# Patient Record
Sex: Male | Born: 1970 | Race: White | Hispanic: No | Marital: Single | Smoking: Former smoker
Health system: Southern US, Community
[De-identification: ages and names within clinical notes are randomized; demographics above are authoritative.]

## PROBLEM LIST (undated history)

## (undated) DIAGNOSIS — J45909 Unspecified asthma, uncomplicated: Secondary | ICD-10-CM

## (undated) DIAGNOSIS — I509 Heart failure, unspecified: Secondary | ICD-10-CM

## (undated) DIAGNOSIS — G43909 Migraine, unspecified, not intractable, without status migrainosus: Secondary | ICD-10-CM

## (undated) DIAGNOSIS — I10 Essential (primary) hypertension: Secondary | ICD-10-CM

## (undated) DIAGNOSIS — I5030 Unspecified diastolic (congestive) heart failure: Secondary | ICD-10-CM

## (undated) DIAGNOSIS — K219 Gastro-esophageal reflux disease without esophagitis: Secondary | ICD-10-CM

## (undated) DIAGNOSIS — F329 Major depressive disorder, single episode, unspecified: Secondary | ICD-10-CM

## (undated) DIAGNOSIS — G629 Polyneuropathy, unspecified: Secondary | ICD-10-CM

## (undated) DIAGNOSIS — E119 Type 2 diabetes mellitus without complications: Secondary | ICD-10-CM

## (undated) DIAGNOSIS — F419 Anxiety disorder, unspecified: Secondary | ICD-10-CM

## (undated) DIAGNOSIS — R12 Heartburn: Secondary | ICD-10-CM

## (undated) DIAGNOSIS — E785 Hyperlipidemia, unspecified: Secondary | ICD-10-CM

## (undated) DIAGNOSIS — G4730 Sleep apnea, unspecified: Secondary | ICD-10-CM

## (undated) DIAGNOSIS — F32A Depression, unspecified: Secondary | ICD-10-CM

## (undated) DIAGNOSIS — N200 Calculus of kidney: Secondary | ICD-10-CM

## (undated) HISTORY — DX: Hyperlipidemia, unspecified: E78.5

## (undated) HISTORY — DX: Heartburn: R12

## (undated) HISTORY — DX: Calculus of kidney: N20.0

## (undated) HISTORY — PX: HERNIA REPAIR: SHX51

## (undated) HISTORY — DX: Polyneuropathy, unspecified: G62.9

## (undated) HISTORY — DX: Gastro-esophageal reflux disease without esophagitis: K21.9

## (undated) HISTORY — DX: Anxiety disorder, unspecified: F41.9

## (undated) HISTORY — DX: Unspecified asthma, uncomplicated: J45.909

## (undated) HISTORY — DX: Sleep apnea, unspecified: G47.30

---

## 2005-05-21 ENCOUNTER — Emergency Department: Payer: Self-pay | Admitting: Emergency Medicine

## 2005-09-15 ENCOUNTER — Emergency Department: Payer: Self-pay | Admitting: Emergency Medicine

## 2006-04-01 ENCOUNTER — Other Ambulatory Visit: Payer: Self-pay

## 2006-04-01 ENCOUNTER — Emergency Department: Payer: Self-pay | Admitting: Internal Medicine

## 2007-01-15 ENCOUNTER — Emergency Department: Payer: Self-pay | Admitting: Emergency Medicine

## 2007-02-22 ENCOUNTER — Emergency Department: Payer: Self-pay | Admitting: Emergency Medicine

## 2007-06-20 HISTORY — PX: KNEE SURGERY: SHX244

## 2007-11-19 ENCOUNTER — Emergency Department: Payer: Self-pay | Admitting: Emergency Medicine

## 2008-06-08 ENCOUNTER — Emergency Department: Payer: Self-pay | Admitting: Emergency Medicine

## 2008-09-04 ENCOUNTER — Emergency Department: Payer: Self-pay | Admitting: Emergency Medicine

## 2008-11-24 ENCOUNTER — Emergency Department: Payer: Self-pay | Admitting: Emergency Medicine

## 2009-01-13 ENCOUNTER — Emergency Department: Payer: Self-pay | Admitting: Emergency Medicine

## 2009-03-05 ENCOUNTER — Emergency Department (HOSPITAL_COMMUNITY): Admission: EM | Admit: 2009-03-05 | Discharge: 2009-03-06 | Payer: Self-pay | Admitting: Emergency Medicine

## 2009-03-05 ENCOUNTER — Encounter: Payer: Self-pay | Admitting: Orthopedic Surgery

## 2009-03-09 ENCOUNTER — Ambulatory Visit: Payer: Self-pay | Admitting: Orthopedic Surgery

## 2009-03-09 DIAGNOSIS — S62319A Displaced fracture of base of unspecified metacarpal bone, initial encounter for closed fracture: Secondary | ICD-10-CM | POA: Insufficient documentation

## 2009-05-05 ENCOUNTER — Encounter: Payer: Self-pay | Admitting: Orthopedic Surgery

## 2009-05-05 ENCOUNTER — Telehealth: Payer: Self-pay | Admitting: Orthopedic Surgery

## 2010-09-23 LAB — URINE MICROSCOPIC-ADD ON

## 2010-09-23 LAB — URINALYSIS, ROUTINE W REFLEX MICROSCOPIC
Glucose, UA: >1000 mg/dL — AB
Hgb urine dipstick: NEGATIVE
Specific Gravity, Urine: <1.005 — ABNORMAL LOW (ref 1.005–1.030)
pH: 5.5 (ref 5.0–8.0)

## 2010-09-23 LAB — GLUCOSE, CAPILLARY
Glucose-Capillary: 270 mg/dL — ABNORMAL HIGH (ref 70–99)
Glucose-Capillary: 325 mg/dL — ABNORMAL HIGH (ref 70–99)
Glucose-Capillary: 359 mg/dL — ABNORMAL HIGH (ref 70–99)

## 2010-09-23 LAB — CBC
MCV: 90.9 fL (ref 78.0–100.0)
RBC: 4.84 MIL/uL (ref 4.22–5.81)
WBC: 10.3 10*3/uL (ref 4.0–10.5)

## 2010-09-23 LAB — DIFFERENTIAL
Lymphocytes Relative: 37 % (ref 12–46)
Lymphs Abs: 3.8 10*3/uL (ref 0.7–4.0)
Monocytes Relative: 5 % (ref 3–12)
Neutro Abs: 5.8 10*3/uL (ref 1.7–7.7)
Neutrophils Relative %: 56 % (ref 43–77)

## 2010-09-23 LAB — BASIC METABOLIC PANEL
Calcium: 9.3 mg/dL (ref 8.4–10.5)
Chloride: 90 mEq/L — ABNORMAL LOW (ref 96–112)
Creatinine, Ser: 1.07 mg/dL (ref 0.4–1.5)
GFR calc Af Amer: >60 mL/min (ref >60)

## 2010-11-25 ENCOUNTER — Emergency Department: Payer: Self-pay | Admitting: Emergency Medicine

## 2011-06-29 ENCOUNTER — Emergency Department: Payer: Self-pay | Admitting: *Deleted

## 2011-08-30 ENCOUNTER — Emergency Department: Payer: Self-pay | Admitting: Internal Medicine

## 2011-08-30 LAB — URINALYSIS, COMPLETE
Ketone: NEGATIVE
Leukocyte Esterase: NEGATIVE
Nitrite: NEGATIVE
Protein: NEGATIVE
RBC,UR: <1 /HPF (ref 0–5)
WBC UR: 2 /HPF (ref 0–5)

## 2011-08-30 LAB — COMPREHENSIVE METABOLIC PANEL
Albumin: 3.9 g/dL (ref 3.4–5.0)
Alkaline Phosphatase: 89 U/L (ref 50–136)
Anion Gap: 11 (ref 7–16)
BUN: 12 mg/dL (ref 7–18)
Bilirubin,Total: 0.4 mg/dL (ref 0.2–1.0)
Calcium, Total: 8.6 mg/dL (ref 8.5–10.1)
Creatinine: 1.16 mg/dL (ref 0.60–1.30)
EGFR (African American): >60
Glucose: 88 mg/dL (ref 65–99)
Potassium: 3.8 mmol/L (ref 3.5–5.1)
Sodium: 139 mmol/L (ref 136–145)
Total Protein: 7.8 g/dL (ref 6.4–8.2)

## 2011-08-30 LAB — CBC
HCT: 45 % (ref 40.0–52.0)
HGB: 15.4 g/dL (ref 13.0–18.0)
MCV: 94 fL (ref 80–100)
WBC: 11.2 10*3/uL — ABNORMAL HIGH (ref 3.8–10.6)

## 2011-08-30 LAB — RAPID INFLUENZA A&B ANTIGENS

## 2011-09-05 LAB — CULTURE, BLOOD (SINGLE)

## 2011-11-08 ENCOUNTER — Emergency Department: Payer: Self-pay | Admitting: *Deleted

## 2011-11-08 LAB — TROPONIN I: Troponin-I: <0.02 ng/mL

## 2011-11-08 LAB — CBC
HGB: 16 g/dL (ref 13.0–18.0)
MCH: 31.9 pg (ref 26.0–34.0)
MCV: 92 fL (ref 80–100)
RBC: 5 10*6/uL (ref 4.40–5.90)
RDW: 12.8 % (ref 11.5–14.5)
WBC: 12.4 10*3/uL — ABNORMAL HIGH (ref 3.8–10.6)

## 2011-11-08 LAB — BASIC METABOLIC PANEL
Anion Gap: 9 (ref 7–16)
Calcium, Total: 8.5 mg/dL (ref 8.5–10.1)
Chloride: 105 mmol/L (ref 98–107)
Co2: 25 mmol/L (ref 21–32)
EGFR (African American): >60
Osmolality: 281 (ref 275–301)

## 2011-11-08 LAB — CK TOTAL AND CKMB (NOT AT ARMC)
CK, Total: 187 U/L (ref 35–232)
CK-MB: 0.9 ng/mL (ref 0.5–3.6)

## 2011-11-25 ENCOUNTER — Emergency Department: Payer: Self-pay | Admitting: Emergency Medicine

## 2011-11-25 LAB — CBC
HCT: 48.1 % (ref 40.0–52.0)
MCHC: 34.4 g/dL (ref 32.0–36.0)
MCV: 94 fL (ref 80–100)
RBC: 5.15 10*6/uL (ref 4.40–5.90)
RDW: 13.5 % (ref 11.5–14.5)
WBC: 13.5 10*3/uL — ABNORMAL HIGH (ref 3.8–10.6)

## 2011-11-25 LAB — URINALYSIS, COMPLETE
Bacteria: NONE SEEN
Bilirubin,UR: NEGATIVE
Glucose,UR: NEGATIVE mg/dL (ref 0–75)
Ketone: NEGATIVE
Nitrite: NEGATIVE
Ph: 5 (ref 4.5–8.0)
RBC,UR: 3 /HPF (ref 0–5)
Specific Gravity: 1.021 (ref 1.003–1.030)
WBC UR: 5 /HPF (ref 0–5)

## 2011-11-26 LAB — COMPREHENSIVE METABOLIC PANEL
Alkaline Phosphatase: 115 U/L (ref 50–136)
BUN: 12 mg/dL (ref 7–18)
Creatinine: 1.28 mg/dL (ref 0.60–1.30)
Osmolality: 278 (ref 275–301)
Sodium: 139 mmol/L (ref 136–145)

## 2011-11-26 LAB — CK TOTAL AND CKMB (NOT AT ARMC): CK-MB: <0.5 ng/mL — ABNORMAL LOW (ref 0.5–3.6)

## 2012-01-28 ENCOUNTER — Emergency Department: Payer: Self-pay | Admitting: Emergency Medicine

## 2012-01-28 LAB — CBC WITH DIFFERENTIAL/PLATELET
Basophil %: 1.3 %
Eosinophil %: 1.9 %
HCT: 51.2 % (ref 40.0–52.0)
HGB: 17.7 g/dL (ref 13.0–18.0)
Lymphocyte %: 39.8 %
Neutrophil #: 5.4 10*3/uL (ref 1.4–6.5)
Neutrophil %: 50 %
RBC: 5.57 10*6/uL (ref 4.40–5.90)
WBC: 10.8 10*3/uL — ABNORMAL HIGH (ref 3.8–10.6)

## 2012-01-28 LAB — COMPREHENSIVE METABOLIC PANEL
Bilirubin,Total: 0.3 mg/dL (ref 0.2–1.0)
Calcium, Total: 8.9 mg/dL (ref 8.5–10.1)
Chloride: 103 mmol/L (ref 98–107)
EGFR (African American): >60
EGFR (Non-African Amer.): >60
Glucose: 142 mg/dL — ABNORMAL HIGH (ref 65–99)
Osmolality: 277 (ref 275–301)
Potassium: 3.9 mmol/L (ref 3.5–5.1)
SGOT(AST): 31 U/L (ref 15–37)
SGPT (ALT): 43 U/L (ref 12–78)
Total Protein: 8.6 g/dL — ABNORMAL HIGH (ref 6.4–8.2)

## 2012-04-17 ENCOUNTER — Ambulatory Visit: Payer: Self-pay

## 2012-06-10 IMAGING — CR DG KNEE COMPLETE 4+V*R*
1 series · 5 of 5 positions shown · non-contrast
Comparison: none

REASON FOR EXAM: pain lateral/posterior s/p Squatting and felt "pop"
COMMENTS:   May transport without cardiac monitor

PROCEDURE:     DXR - DXR KNEE RT COMP WITH OBLIQUES  - June 29, 2011 [DATE]
RESULT:     No fracture, dislocation or other acute bony abnormality is
identified. The knee joint space is well maintained. The patella is intact.

[Series 1: t knee ap right · 0.14mm/px · 5 of 5 slices shown]
[im 1/5]
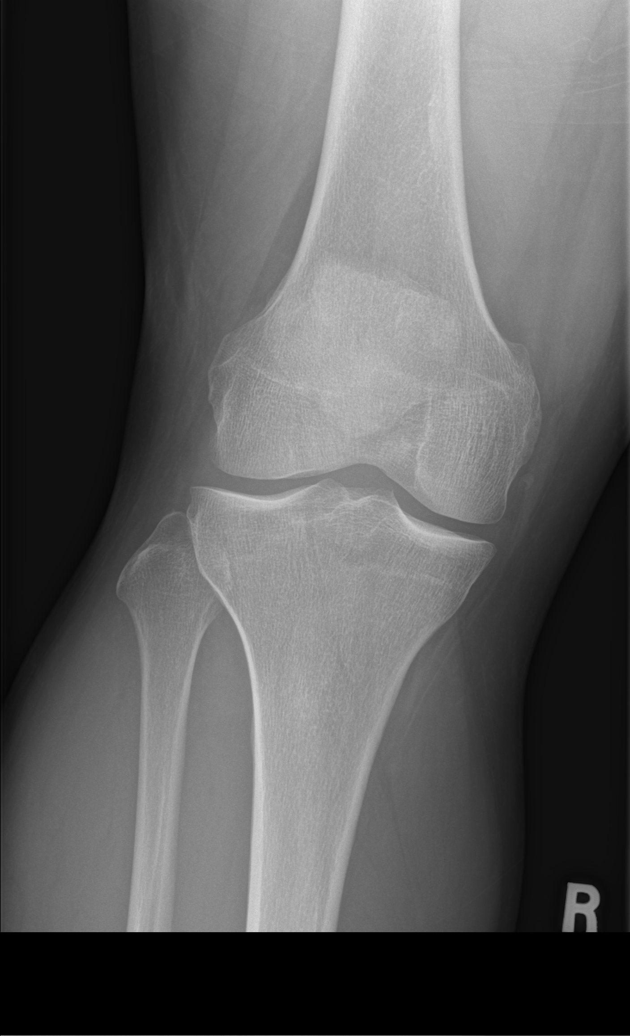
[im 2/5]
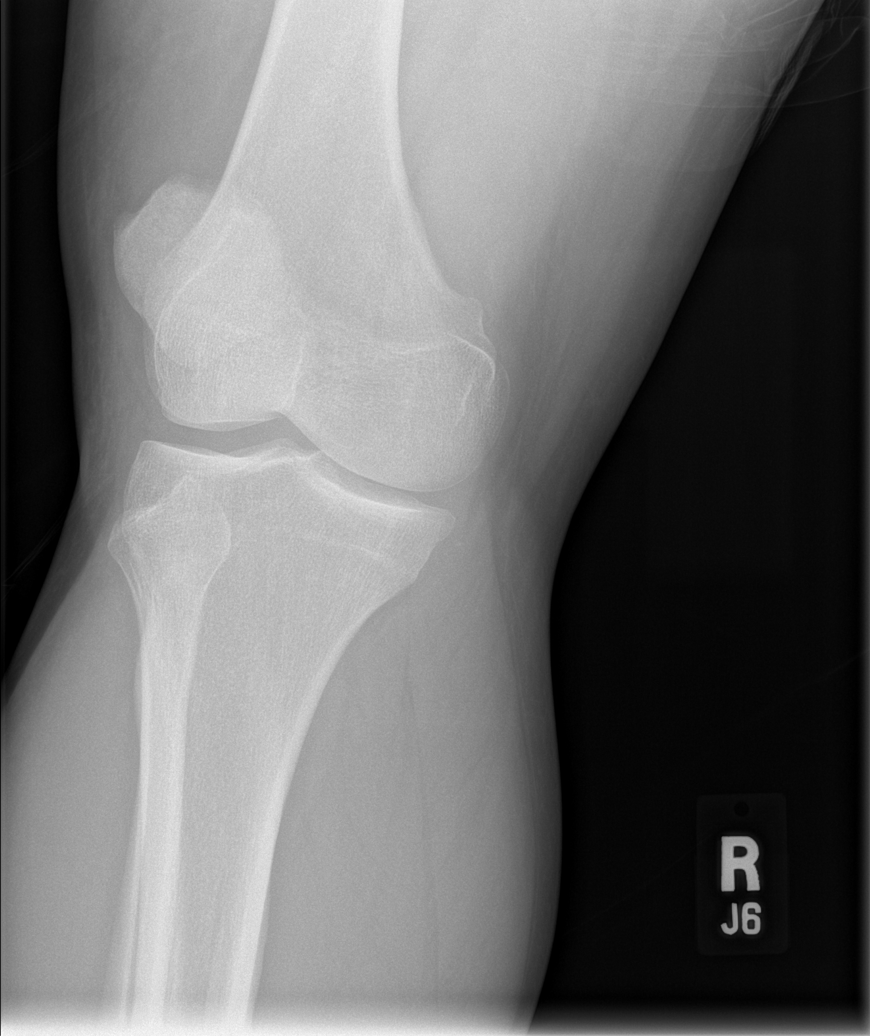
[im 3/5]
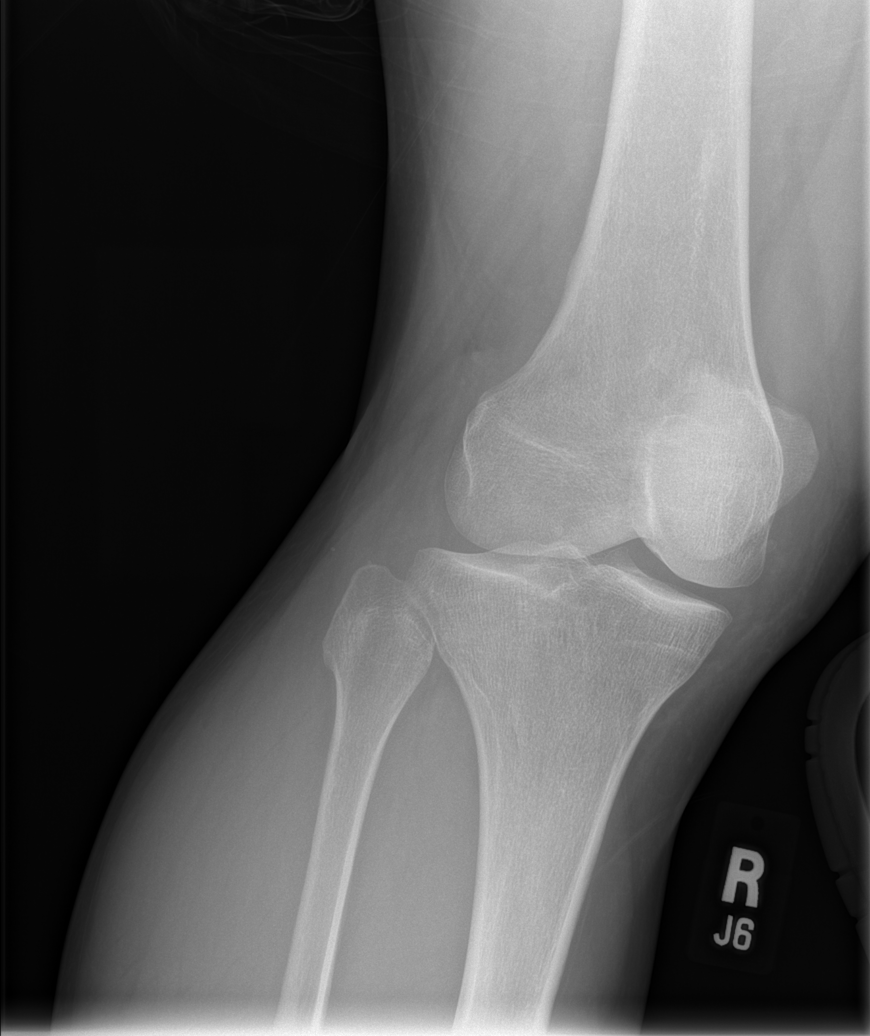
[im 4/5]
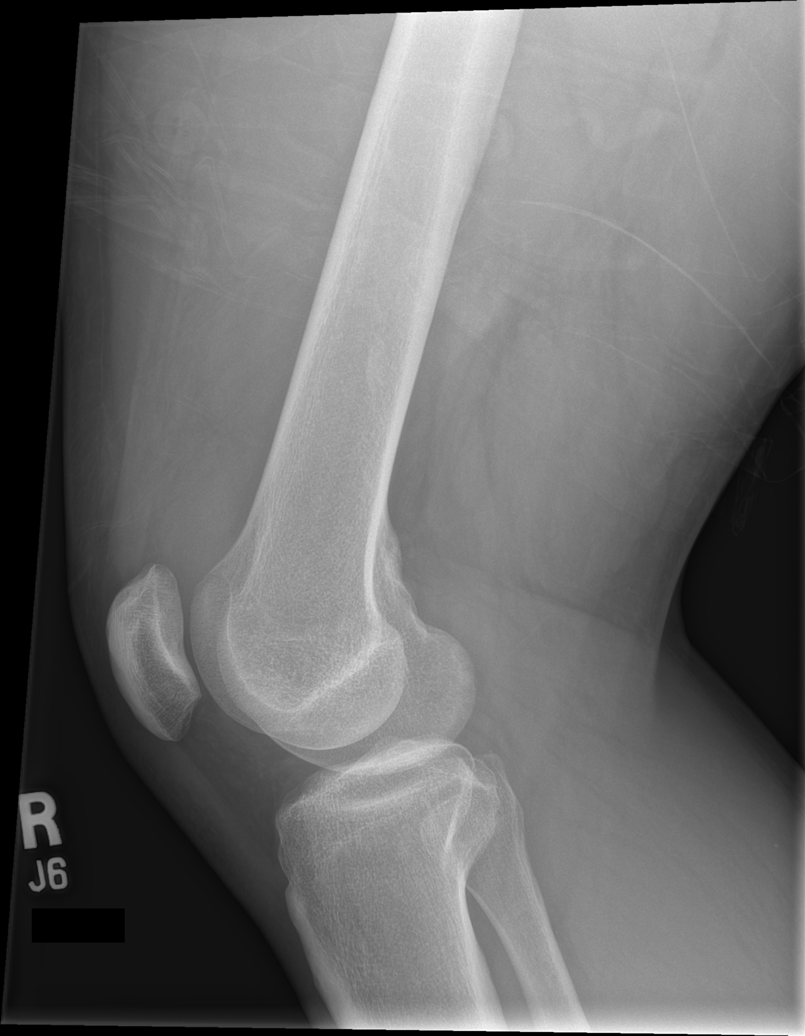
[im 5/5]
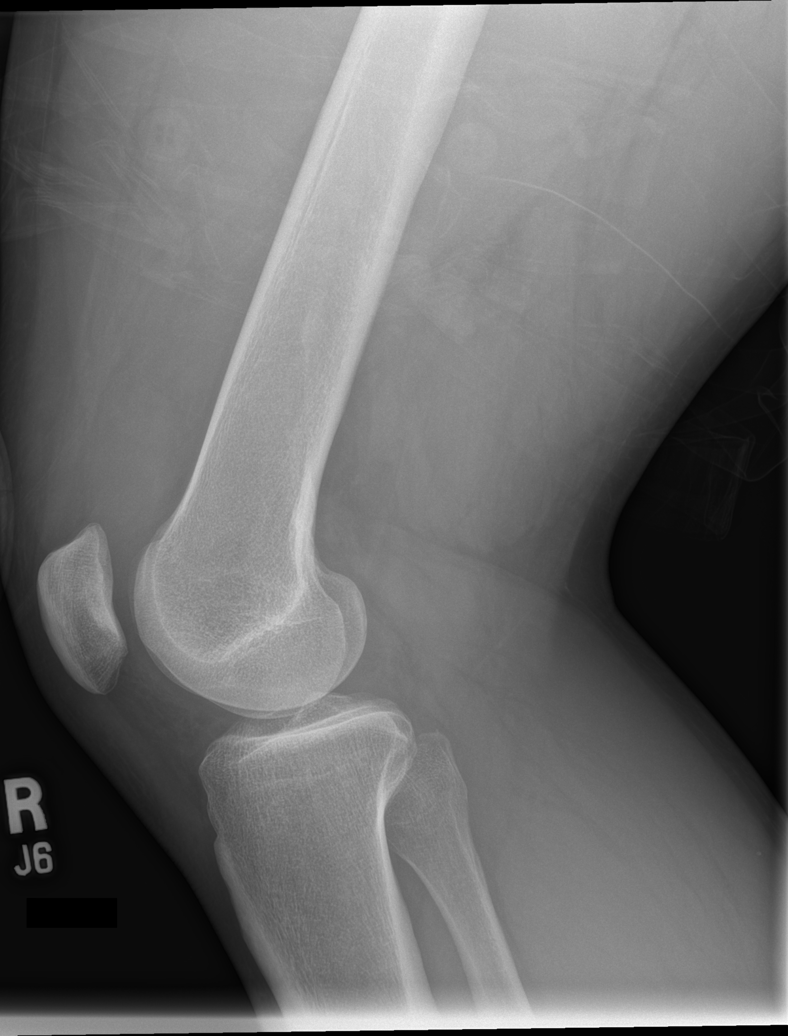

[5 of 5 positions shown; findings below may reference images not displayed]

IMPRESSION: 1.     No significant abnormalities are noted.

## 2012-09-19 ENCOUNTER — Emergency Department: Payer: Self-pay | Admitting: Emergency Medicine

## 2012-09-19 LAB — URINALYSIS, COMPLETE
Bilirubin,UR: NEGATIVE
Blood: NEGATIVE
Glucose,UR: >=500 mg/dL (ref 0–75)
Leukocyte Esterase: NEGATIVE
Specific Gravity: 1.035 (ref 1.003–1.030)
Squamous Epithelial: NONE SEEN

## 2012-09-19 LAB — COMPREHENSIVE METABOLIC PANEL
Albumin: 3.9 g/dL (ref 3.4–5.0)
Alkaline Phosphatase: 152 U/L — ABNORMAL HIGH (ref 50–136)
BUN: 16 mg/dL (ref 7–18)
Creatinine: 1.41 mg/dL — ABNORMAL HIGH (ref 0.60–1.30)
EGFR (Non-African Amer.): >60
Osmolality: 278 (ref 275–301)
Potassium: 3.9 mmol/L (ref 3.5–5.1)
SGOT(AST): 22 U/L (ref 15–37)
SGPT (ALT): 32 U/L (ref 12–78)
Sodium: 128 mmol/L — ABNORMAL LOW (ref 136–145)

## 2012-09-19 LAB — CBC WITH DIFFERENTIAL/PLATELET
Basophil #: 0 10*3/uL (ref 0.0–0.1)
Basophil %: 0.3 %
HGB: 17.7 g/dL (ref 13.0–18.0)
MCH: 31.8 pg (ref 26.0–34.0)
MCHC: 35 g/dL (ref 32.0–36.0)
Monocyte #: 0.6 x10 3/mm (ref 0.2–1.0)
Monocyte %: 4.2 %
Neutrophil #: 13.5 10*3/uL — ABNORMAL HIGH (ref 1.4–6.5)
Platelet: 197 10*3/uL (ref 150–440)
RBC: 5.58 10*6/uL (ref 4.40–5.90)
RDW: 12.4 % (ref 11.5–14.5)

## 2013-02-19 ENCOUNTER — Emergency Department: Payer: Self-pay | Admitting: Emergency Medicine

## 2013-02-19 LAB — BASIC METABOLIC PANEL
Creatinine: 1.38 mg/dL — ABNORMAL HIGH (ref 0.60–1.30)
EGFR (African American): >60
Glucose: 138 mg/dL — ABNORMAL HIGH (ref 65–99)
Osmolality: 274 (ref 275–301)
Potassium: 3.6 mmol/L (ref 3.5–5.1)

## 2013-02-19 LAB — CBC
HGB: 15.8 g/dL (ref 13.0–18.0)
MCHC: 35.2 g/dL (ref 32.0–36.0)
Platelet: 221 10*3/uL (ref 150–440)
RBC: 4.97 10*6/uL (ref 4.40–5.90)
WBC: 10.4 10*3/uL (ref 3.8–10.6)

## 2013-02-19 LAB — URINALYSIS, COMPLETE
Bacteria: NONE SEEN
Glucose,UR: NEGATIVE mg/dL (ref 0–75)
Ketone: NEGATIVE
Leukocyte Esterase: NEGATIVE
Nitrite: NEGATIVE
Squamous Epithelial: NONE SEEN

## 2013-03-28 ENCOUNTER — Ambulatory Visit: Payer: Self-pay | Admitting: Surgery

## 2013-03-28 LAB — COMPREHENSIVE METABOLIC PANEL
Albumin: 3.7 g/dL (ref 3.4–5.0)
Alkaline Phosphatase: 96 U/L (ref 50–136)
BUN: 13 mg/dL (ref 7–18)
Co2: 26 mmol/L (ref 21–32)
Creatinine: 1.19 mg/dL (ref 0.60–1.30)
EGFR (Non-African Amer.): >60
Potassium: 3.9 mmol/L (ref 3.5–5.1)
Sodium: 135 mmol/L — ABNORMAL LOW (ref 136–145)
Total Protein: 7 g/dL (ref 6.4–8.2)

## 2013-03-28 LAB — CBC WITH DIFFERENTIAL/PLATELET
Basophil %: 1 %
Eosinophil #: 0.2 10*3/uL (ref 0.0–0.7)
Eosinophil %: 2 %
HCT: 44.2 % (ref 40.0–52.0)
HGB: 15.5 g/dL (ref 13.0–18.0)
Lymphocyte %: 43.8 %
MCH: 31.5 pg (ref 26.0–34.0)
MCHC: 35.1 g/dL (ref 32.0–36.0)
Monocyte #: 0.6 x10 3/mm (ref 0.2–1.0)
Neutrophil #: 4.2 10*3/uL (ref 1.4–6.5)
Neutrophil %: 46.5 %
Platelet: 205 10*3/uL (ref 150–440)
RDW: 12.9 % (ref 11.5–14.5)

## 2013-03-31 ENCOUNTER — Ambulatory Visit: Payer: Self-pay | Admitting: Surgery

## 2013-04-03 ENCOUNTER — Other Ambulatory Visit: Payer: Self-pay | Admitting: Surgery

## 2013-04-03 LAB — URINALYSIS, COMPLETE
Glucose,UR: 50 mg/dL (ref 0–75)
Ketone: NEGATIVE
Leukocyte Esterase: NEGATIVE
Nitrite: NEGATIVE
RBC,UR: <1 /HPF (ref 0–5)
WBC UR: 1 /HPF (ref 0–5)

## 2013-06-19 ENCOUNTER — Emergency Department: Payer: Self-pay | Admitting: Emergency Medicine

## 2013-06-19 LAB — CBC
HCT: 48.9 % (ref 40.0–52.0)
HGB: 16.7 g/dL (ref 13.0–18.0)
MCH: 30.7 pg (ref 26.0–34.0)
MCHC: 34.3 g/dL (ref 32.0–36.0)
MCV: 90 fL (ref 80–100)
PLATELETS: 265 10*3/uL (ref 150–440)
RBC: 5.45 10*6/uL (ref 4.40–5.90)
RDW: 12.9 % (ref 11.5–14.5)
WBC: 11.4 10*3/uL — ABNORMAL HIGH (ref 3.8–10.6)

## 2013-06-19 LAB — URINALYSIS, COMPLETE
BACTERIA: NONE SEEN
BILIRUBIN, UR: NEGATIVE
Glucose,UR: NEGATIVE mg/dL (ref 0–75)
Hyaline Cast: 1
Leukocyte Esterase: NEGATIVE
NITRITE: NEGATIVE
Ph: 5 (ref 4.5–8.0)
Protein: NEGATIVE
RBC,UR: 8 /HPF (ref 0–5)
Specific Gravity: 1.016 (ref 1.003–1.030)
Squamous Epithelial: NONE SEEN

## 2013-06-19 LAB — COMPREHENSIVE METABOLIC PANEL
ALK PHOS: 96 U/L
AST: 31 U/L (ref 15–37)
Albumin: 4.4 g/dL (ref 3.4–5.0)
Anion Gap: 5 — ABNORMAL LOW (ref 7–16)
BUN: 22 mg/dL — AB (ref 7–18)
Bilirubin,Total: 0.7 mg/dL (ref 0.2–1.0)
CHLORIDE: 101 mmol/L (ref 98–107)
CO2: 28 mmol/L (ref 21–32)
Calcium, Total: 9.4 mg/dL (ref 8.5–10.1)
Creatinine: 1.5 mg/dL — ABNORMAL HIGH (ref 0.60–1.30)
GFR CALC NON AF AMER: 57 — AB
Glucose: 100 mg/dL — ABNORMAL HIGH (ref 65–99)
OSMOLALITY: 272 (ref 275–301)
Potassium: 3.9 mmol/L (ref 3.5–5.1)
SGPT (ALT): 38 U/L (ref 12–78)
Sodium: 134 mmol/L — ABNORMAL LOW (ref 136–145)
TOTAL PROTEIN: 8.6 g/dL — AB (ref 6.4–8.2)

## 2013-06-19 LAB — LIPASE, BLOOD: Lipase: 170 U/L (ref 73–393)

## 2013-07-11 ENCOUNTER — Emergency Department: Payer: Self-pay | Admitting: Internal Medicine

## 2013-11-30 ENCOUNTER — Emergency Department: Payer: Self-pay | Admitting: Internal Medicine

## 2013-11-30 LAB — LIPASE, BLOOD: Lipase: 243 U/L (ref 73–393)

## 2013-11-30 LAB — COMPREHENSIVE METABOLIC PANEL
ALBUMIN: 3.9 g/dL (ref 3.4–5.0)
AST: 20 U/L (ref 15–37)
Alkaline Phosphatase: 111 U/L
Anion Gap: 6 — ABNORMAL LOW (ref 7–16)
BILIRUBIN TOTAL: 0.2 mg/dL (ref 0.2–1.0)
BUN: 17 mg/dL (ref 7–18)
CALCIUM: 8.7 mg/dL (ref 8.5–10.1)
Chloride: 105 mmol/L (ref 98–107)
Co2: 25 mmol/L (ref 21–32)
Creatinine: 1.35 mg/dL — ABNORMAL HIGH (ref 0.60–1.30)
EGFR (Non-African Amer.): >60
Glucose: 197 mg/dL — ABNORMAL HIGH (ref 65–99)
Osmolality: 279 (ref 275–301)
Potassium: 4 mmol/L (ref 3.5–5.1)
SGPT (ALT): 27 U/L (ref 12–78)
Sodium: 136 mmol/L (ref 136–145)
Total Protein: 7.7 g/dL (ref 6.4–8.2)

## 2013-11-30 LAB — CBC
HCT: 49.8 % (ref 40.0–52.0)
HGB: 17 g/dL (ref 13.0–18.0)
MCH: 31.1 pg (ref 26.0–34.0)
MCHC: 34.1 g/dL (ref 32.0–36.0)
MCV: 91 fL (ref 80–100)
Platelet: 257 10*3/uL (ref 150–440)
RBC: 5.47 10*6/uL (ref 4.40–5.90)
RDW: 12.7 % (ref 11.5–14.5)
WBC: 9.8 10*3/uL (ref 3.8–10.6)

## 2013-11-30 LAB — TROPONIN I: Troponin-I: <0.02 ng/mL

## 2013-12-01 ENCOUNTER — Emergency Department: Payer: Self-pay | Admitting: Emergency Medicine

## 2013-12-01 LAB — URINALYSIS, COMPLETE
BACTERIA: NONE SEEN
BILIRUBIN, UR: NEGATIVE
GLUCOSE, UR: NEGATIVE mg/dL (ref 0–75)
Ketone: NEGATIVE
Leukocyte Esterase: NEGATIVE
Nitrite: NEGATIVE
Ph: 6 (ref 4.5–8.0)
Protein: NEGATIVE
RBC,UR: 24 /HPF (ref 0–5)
SPECIFIC GRAVITY: 1.012 (ref 1.003–1.030)
Squamous Epithelial: NONE SEEN

## 2014-03-03 ENCOUNTER — Emergency Department: Payer: Self-pay | Admitting: Emergency Medicine

## 2014-03-03 LAB — CBC
HCT: 45.4 % (ref 40.0–52.0)
HGB: 15.2 g/dL (ref 13.0–18.0)
MCH: 30.9 pg (ref 26.0–34.0)
MCHC: 33.6 g/dL (ref 32.0–36.0)
MCV: 92 fL (ref 80–100)
PLATELETS: 223 10*3/uL (ref 150–440)
RBC: 4.93 10*6/uL (ref 4.40–5.90)
RDW: 12.6 % (ref 11.5–14.5)
WBC: 7.8 10*3/uL (ref 3.8–10.6)

## 2014-03-03 LAB — COMPREHENSIVE METABOLIC PANEL
ALT: 33 U/L
Albumin: 3.7 g/dL (ref 3.4–5.0)
Alkaline Phosphatase: 107 U/L
Anion Gap: 7 (ref 7–16)
BILIRUBIN TOTAL: 0.3 mg/dL (ref 0.2–1.0)
BUN: 12 mg/dL (ref 7–18)
CHLORIDE: 107 mmol/L (ref 98–107)
CREATININE: 1.26 mg/dL (ref 0.60–1.30)
Calcium, Total: 8.4 mg/dL — ABNORMAL LOW (ref 8.5–10.1)
Co2: 25 mmol/L (ref 21–32)
EGFR (African American): >60
EGFR (Non-African Amer.): >60
Glucose: 123 mg/dL — ABNORMAL HIGH (ref 65–99)
Osmolality: 279 (ref 275–301)
Potassium: 4 mmol/L (ref 3.5–5.1)
SGOT(AST): 30 U/L (ref 15–37)
SODIUM: 139 mmol/L (ref 136–145)
Total Protein: 7.4 g/dL (ref 6.4–8.2)

## 2014-03-03 LAB — URINALYSIS, COMPLETE
BILIRUBIN, UR: NEGATIVE
Bacteria: NONE SEEN
Glucose,UR: NEGATIVE mg/dL (ref 0–75)
Ketone: NEGATIVE
Leukocyte Esterase: NEGATIVE
Nitrite: NEGATIVE
Ph: 5 (ref 4.5–8.0)
Protein: 100
Specific Gravity: 1.025 (ref 1.003–1.030)
WBC UR: 2 /HPF (ref 0–5)

## 2014-03-07 ENCOUNTER — Emergency Department: Payer: Self-pay

## 2014-03-07 LAB — COMPREHENSIVE METABOLIC PANEL
ALBUMIN: 4 g/dL (ref 3.4–5.0)
ALK PHOS: 102 U/L
AST: 28 U/L (ref 15–37)
Anion Gap: 10 (ref 7–16)
BILIRUBIN TOTAL: 0.4 mg/dL (ref 0.2–1.0)
BUN: 20 mg/dL — AB (ref 7–18)
CHLORIDE: 105 mmol/L (ref 98–107)
CO2: 23 mmol/L (ref 21–32)
CREATININE: 1.52 mg/dL — AB (ref 0.60–1.30)
Calcium, Total: 8.7 mg/dL (ref 8.5–10.1)
GFR CALC NON AF AMER: 55 — AB
Glucose: 248 mg/dL — ABNORMAL HIGH (ref 65–99)
Osmolality: 287 (ref 275–301)
Potassium: 3.8 mmol/L (ref 3.5–5.1)
SGPT (ALT): 30 U/L
Sodium: 138 mmol/L (ref 136–145)
Total Protein: 7.7 g/dL (ref 6.4–8.2)

## 2014-03-07 LAB — CBC WITH DIFFERENTIAL/PLATELET
BASOS PCT: 1.1 %
Basophil #: 0.1 10*3/uL (ref 0.0–0.1)
Eosinophil #: 0.1 10*3/uL (ref 0.0–0.7)
Eosinophil %: 0.7 %
HCT: 45.1 % (ref 40.0–52.0)
HGB: 15 g/dL (ref 13.0–18.0)
Lymphocyte #: 2.8 10*3/uL (ref 1.0–3.6)
Lymphocyte %: 36.9 %
MCH: 30.5 pg (ref 26.0–34.0)
MCHC: 33.2 g/dL (ref 32.0–36.0)
MCV: 92 fL (ref 80–100)
MONO ABS: 0.5 x10 3/mm (ref 0.2–1.0)
Monocyte %: 6.4 %
NEUTROS PCT: 54.9 %
Neutrophil #: 4.2 10*3/uL (ref 1.4–6.5)
Platelet: 239 10*3/uL (ref 150–440)
RBC: 4.91 10*6/uL (ref 4.40–5.90)
RDW: 12.8 % (ref 11.5–14.5)
WBC: 7.6 10*3/uL (ref 3.8–10.6)

## 2014-03-07 LAB — URINALYSIS, COMPLETE
BACTERIA: NONE SEEN
BILIRUBIN, UR: NEGATIVE
KETONE: NEGATIVE
LEUKOCYTE ESTERASE: NEGATIVE
Nitrite: NEGATIVE
Ph: 5 (ref 4.5–8.0)
Protein: 100
SQUAMOUS EPITHELIAL: NONE SEEN
Specific Gravity: 1.029 (ref 1.003–1.030)

## 2014-03-07 LAB — LIPASE, BLOOD: Lipase: 145 U/L (ref 73–393)

## 2014-06-17 ENCOUNTER — Emergency Department: Payer: Self-pay | Admitting: Emergency Medicine

## 2014-10-10 NOTE — Consult Note (Signed)
PATIENT NAME:  Dustin Williamson, BRULL MR#:  [id number] DATE OF BIRTH:  Apr 28, 1971  DATE OF CONSULTATION:  06/19/2013  REFERRING PHYSICIAN:   CONSULTING PHYSICIAN:  Quentin Ore III, MD  PRIMARY CARE PHYSICIAN: Dr. Juel Burrow.    CHIEF COMPLAINT: Left inguinal, left lower quadrant pain.   BRIEF HISTORY: Dustin Williamson is a 44 year old gentleman who underwent a left inguinal hernia repair in October of 2014. The procedure was performed by one of my associates, Dr. Dionne Milo. An extraperitoneal procedure was performed. The patient had large indirect and small direct defects with a large cord lipoma. The patient had some prolonged postoperative discomfort, with 2 followup visits with inguinal discomfort but his symptoms resolved and he required no further intervention. He has not been seen since November of 2014.   He noted the fairly sudden onset of left lower quadrant pain yesterday after a hard stool. The pain was primarily left lower quadrant, left groin. He had some tenderness palpation in the left groin. He denied any bulge or mass in that area. The pain actually went into his scrotum on that side. He began to develop pain along the medial aspect of his leg, all the way down into his foot with some numbness in his foot. He had some throbbing discomfort when he was standing. He came to the Emergency Room today for further evaluation because he is concerned about the possibility of a recurrent hernia or a complication from his hernia procedure.   He denies any other significant GI problems. He denies any history of hepatitis, yellow jaundice, pancreatitis, peptic ulcer disease, gallbladder disease or diverticulitis. He has not had any other stool problems recently. He has not had any colonoscopy or endoscopic workup. His only abdominal surgery was his left groin surgery. He has had some right knee surgery.   MEDICAL PROBLEMS: Involve hypertension, nephrolithiasis and adult onset diabetes with oral agents,  noninsulin dependent.   MEDICATIONS: Include propranolol 80 mg twice a day, Xanax 0.5 mg once a day, omeprazole 20 mg b.i.d. and metformin 1000 mg b.i.d.   ALLERGIES: He has no medical allergies.   REVIEW OF SYSTEMS: Otherwise unremarkable.   FAMILY HISTORY: Noncontributory.   LABORATORY VALUES: Reveal a slightly elevated creatinine at 1.5, slightly elevated BUN at 22, elevated white blood cell count at 11,400.   Because of this discomfort, the surgical service was consulted for further evaluation of possible recurrent hernia or hernia complication. No imaging has been performed.   PHYSICAL EXAMINATION:  GENERAL: He is an alert gentleman lying in bed, appears comfortable, although anxious. Pain scale is a 5.  VITAL SIGNS: His temperature is normal. Blood pressure 140/88. Heart rate is 84 and regular.  HEENT: No scleral icterus. No pupillary abnormalities. No facial deformities.  NECK: Supple, nontender with no adenopathy. Midline trachea.  CHEST: Has normal pulmonary excursion with no adventitious sounds.  CARDIAC: No murmurs or gallops. He seems to be in normal sinus rhythm.  ABDOMEN: Soft and nontender. He does have some point tenderness in the left inguinal area. He does not have any left lower quadrant tenderness. He has no rebound or guarding. Inverting his scrotum, I cannot palpate any inguinal defects, and there are no bulges or impulses noted in either groin. The scrotum is not enlarged, with no evidence of any swelling or discomfort.  LEFT LOWER EXTREMITY: His left lower extremity is otherwise unremarkable. He has normal sensation on exam. He has full range of motion. No deformities. Good distal pulse.  PSYCHIATRIC: Reveals  some mild anxiousness but no orientation issues.   IMPRESSION: I do not believe this gentleman has any symptoms consistent with a recurrent or persistent hernia. He has really not had any neurologic symptoms associated with his hernia, and I suspect that his  discomfort is related to a musculoskeletal or inflammatory issue. He does have symptoms in his entire leg which would not be consistent with a recurrent or persistent hernia. I would recommend treatment with pain medicine, anti-inflammatory drugs and cathartics for his bowel function. We would arrange for followup as an outpatient as necessary.   ____________________________ Quentin Orealph L. Ely III, MD rle:gb D: 06/19/2013 17:39:33 ET T: 06/19/2013 18:17:18 ET JOB#: [id number]  cc: Quentin Orealph L. Ely III, MD, <Dictator> Corky DownsJaved [name], MD Quentin OreALPH L ELY MD ELECTRONICALLY SIGNED 06/24/2013 20:36

## 2014-10-16 ENCOUNTER — Emergency Department
Admit: 2014-10-16 | Disposition: A | Discharge status: Home / Self Care | Payer: Self-pay | Admitting: Emergency Medicine

## 2014-10-16 LAB — COMPREHENSIVE METABOLIC PANEL
ALK PHOS: 85 U/L
ALT: 23 U/L
Albumin: 4.7 g/dL
Anion Gap: 7 (ref 7–16)
BILIRUBIN TOTAL: 0.4 mg/dL
BUN: 17 mg/dL
CALCIUM: 8.8 mg/dL — AB
CREATININE: 1.63 mg/dL — AB
Chloride: 104 mmol/L
Co2: 22 mmol/L
EGFR (African American): 59 — ABNORMAL LOW
GFR CALC NON AF AMER: 50 — AB
Glucose: 120 mg/dL — ABNORMAL HIGH
Potassium: 3.7 mmol/L
SGOT(AST): 23 U/L
Sodium: 133 mmol/L — ABNORMAL LOW
TOTAL PROTEIN: 7.9 g/dL

## 2014-10-16 LAB — URINALYSIS, COMPLETE
Bilirubin,UR: NEGATIVE
Glucose,UR: NEGATIVE mg/dL (ref 0–75)
KETONE: NEGATIVE
Leukocyte Esterase: NEGATIVE
Nitrite: NEGATIVE
Ph: 5 (ref 4.5–8.0)
Specific Gravity: 1.03 (ref 1.003–1.030)
Squamous Epithelial: NONE SEEN

## 2014-10-16 LAB — CBC WITH DIFFERENTIAL/PLATELET
BASOS ABS: 0.1 10*3/uL (ref 0.0–0.1)
Basophil %: 0.7 %
EOS ABS: 0.1 10*3/uL (ref 0.0–0.7)
EOS PCT: 1.4 %
HCT: 48.7 % (ref 40.0–52.0)
HGB: 16.4 g/dL (ref 13.0–18.0)
LYMPHS ABS: 4.2 10*3/uL — AB (ref 1.0–3.6)
Lymphocyte %: 43.8 %
MCH: 30.8 pg (ref 26.0–34.0)
MCHC: 33.8 g/dL (ref 32.0–36.0)
MCV: 91 fL (ref 80–100)
Monocyte #: 0.8 x10 3/mm (ref 0.2–1.0)
Monocyte %: 7.9 %
Neutrophil #: 4.4 10*3/uL (ref 1.4–6.5)
Neutrophil %: 46.2 %
PLATELETS: 219 10*3/uL (ref 150–440)
RBC: 5.34 10*6/uL (ref 4.40–5.90)
RDW: 12.8 % (ref 11.5–14.5)
WBC: 9.5 10*3/uL (ref 3.8–10.6)

## 2015-02-15 ENCOUNTER — Emergency Department
Admission: EM | Admit: 2015-02-15 | Discharge: 2015-02-15 | Disposition: A | Discharge status: Home / Self Care | Payer: Self-pay | Attending: Emergency Medicine | Admitting: Emergency Medicine

## 2015-02-15 ENCOUNTER — Encounter: Payer: Self-pay | Admitting: Emergency Medicine

## 2015-02-15 DIAGNOSIS — I10 Essential (primary) hypertension: Secondary | ICD-10-CM | POA: Insufficient documentation

## 2015-02-15 DIAGNOSIS — R109 Unspecified abdominal pain: Secondary | ICD-10-CM

## 2015-02-15 DIAGNOSIS — E1165 Type 2 diabetes mellitus with hyperglycemia: Secondary | ICD-10-CM | POA: Insufficient documentation

## 2015-02-15 DIAGNOSIS — R739 Hyperglycemia, unspecified: Secondary | ICD-10-CM

## 2015-02-15 DIAGNOSIS — R197 Diarrhea, unspecified: Secondary | ICD-10-CM | POA: Insufficient documentation

## 2015-02-15 DIAGNOSIS — R1084 Generalized abdominal pain: Secondary | ICD-10-CM | POA: Insufficient documentation

## 2015-02-15 HISTORY — DX: Essential (primary) hypertension: I10

## 2015-02-15 HISTORY — DX: Major depressive disorder, single episode, unspecified: F32.9

## 2015-02-15 HISTORY — DX: Type 2 diabetes mellitus without complications: E11.9

## 2015-02-15 HISTORY — DX: Depression, unspecified: F32.A

## 2015-02-15 LAB — URINALYSIS COMPLETE WITH MICROSCOPIC (ARMC ONLY)
BILIRUBIN URINE: NEGATIVE
Glucose, UA: >500 mg/dL — AB
HGB URINE DIPSTICK: NEGATIVE
KETONES UR: NEGATIVE mg/dL
LEUKOCYTES UA: NEGATIVE
Nitrite: NEGATIVE
PH: 5 (ref 5.0–8.0)
PROTEIN: NEGATIVE mg/dL
SPECIFIC GRAVITY, URINE: 1.025 (ref 1.005–1.030)
Squamous Epithelial / LPF: NONE SEEN

## 2015-02-15 LAB — COMPREHENSIVE METABOLIC PANEL
ALK PHOS: 101 U/L (ref 38–126)
ALT: 34 U/L (ref 17–63)
ANION GAP: 6 (ref 5–15)
AST: 26 U/L (ref 15–41)
Albumin: 4.5 g/dL (ref 3.5–5.0)
BUN: 22 mg/dL — ABNORMAL HIGH (ref 6–20)
CALCIUM: 8.9 mg/dL (ref 8.9–10.3)
CHLORIDE: 104 mmol/L (ref 101–111)
CO2: 23 mmol/L (ref 22–32)
CREATININE: 1.25 mg/dL — AB (ref 0.61–1.24)
GFR calc Af Amer: >60 mL/min (ref >60)
GFR calc non Af Amer: >60 mL/min (ref >60)
Glucose, Bld: 225 mg/dL — ABNORMAL HIGH (ref 65–99)
Potassium: 4.9 mmol/L (ref 3.5–5.1)
SODIUM: 133 mmol/L — AB (ref 135–145)
Total Bilirubin: 0.5 mg/dL (ref 0.3–1.2)
Total Protein: 8.1 g/dL (ref 6.5–8.1)

## 2015-02-15 LAB — CBC
HCT: 49.6 % (ref 40.0–52.0)
HEMOGLOBIN: 16.8 g/dL (ref 13.0–18.0)
MCH: 30.6 pg (ref 26.0–34.0)
MCHC: 33.8 g/dL (ref 32.0–36.0)
MCV: 90.3 fL (ref 80.0–100.0)
PLATELETS: 195 10*3/uL (ref 150–440)
RBC: 5.5 MIL/uL (ref 4.40–5.90)
RDW: 12.8 % (ref 11.5–14.5)
WBC: 9.7 10*3/uL (ref 3.8–10.6)

## 2015-02-15 LAB — LIPASE, BLOOD: Lipase: 29 U/L (ref 22–51)

## 2015-02-15 LAB — GLUCOSE, CAPILLARY: Glucose-Capillary: 213 mg/dL — ABNORMAL HIGH (ref 65–99)

## 2015-02-15 MED ORDER — METFORMIN HCL 500 MG PO TABS: 500.0000 mg | ORAL_TABLET | Freq: Every day | ORAL | Status: DC

## 2015-02-15 MED ORDER — DICYCLOMINE HCL 20 MG PO TABS: 20.0000 mg | ORAL_TABLET | Freq: Three times a day (TID) | ORAL | Status: DC | PRN

## 2015-02-15 NOTE — ED Notes (Signed)
Patient started feeling like he had the flu on Friday. Pt reported blood sugar being in 300s on Friday. Patient has not taken his metformin in a year

## 2015-02-15 NOTE — ED Notes (Signed)

## 2015-02-15 NOTE — ED Notes (Signed)
Pt presents with abd pain. States his blood sugar was very high on Friday.

## 2015-02-15 NOTE — ED Provider Notes (Signed)
[facility] Emergency Department Provider Note    ____________________________________________  Time seen: 1825  I have reviewed the triage vital signs and the nursing notes.   HISTORY  Chief Complaint Abdominal Pain   History limited by: Not Limited   HPI Dustin Williamson is a 44 y.o. male who presents to the emergency department tonight because of concerns for abdominal cramping, diarrhea and hyperglycemia. He states that he first developed these symptoms 3 days ago. He describes the abdominal cramping as being generalized. This has been accompanied by diarrhea. He states he has had multiple episodes of watery diarrhea. During this time he did check his blood glucose which was elevated persistently. He states he has been off of his metformin for at least one year. He has not noticed any fevers. Denies any vomiting.   Past Medical History  Diagnosis Date  . Diabetes mellitus without complication   . Hypertension   . Depressed     Patient Active Problem List   Diagnosis Date Noted  . CLOSED FRACTURE OF BASE OF OTHER METACARPAL BONE 03/09/2009    History reviewed. No pertinent past surgical history.  No current outpatient prescriptions on file.  Allergies Review of patient's allergies indicates no known allergies.  No family history on file.  Social History Social History  Substance Use Topics  . Smoking status: Never Smoker   . Smokeless tobacco: None  . Alcohol Use: No    Review of Systems  Constitutional: Negative for fever. Cardiovascular: Negative for chest pain. Respiratory: Negative for shortness of breath. Gastrointestinal: Positive for abdominal cramping. Genitourinary: Negative for dysuria. Musculoskeletal: Negative for back pain. Skin: Negative for rash. Neurological: Negative for headaches, focal weakness or numbness.   10-point ROS otherwise negative.  ____________________________________________   PHYSICAL  EXAM:  VITAL SIGNS: ED Triage Vitals  Enc Vitals Group     BP 02/15/15 1633 131/94 mmHg     Pulse Rate 02/15/15 1633 93     Resp 02/15/15 1633 20     Temp 02/15/15 1633 97.4 F (36.3 C)     Temp Source 02/15/15 1633 Oral     SpO2 02/15/15 1633 99 %     Weight 02/15/15 1633 235 lb (106.595 kg)     Height 02/15/15 1633 5\' 7"  (1.702 m)     Head Cir --      Peak Flow --      Pain Score 02/15/15 1634 8   Constitutional: Alert and oriented. Well appearing and in no distress. Eyes: Conjunctivae are normal. PERRL. Normal extraocular movements. ENT   Head: Normocephalic and atraumatic.   Nose: No congestion/rhinnorhea.   Mouth/Throat: Mucous membranes are moist.   Neck: No stridor. Hematological/Lymphatic/Immunilogical: No cervical lymphadenopathy. Cardiovascular: Normal rate, regular rhythm.  No murmurs, rubs, or gallops. Respiratory: Normal respiratory effort without tachypnea nor retractions. Breath sounds are clear and equal bilaterally. No wheezes/rales/rhonchi. Gastrointestinal: Soft and nontender. No distention.  Genitourinary: Deferred Musculoskeletal: Normal range of motion in all extremities. No joint effusions.  No lower extremity tenderness nor edema. Neurologic:  Normal speech and language. No gross focal neurologic deficits are appreciated. Speech is normal.  Skin:  Skin is warm, dry and intact. No rash noted. Psychiatric: Mood and affect are normal. Speech and behavior are normal. Patient exhibits appropriate insight and judgment.  ____________________________________________    LABS (pertinent positives/negatives)  Labs Reviewed  COMPREHENSIVE METABOLIC PANEL - Abnormal; Notable for the following:    Sodium 133 (*)    Glucose, Bld 225 (*)  BUN 22 (*)    Creatinine, Ser 1.25 (*)    All other components within normal limits  URINALYSIS COMPLETEWITH MICROSCOPIC ([facility] ONLY) - Abnormal; Notable for the following:    Color, Urine YELLOW (*)     APPearance CLEAR (*)    Glucose, UA >500 (*)    Bacteria, UA RARE (*)    All other components within normal limits  GLUCOSE, CAPILLARY - Abnormal; Notable for the following:    Glucose-Capillary 213 (*)    All other components within normal limits  LIPASE, BLOOD  CBC  CBG MONITORING, ED     ____________________________________________   EKG  None  ____________________________________________    RADIOLOGY  None  ____________________________________________   PROCEDURES  Procedure(s) performed: None  Critical Care performed: No  ____________________________________________   INITIAL IMPRESSION / ASSESSMENT AND PLAN / ED COURSE  Pertinent labs & imaging results that were available during my care of the patient were reviewed by me and considered in my medical decision making (see chart for details).  Patient presented to the emergency department today because of concerns for hyperglycemia, abdominal pain and diarrhea. On exam patient's abdomen is benign. Do wonder if patient developed a gastrointestinal bug which is caused the diarrhea, abdominal cramping and subsequently elevated glucose. I will restart patient on his metoprolol. In addition I will give patient Bentyl for abdominal cramping. Encourage primary care follow-up.  ____________________________________________   FINAL CLINICAL IMPRESSION(S) / ED DIAGNOSES  Final diagnoses:  Abdominal cramping  Diarrhea  Hyperglycemia     Phineas Semen, MD 02/15/15 [phone]

## 2015-02-15 NOTE — Discharge Instructions (Signed)
Please seek medical attention for any high fevers, chest pain, shortness of breath, change in behavior, persistent vomiting, bloody stool or any other new or concerning symptoms. ° °Hyperglycemia °Hyperglycemia occurs when the glucose (sugar) in your blood is too high. Hyperglycemia can happen for many reasons, but it most often happens to people who do not know they have diabetes or are not managing their diabetes properly.  °CAUSES  °Whether you have diabetes or not, there are other causes of hyperglycemia. Hyperglycemia can occur when you have diabetes, but it can also occur in other situations that you might not be as aware of, such as: °Diabetes °· If you have diabetes and are having problems controlling your blood glucose, hyperglycemia could occur because of some of the following reasons: °¨ Not following your meal plan. °¨ Not taking your diabetes medications or not taking it properly. °¨ Exercising less or doing less activity than you normally do. °¨ Being sick. °Pre-diabetes °· This cannot be ignored. Before people develop Type 2 diabetes, they almost always have "pre-diabetes." This is when your blood glucose levels are higher than normal, but not yet high enough to be diagnosed as diabetes. Research has shown that some long-term damage to the body, especially the heart and circulatory system, may already be occurring during pre-diabetes. If you take action to manage your blood glucose when you have pre-diabetes, you may delay or prevent Type 2 diabetes from developing. °Stress °· If you have diabetes, you may be "diet" controlled or on oral medications or insulin to control your diabetes. However, you may find that your blood glucose is higher than usual in the hospital whether you have diabetes or not. This is often referred to as "stress hyperglycemia." Stress can elevate your blood glucose. This happens because of hormones put out by the body during times of stress. If stress has been the cause of  your high blood glucose, it can be followed regularly by your caregiver. That way he/she can make sure your hyperglycemia does not continue to get worse or progress to diabetes. °Steroids °· Steroids are medications that act on the infection fighting system (immune system) to block inflammation or infection. One side effect can be a rise in blood glucose. Most people can produce enough extra insulin to allow for this rise, but for those who cannot, steroids make blood glucose levels go even higher. It is not unusual for steroid treatments to "uncover" diabetes that is developing. It is not always possible to determine if the hyperglycemia will go away after the steroids are stopped. A special blood test called an A1c is sometimes done to determine if your blood glucose was elevated before the steroids were started. °SYMPTOMS °· Thirsty. °· Frequent urination. °· Dry mouth. °· Blurred vision. °· Tired or fatigue. °· Weakness. °· Sleepy. °· Tingling in feet or leg. °DIAGNOSIS  °Diagnosis is made by monitoring blood glucose in one or all of the following ways: °· A1c test. This is a chemical found in your blood. °· Fingerstick blood glucose monitoring. °· Laboratory results. °TREATMENT  °First, knowing the cause of the hyperglycemia is important before the hyperglycemia can be treated. Treatment may include, but is not be limited to: °· Education. °· Change or adjustment in medications. °· Change or adjustment in meal plan. °· Treatment for an illness, infection, etc. °· More frequent blood glucose monitoring. °· Change in exercise plan. °· Decreasing or stopping steroids. °· Lifestyle changes. °HOME CARE INSTRUCTIONS  °· Test your blood glucose   as directed. °· Exercise regularly. Your caregiver will give you instructions about exercise. Pre-diabetes or diabetes which comes on with stress is helped by exercising. °· Eat wholesome, balanced meals. Eat often and at regular, fixed times. Your caregiver or nutritionist  will give you a meal plan to guide your sugar intake. °· Being at an ideal weight is important. If needed, losing as little as 10 to 15 pounds may help improve blood glucose levels. °SEEK MEDICAL CARE IF:  °· You have questions about medicine, activity, or diet. °· You continue to have symptoms (problems such as increased thirst, urination, or weight gain). °SEEK IMMEDIATE MEDICAL CARE IF:  °· You are vomiting or have diarrhea. °· Your breath smells fruity. °· You are breathing faster or slower. °· You are very sleepy or incoherent. °· You have numbness, tingling, or pain in your feet or hands. °· You have chest pain. °· Your symptoms get worse even though you have been following your caregiver's orders. °· If you have any other questions or concerns. °Document Released: 11/29/2000 Document Revised: 08/28/2011 Document Reviewed: 10/02/2011 °[employer]® Patient Information ©2015 [employer]. This information is not intended to replace advice given to you by your health care provider. Make sure you discuss any questions you have with your health care provider. ° °

## 2015-03-27 ENCOUNTER — Emergency Department
Admission: EM | Admit: 2015-03-27 | Discharge: 2015-03-27 | Disposition: A | Discharge status: Home / Self Care | Payer: Self-pay | Attending: Emergency Medicine | Admitting: Emergency Medicine

## 2015-03-27 ENCOUNTER — Encounter: Payer: Self-pay | Admitting: *Deleted

## 2015-03-27 ENCOUNTER — Emergency Department: Payer: Self-pay

## 2015-03-27 DIAGNOSIS — F1210 Cannabis abuse, uncomplicated: Secondary | ICD-10-CM | POA: Insufficient documentation

## 2015-03-27 DIAGNOSIS — F1410 Cocaine abuse, uncomplicated: Secondary | ICD-10-CM | POA: Insufficient documentation

## 2015-03-27 DIAGNOSIS — F1990 Other psychoactive substance use, unspecified, uncomplicated: Secondary | ICD-10-CM

## 2015-03-27 DIAGNOSIS — E119 Type 2 diabetes mellitus without complications: Secondary | ICD-10-CM | POA: Insufficient documentation

## 2015-03-27 DIAGNOSIS — Z79899 Other long term (current) drug therapy: Secondary | ICD-10-CM | POA: Insufficient documentation

## 2015-03-27 DIAGNOSIS — I10 Essential (primary) hypertension: Secondary | ICD-10-CM | POA: Insufficient documentation

## 2015-03-27 LAB — COMPREHENSIVE METABOLIC PANEL
ALT: 27 U/L (ref 17–63)
AST: 23 U/L (ref 15–41)
Albumin: 4 g/dL (ref 3.5–5.0)
Alkaline Phosphatase: 85 U/L (ref 38–126)
Anion gap: 5 (ref 5–15)
BILIRUBIN TOTAL: 0.6 mg/dL (ref 0.3–1.2)
BUN: 18 mg/dL (ref 6–20)
CALCIUM: 9 mg/dL (ref 8.9–10.3)
CO2: 26 mmol/L (ref 22–32)
CREATININE: 1.12 mg/dL (ref 0.61–1.24)
Chloride: 106 mmol/L (ref 101–111)
GFR calc Af Amer: >60 mL/min (ref >60)
Glucose, Bld: 230 mg/dL — ABNORMAL HIGH (ref 65–99)
Potassium: 3.9 mmol/L (ref 3.5–5.1)
Sodium: 137 mmol/L (ref 135–145)
TOTAL PROTEIN: 6.8 g/dL (ref 6.5–8.1)

## 2015-03-27 LAB — CBC
HCT: 45.5 % (ref 40.0–52.0)
Hemoglobin: 15.7 g/dL (ref 13.0–18.0)
MCH: 30.9 pg (ref 26.0–34.0)
MCHC: 34.5 g/dL (ref 32.0–36.0)
MCV: 89.6 fL (ref 80.0–100.0)
PLATELETS: 233 10*3/uL (ref 150–440)
RBC: 5.07 MIL/uL (ref 4.40–5.90)
RDW: 12.5 % (ref 11.5–14.5)
WBC: 9.2 10*3/uL (ref 3.8–10.6)

## 2015-03-27 LAB — URINE DRUG SCREEN, QUALITATIVE (ARMC ONLY)
AMPHETAMINES, UR SCREEN: NOT DETECTED
BARBITURATES, UR SCREEN: NOT DETECTED
BENZODIAZEPINE, UR SCRN: NOT DETECTED
Cannabinoid 50 Ng, Ur ~~LOC~~: POSITIVE — AB
Cocaine Metabolite,Ur ~~LOC~~: POSITIVE — AB
MDMA (Ecstasy)Ur Screen: NOT DETECTED
METHADONE SCREEN, URINE: NOT DETECTED
Opiate, Ur Screen: NOT DETECTED
Phencyclidine (PCP) Ur S: NOT DETECTED
TRICYCLIC, UR SCREEN: NOT DETECTED

## 2015-03-27 LAB — ETHANOL

## 2015-03-27 MED ORDER — ACETAMINOPHEN 500 MG PO TABS
1000.0000 mg | ORAL_TABLET | Freq: Once | ORAL | Status: AC
  Administered 2015-03-27: 1000 mg via ORAL

## 2015-03-27 MED ORDER — IOHEXOL 300 MG/ML  SOLN
75.0000 mL | Freq: Once | INTRAMUSCULAR | Status: AC | PRN
  Administered 2015-03-27: 75 mL via INTRAVENOUS

## 2015-03-27 MED ORDER — ACETAMINOPHEN 500 MG PO TABS
ORAL_TABLET | ORAL | Status: AC
  Administered 2015-03-27: 1000 mg via ORAL
  Filled 2015-03-27: qty 2

## 2015-03-27 NOTE — ED Notes (Signed)
Pt will go for CT once labs result per MD Malinda. Pt made aware and verbalized understanding at this time.

## 2015-03-27 NOTE — ED Notes (Signed)
BEHAVIORAL HEALTH ROUNDING Patient sleeping: No. Patient alert and oriented: yes Behavior appropriate: Yes.  ;  Nutrition and fluids offered: Yes  Toileting and hygiene offered: Yes  Sitter present: yes Law enforcement present: Yes  

## 2015-03-27 NOTE — ED Notes (Signed)
t states he wants detox from cocaine, states he uses about $400 since yesterday, last used was this AM

## 2015-03-27 NOTE — ED Notes (Signed)

## 2015-03-27 NOTE — ED Notes (Signed)
Dustin Williamson is Dustin Williamson is pending D/C to [facility]

## 2015-03-27 NOTE — BH Assessment (Signed)
Assessment Note  Dustin Williamson is an 44 y.o. male. who presents voluntarily to [facility] ED for detox .Pt reports smoking approximately "$300-$400 of crack weekly" and states that his habit has increased over the past two days he has used as much as he typically uses in a weeks time. Pt admits that he stole his mother's credit card yesterday to aid his drug habit. Pt denies any other substance abuse.   Pt reports he has a history of depression. He reports recent symptoms including social withdrawal, isolation, and loneliness. Pt denies SI and has not had any previous attempts of SI.  Pt denies any self injurious behaviors. Pt denies homicidal ideation or history of violence. Pt denies any history of auditory or visual hallucinations. Pt denies access to firearms or other weapons.  Pt states his adult children want him to get help.  He identifies his children as being family or friends that are supportive.  He states he is currently employed.  Pt lives with mother and is unsure if he is able to return to his current living situation since he stole her credit card. Pt denies any physical, verbal, sexual abuse currently or as a child. Pt denies legal problems.. Pt's strengths and assets are his willpower to change for his children.  Pt is dressed in hospital scrubs, alert, oriented x4 with normal speech and normal motor behavior. Eye contact is good. Pt's mood is ashamed due to his drug probably and affect is congruent with mood. Thought process is coherent and relevant. Cognitive functioning and fund of knowledge is intact and age appropriate. There are no signs of hallucinations, delusions, bizarre behaviors, or other indicators of psychotic process. Pt was pleasant and cooperative throughout assessment. Pt has no previous history of substance abuse detox nor counseling . Pt does not have a history of inpatient hospitalization.  Pt wants to detox and feels that treatment will help him to regain his  life.  Diagnosis: Substance Abuse- Crack Cocaine  Past Medical History:  Past Medical History  Diagnosis Date  . Diabetes mellitus without complication (HCC)   . Hypertension   . Depressed     History reviewed. No pertinent past surgical history.  Family History: History reviewed. No pertinent family history.  Social History:  reports that he has never smoked. He does not have any smokeless tobacco history on file. He reports that he does not drink alcohol. His drug history is not on file.  Additional Social History:  Alcohol / Drug Use Pain Medications: None reported Prescriptions: None Reported Over the Counter: None Reported History of alcohol / drug use?: Yes Longest period of sobriety (when/how long): none Negative Consequences of Use: Financial, Personal relationships, Work / Mining engineer #1 Name of Substance 1: Crack 1 - Age of First Use: 32 1 - Amount (size/oz): "$300- $400 weekly"  1 - Frequency: daily 1 - Duration: past 6 years 1 - Last Use / Amount: 03/27/15 AM  CIWA: CIWA-Ar BP: (!) 165/122 mmHg Pulse Rate: (!) 109 COWS: Clinical Opiate Withdrawal Scale (COWS) Resting Pulse Rate: Pulse Rate 101-120 Sweating: No report of chills or flushing Restlessness: Able to sit still Pupil Size: Pupils pinned or normal size for room light Bone or Joint Aches: Not present Runny Nose or Tearing: Not present GI Upset: No GI symptoms Tremor: No tremor Yawning: No yawning Anxiety or Irritability: None Gooseflesh Skin: Skin is smooth COWS Total Score: 2  Allergies: No Known Allergies  Home Medications:  (Not in a  hospital admission)  OB/GYN Status:  No LMP for male patient.  General Assessment Data Location of Assessment: [facility] ED TTS Assessment: In system Is this a Tele or Face-to-Face Assessment?: Face-to-Face Is this an Initial Assessment or a Re-assessment for this encounter?: Initial Assessment Marital status: Divorced [place] name: N/a Is patient  pregnant?: No Pregnancy Status: No Living Arrangements: Parent Can pt return to current living arrangement?: Yes (Pt is unsure if his mother will let him return) Admission Status: Voluntary Is patient capable of signing voluntary admission?: Yes Referral Source: Self/Family/Friend Insurance type: None  Medical Screening Exam [facility] Walk-in ONLY) Medical Exam completed: Yes  Crisis Care Plan Living Arrangements: Parent Name of Psychiatrist: None Name of Therapist: None  Education Status Is patient currently in school?: No Current Grade: 0 Highest grade of school patient has completed: [employer] Name of school: N/a Contact person: None  Risk to self with the past 6 months Suicidal Ideation: No Has patient been a risk to self within the past 6 months prior to admission? : No Suicidal Intent: No Has patient had any suicidal intent within the past 6 months prior to admission? : No Is patient at risk for suicide?: No Suicidal Plan?: No Has patient had any suicidal plan within the past 6 months prior to admission? : No Access to Means: No What has been your use of drugs/alcohol within the last 12 months?: Crack, "$300-400 weekly" for the past 6 years Previous Attempts/Gestures: No How many times?: 0 Other Self Harm Risks: None reported Triggers for Past Attempts: None known Intentional Self Injurious Behavior: None Family Suicide History: No Persecutory voices/beliefs?: No Depression: No Depression Symptoms: Isolating (Lonely) Substance abuse history and/or treatment for substance abuse?: No Suicide prevention information given to non-admitted patients: Not applicable  Risk to Others within the past 6 months Homicidal Ideation: No Does patient have any lifetime risk of violence toward others beyond the six months prior to admission? : No Thoughts of Harm to Others: No Current Homicidal Intent: No Current Homicidal Plan: No Access to Homicidal Means: No Identified  Victim: None reported History of harm to others?: No Assessment of Violence: None Noted Violent Behavior Description: None noted Does patient have access to weapons?: No Criminal Charges Pending?: No Does patient have a court date: No Is patient on probation?: No  Psychosis Hallucinations: None noted Delusions: None noted  Mental Status Report Appearance/Hygiene: Unremarkable Eye Contact: Good Motor Activity: Unremarkable Speech: Unremarkable Level of Consciousness: Alert Mood: Ashamed/humiliated Affect: Appropriate to circumstance Anxiety Level: Moderate Thought Processes: Coherent, Relevant Judgement: Partial Orientation: Person, Place, Time, Situation, Appropriate for developmental age Obsessive Compulsive Thoughts/Behaviors: None  Cognitive Functioning Concentration: Normal Memory: Recent Intact, Remote Intact IQ: Average Insight: Poor Impulse Control: Poor Appetite: Fair Weight Loss: 0 Weight Gain: 0 Sleep: Decreased Total Hours of Sleep: 1 Vegetative Symptoms: None  ADLScreening [facility] Assessment Services) Patient's cognitive ability adequate to safely complete daily activities?: Yes Patient able to express need for assistance with ADLs?: Yes Independently performs ADLs?: Yes (appropriate for developmental age)  Prior Inpatient Therapy Prior Inpatient Therapy: No  Prior Outpatient Therapy Prior Outpatient Therapy: No Does patient have an ACCT team?: No Does patient have Intensive In-House Services?  : No Does patient have [facility] services? : No Does patient have P4CC services?: No  ADL Screening (condition at time of admission) Patient's cognitive ability adequate to safely complete daily activities?: Yes Patient able to express need for assistance with ADLs?: Yes Independently performs ADLs?: Yes (appropriate for developmental  age)       Abuse/Neglect Assessment (Assessment to be complete while patient is alone) Physical Abuse: Denies Verbal  Abuse: Denies Sexual Abuse: Denies Exploitation of patient/patient's resources: Denies Self-Neglect: Denies   Consults Spiritual Care Consult Needed: No Social Work Consult Needed: No      Additional Information 1:1 In Past 12 Months?: No CIRT Risk: No Elopement Risk: No     Disposition:  Disposition Initial Assessment Completed for this Encounter: Yes Disposition of Patient: Referred to Patient referred to: [facility]  On Site Evaluation by:   Reviewed with Physician:    Ramon Dredge Ashima Shrake 03/27/2015 1:47 PM

## 2015-03-27 NOTE — BHH Counselor (Signed)
Writer faxed referral to [facility]. Robert at [facility] is reviewing the referral and will call back soon with a decision.   Anne Shutter, LPCA Therapeutic Triage Specialist

## 2015-03-27 NOTE — ED Notes (Signed)
Patient transported to CT 

## 2015-03-27 NOTE — ED Notes (Signed)
Pt given back belongings at this time, changing, waiting in hallway for [facility] to come and pick up. Pt calm and cooperative, no acute distress noted.

## 2015-03-27 NOTE — ED Provider Notes (Signed)
[facility] Emergency Department Provider Note  ____________________________________________  Time seen: Approximately 1:09 PM  I have reviewed the triage vital signs and the nursing notes.   HISTORY  Chief Complaint Drug Problem    HPI Dustin Williamson is a 44 y.o. male who reports he has a problem with cocaine. He is $400 in the last night. He is using it for 6 years. His children brought her in today to get help. Patient reports he has a runny nose and some cough as well. Cough is nonproductive he does not have a fever also complains of frequent migraines and says that we start at the base of the left neck where the muscles get very tense. He is having a headache there now on the base of the left neck. Examination there is an ill-defined mass in the soft tissue there. Patient's past medical history also includes what's noted below.  Past Medical History  Diagnosis Date  . Diabetes mellitus without complication (HCC)   . Hypertension   . Depressed     Patient Active Problem List   Diagnosis Date Noted  . CLOSED FRACTURE OF BASE OF OTHER METACARPAL BONE 03/09/2009    History reviewed. No pertinent past surgical history.  Current Outpatient Rx  Name  Route  Sig  Dispense  Refill  . LORazepam (ATIVAN) 0.5 MG tablet   Oral   Take 0.5 mg by mouth at bedtime.         . metFORMIN (GLUCOPHAGE) 500 MG tablet   Oral   Take 1 tablet (500 mg total) by mouth daily with breakfast.   30 tablet   1   . propranolol (INDERAL) 80 MG tablet   Oral   Take 80 mg by mouth 2 (two) times daily.         Marland Kitchen dicyclomine (BENTYL) 20 MG tablet   Oral   Take 1 tablet (20 mg total) by mouth 3 (three) times daily as needed for spasms.   20 tablet   0     Allergies Review of patient's allergies indicates no known allergies.  History reviewed. No pertinent family history.  Social History Social History  Substance Use Topics  . Smoking status: Never Smoker   .  Smokeless tobacco: None  . Alcohol Use: No    Review of Systems Constitutional: No fever/chills Eyes: No visual changes. ENT: No sore throat. Cardiovascular: Denies chest pain. Respiratory: Denies shortness of breath. Gastrointestinal: No abdominal pain.  No nausea, no vomiting.  No diarrhea.  No constipation. Genitourinary: Negative for dysuria. Musculoskeletal: Negative for back pain. Skin: Negative for rash. Neurological: Negative for headaches, focal weakness or numbness.  10-point ROS otherwise negative.  ____________________________________________   PHYSICAL EXAM:  VITAL SIGNS: ED Triage Vitals  Enc Vitals Group     BP 03/27/15 1150 165/122 mmHg     Pulse Rate 03/27/15 1150 109     Resp 03/27/15 1150 20     Temp 03/27/15 1150 98.2 F (36.8 C)     Temp Source 03/27/15 1150 Oral     SpO2 03/27/15 1150 97 %     Weight 03/27/15 1150 235 lb (106.595 kg)     Height 03/27/15 1150  (1.676 m)     Head Cir --      Peak Flow --      Pain Score 03/27/15 1151 7     Pain Loc --      Pain Edu? --      Excl.  in GC? --    Constitutional: Alert and oriented. Well appearing and in no acute distress. Eyes: Conjunctivae are normal. PERRL. EOMI. Head: Atraumatic. Nose: No congestion/rhinnorhea. Mouth/Throat: Mucous membranes are moist.  Oropharynx non-erythematous. Neck: No stridor. 3-4 cm ill-defined soft density soft tissues of the left neck posteriorly. There is no adenopathy Cardiovascular: Normal rate, regular rhythm. Grossly normal heart sounds.  Good peripheral circulation. Respiratory: Normal respiratory effort.  No retractions. Lungs CTAB. Gastrointestinal: Soft and nontender. No distention. No abdominal bruits. No CVA tenderness. Musculoskeletal: No lower extremity tenderness nor edema.  No joint effusions. Neurologic:  Normal speech and language. No gross focal neurologic deficits are appreciated. No gait instability. Skin:  Skin is warm, dry and intact. No rash  noted. Psychiatric: Mood and affect are normal. Speech and behavior are normal.  ____________________________________________   LABS (all labs ordered are listed, but only abnormal results are displayed)  Labs Reviewed  COMPREHENSIVE METABOLIC PANEL  ETHANOL  CBC  URINE DRUG SCREEN, QUALITATIVE ([facility] ONLY)   ____________________________________________  EKG   ____________________________________________  RADIOLOGY   ____________________________________________   PROCEDURES    ____________________________________________   INITIAL IMPRESSION / ASSESSMENT AND PLAN / ED COURSE  Pertinent labs & imaging results that were available during my care of the patient were reviewed by me and considered in my medical decision making (see chart for details).   ____________________________________________   FINAL CLINICAL IMPRESSION(S) / ED DIAGNOSES  Final diagnoses:  Drug use      Arnaldo Natal, MD 03/28/15 [phone]

## 2015-03-27 NOTE — Discharge Instructions (Signed)
You will go to [facility] now.  Return to the emergency department if you develop thoughts of hurting you're self or anyone else, hallucinations, or any other symptoms concerning to you.

## 2015-03-27 NOTE — ED Notes (Addendum)
Per TTS, pt has been accepted by [facility] at this time, MD Sharma Covert made aware, will arrange d/c papers momentarily. [facility] states to TTS, on their way to pick pt up at this time. Pt made aware and verbalized understanding at this time

## 2015-06-13 ENCOUNTER — Encounter: Payer: Self-pay | Admitting: *Deleted

## 2015-06-13 ENCOUNTER — Emergency Department
Admission: EM | Admit: 2015-06-13 | Discharge: 2015-06-13 | Disposition: A | Discharge status: Home / Self Care | Payer: Self-pay | Attending: Emergency Medicine | Admitting: Emergency Medicine

## 2015-06-13 DIAGNOSIS — E1165 Type 2 diabetes mellitus with hyperglycemia: Secondary | ICD-10-CM | POA: Insufficient documentation

## 2015-06-13 DIAGNOSIS — R739 Hyperglycemia, unspecified: Secondary | ICD-10-CM

## 2015-06-13 DIAGNOSIS — Z7984 Long term (current) use of oral hypoglycemic drugs: Secondary | ICD-10-CM | POA: Insufficient documentation

## 2015-06-13 DIAGNOSIS — Z79899 Other long term (current) drug therapy: Secondary | ICD-10-CM | POA: Insufficient documentation

## 2015-06-13 DIAGNOSIS — I10 Essential (primary) hypertension: Secondary | ICD-10-CM | POA: Insufficient documentation

## 2015-06-13 LAB — URINALYSIS COMPLETE WITH MICROSCOPIC (ARMC ONLY)
BACTERIA UA: NONE SEEN
Bilirubin Urine: NEGATIVE
HGB URINE DIPSTICK: NEGATIVE
Ketones, ur: NEGATIVE mg/dL
LEUKOCYTES UA: NEGATIVE
Nitrite: NEGATIVE
PROTEIN: NEGATIVE mg/dL
SQUAMOUS EPITHELIAL / LPF: NONE SEEN
Specific Gravity, Urine: 1.029 (ref 1.005–1.030)
pH: 5 (ref 5.0–8.0)

## 2015-06-13 LAB — BASIC METABOLIC PANEL
Anion gap: 8 (ref 5–15)
BUN: 14 mg/dL (ref 6–20)
CHLORIDE: 101 mmol/L (ref 101–111)
CO2: 23 mmol/L (ref 22–32)
Calcium: 9.2 mg/dL (ref 8.9–10.3)
Creatinine, Ser: 1.18 mg/dL (ref 0.61–1.24)
GFR calc Af Amer: >60 mL/min (ref >60)
GFR calc non Af Amer: >60 mL/min (ref >60)
GLUCOSE: 393 mg/dL — AB (ref 65–99)
POTASSIUM: 4.4 mmol/L (ref 3.5–5.1)
Sodium: 132 mmol/L — ABNORMAL LOW (ref 135–145)

## 2015-06-13 LAB — CBC
HEMATOCRIT: 47.4 % (ref 40.0–52.0)
Hemoglobin: 16.5 g/dL (ref 13.0–18.0)
MCH: 30.6 pg (ref 26.0–34.0)
MCHC: 34.8 g/dL (ref 32.0–36.0)
MCV: 87.9 fL (ref 80.0–100.0)
Platelets: 210 10*3/uL (ref 150–440)
RBC: 5.39 MIL/uL (ref 4.40–5.90)
RDW: 12.3 % (ref 11.5–14.5)
WBC: 8.1 10*3/uL (ref 3.8–10.6)

## 2015-06-13 LAB — GLUCOSE, CAPILLARY
GLUCOSE-CAPILLARY: 388 mg/dL — AB (ref 65–99)
GLUCOSE-CAPILLARY: 214 mg/dL — AB (ref 65–99)

## 2015-06-13 MED ORDER — SODIUM CHLORIDE 0.9 % IV BOLUS (SEPSIS)
1000.0000 mL | Freq: Once | INTRAVENOUS | Status: AC
  Administered 2015-06-13: 1000 mL via INTRAVENOUS

## 2015-06-13 NOTE — ED Provider Notes (Signed)
[facility] Emergency Department Provider Note    ____________________________________________  Time seen: 2140  I have reviewed the triage vital signs and the nursing notes.   HISTORY  Chief Complaint Hyperglycemia   History limited by: Not Limited   HPI Dustin Williamson is a 44 y.o. male with history of diabetes who presents to the emergency department today because of concerns for hyperglycemia. He states that his blood sugars have been elevated for the past 4-5 days. He states he thinks it started after he ate some Dustin Williamson at work. He is on metformin for his diabetes. He states he tried doubling his dose of metformin did not have any significant decrease in his sugars. He states that other than his sugars being high is also felt he has had some blurry vision. He denies any nausea vomiting or diarrhea. He denies any fevers.   Past Medical History  Diagnosis Date  . Diabetes mellitus without complication (HCC)   . Hypertension   . Depressed     Patient Active Problem List   Diagnosis Date Noted  . CLOSED FRACTURE OF BASE OF OTHER METACARPAL BONE 03/09/2009    History reviewed. No pertinent past surgical history.  Current Outpatient Rx  Name  Route  Sig  Dispense  Refill  . dicyclomine (BENTYL) 20 MG tablet   Oral   Take 1 tablet (20 mg total) by mouth 3 (three) times daily as needed for spasms.   20 tablet   0   . LORazepam (ATIVAN) 0.5 MG tablet   Oral   Take 0.5 mg by mouth at bedtime.         . metFORMIN (GLUCOPHAGE) 500 MG tablet   Oral   Take 1 tablet (500 mg total) by mouth daily with breakfast.   30 tablet   1   . propranolol (INDERAL) 80 MG tablet   Oral   Take 80 mg by mouth 2 (two) times daily.           Allergies Review of patient's allergies indicates no known allergies.  History reviewed. No pertinent family history.  Social History Social History  Substance Use Topics  . Smoking status: Never Smoker    . Smokeless tobacco: None  . Alcohol Use: No    Review of Systems  Constitutional: Negative for fever. Cardiovascular: Negative for chest pain. Respiratory: Negative for shortness of breath. Gastrointestinal: Negative for abdominal pain, vomiting and diarrhea. Neurological: Negative for headaches, focal weakness or numbness. Positive for blurry vision   10-point ROS otherwise negative.  ____________________________________________   PHYSICAL EXAM:  VITAL SIGNS: ED Triage Vitals  Enc Vitals Group     BP 06/13/15 1752 130/95 mmHg     Pulse Rate 06/13/15 1752 99     Resp 06/13/15 1752 16     Temp --      Temp src --      SpO2 06/13/15 1752 96 %     Weight 06/13/15 1752 228 lb (103.42 kg)     Height 06/13/15 1752  (1.676 m)   Constitutional: Alert and oriented. Well appearing and in no distress. Eyes: Conjunctivae are normal. PERRL. Normal extraocular movements. ENT   Head: Normocephalic and atraumatic.   Nose: No congestion/rhinnorhea.   Mouth/Throat: Mucous membranes are moist.   Neck: No stridor. Hematological/Lymphatic/Immunilogical: No cervical lymphadenopathy. Cardiovascular: Normal rate, regular rhythm.  No murmurs, rubs, or gallops. Respiratory: Normal respiratory effort without tachypnea nor retractions. Breath sounds are clear and equal bilaterally. No  wheezes/rales/rhonchi. Gastrointestinal: Soft and nontender. No distention. Genitourinary: Deferred Musculoskeletal: Normal range of motion in all extremities. No joint effusions.  No lower extremity tenderness nor edema. Neurologic:  Normal speech and language. No gross focal neurologic deficits are appreciated.  Skin:  Skin is warm, dry and intact. No rash noted. Psychiatric: Mood and affect are normal. Speech and behavior are normal. Patient exhibits appropriate insight and judgment.  ____________________________________________    LABS (pertinent positives/negatives)  Labs Reviewed   BASIC METABOLIC PANEL - Abnormal; Notable for the following:    Sodium 132 (*)    Glucose, Bld 393 (*)    All other components within normal limits  URINALYSIS COMPLETEWITH MICROSCOPIC ([facility] ONLY) - Abnormal; Notable for the following:    Color, Urine YELLOW (*)    APPearance CLEAR (*)    Glucose, UA >500 (*)    All other components within normal limits  GLUCOSE, CAPILLARY - Abnormal; Notable for the following:    Glucose-Capillary 388 (*)    All other components within normal limits  GLUCOSE, CAPILLARY - Abnormal; Notable for the following:    Glucose-Capillary 214 (*)    All other components within normal limits  CBC  CBG MONITORING, ED     ____________________________________________   EKG  None  ____________________________________________    RADIOLOGY  None   ____________________________________________   PROCEDURES  Procedure(s) performed: None  Critical Care performed: No  ____________________________________________   INITIAL IMPRESSION / ASSESSMENT AND PLAN / ED COURSE  Pertinent labs & imaging results that were available during my care of the patient were reviewed by me and considered in my medical decision making (see chart for details).  Patient presented to the emergency department today because of concerns for hyperglycemia. The patient was given a liter of fluids which did help decrease his sugars. Patient stated he did feel better after the fluids. Discussed with patient importance of following up with primary care doctor. At this point no signs of DKA. No signs of any infection. Will discharge home.  ____________________________________________   FINAL CLINICAL IMPRESSION(S) / ED DIAGNOSES  Final diagnoses:  Hyperglycemia     Dustin SemenGraydon Glee Lashomb, MD 06/13/15 2229

## 2015-06-13 NOTE — Discharge Instructions (Signed)
Please seek medical attention for any high fevers, chest pain, shortness of breath, change in behavior, persistent vomiting, bloody stool or any other new or concerning symptoms. ° ° °Hyperglycemia °Hyperglycemia occurs when the glucose (sugar) in your blood is too high. Hyperglycemia can happen for many reasons, but it most often happens to people who do not know they have diabetes or are not managing their diabetes properly.  °CAUSES  °Whether you have diabetes or not, there are other causes of hyperglycemia. Hyperglycemia can occur when you have diabetes, but it can also occur in other situations that you might not be as aware of, such as: °Diabetes °· If you have diabetes and are having problems controlling your blood glucose, hyperglycemia could occur because of some of the following reasons: °¨ Not following your meal plan. °¨ Not taking your diabetes medications or not taking it properly. °¨ Exercising less or doing less activity than you normally do. °¨ Being sick. °Pre-diabetes °· This cannot be ignored. Before people develop Type 2 diabetes, they almost always have "pre-diabetes." This is when your blood glucose levels are higher than normal, but not yet high enough to be diagnosed as diabetes. Research has shown that some long-term damage to the body, especially the heart and circulatory system, may already be occurring during pre-diabetes. If you take action to manage your blood glucose when you have pre-diabetes, you may delay or prevent Type 2 diabetes from developing. °Stress °· If you have diabetes, you may be "diet" controlled or on oral medications or insulin to control your diabetes. However, you may find that your blood glucose is higher than usual in the hospital whether you have diabetes or not. This is often referred to as "stress hyperglycemia." Stress can elevate your blood glucose. This happens because of hormones put out by the body during times of stress. If stress has been the cause of  your high blood glucose, it can be followed regularly by your caregiver. That way he/she can make sure your hyperglycemia does not continue to get worse or progress to diabetes. °Steroids °· Steroids are medications that act on the infection fighting system (immune system) to block inflammation or infection. One side effect can be a rise in blood glucose. Most people can produce enough extra insulin to allow for this rise, but for those who cannot, steroids make blood glucose levels go even higher. It is not unusual for steroid treatments to "uncover" diabetes that is developing. It is not always possible to determine if the hyperglycemia will go away after the steroids are stopped. A special blood test called an A1c is sometimes done to determine if your blood glucose was elevated before the steroids were started. °SYMPTOMS °· Thirsty. °· Frequent urination. °· Dry mouth. °· Blurred vision. °· Tired or fatigue. °· Weakness. °· Sleepy. °· Tingling in feet or leg. °DIAGNOSIS  °Diagnosis is made by monitoring blood glucose in one or all of the following ways: °· A1c test. This is a chemical found in your blood. °· Fingerstick blood glucose monitoring. °· Laboratory results. °TREATMENT  °First, knowing the cause of the hyperglycemia is important before the hyperglycemia can be treated. Treatment may include, but is not be limited to: °· Education. °· Change or adjustment in medications. °· Change or adjustment in meal plan. °· Treatment for an illness, infection, etc. °· More frequent blood glucose monitoring. °· Change in exercise plan. °· Decreasing or stopping steroids. °· Lifestyle changes. °HOME CARE INSTRUCTIONS  °· Test your blood   glucose as directed. °· Exercise regularly. Your caregiver will give you instructions about exercise. Pre-diabetes or diabetes which comes on with stress is helped by exercising. °· Eat wholesome, balanced meals. Eat often and at regular, fixed times. Your caregiver or nutritionist  will give you a meal plan to guide your sugar intake. °· Being at an ideal weight is important. If needed, losing as little as 10 to 15 pounds may help improve blood glucose levels. °SEEK MEDICAL CARE IF:  °· You have questions about medicine, activity, or diet. °· You continue to have symptoms (problems such as increased thirst, urination, or weight gain). °SEEK IMMEDIATE MEDICAL CARE IF:  °· You are vomiting or have diarrhea. °· Your breath smells fruity. °· You are breathing faster or slower. °· You are very sleepy or incoherent. °· You have numbness, tingling, or pain in your feet or hands. °· You have chest pain. °· Your symptoms get worse even though you have been following your caregiver's orders. °· If you have any other questions or concerns. °  °This information is not intended to replace advice given to you by your health care provider. Make sure you discuss any questions you have with your health care provider. °  °Document Released: 11/29/2000 Document Revised: 08/28/2011 Document Reviewed: 02/09/2015 °Elsevier Interactive Patient Education ©2016 [employer] ° °

## 2015-06-13 NOTE — ED Notes (Signed)
Pt reports having elevated blood glucose since last Thursday. Pt reports metformin 500 mg was no longer helping to control BG so pt took 1000mg  yesterday in attempt decrease levels but did not have success. Pt has incvreased fluid intake without success either. Pt denies increased urination or hunger. Pt reports being tired this past week, loss of weight, and blurry vision lately.

## 2015-09-26 ENCOUNTER — Inpatient Hospital Stay
Admission: EM | Admit: 2015-09-26 | Discharge: 2015-09-28 | DRG: 638 | Disposition: A | Discharge status: Home / Self Care | Payer: Self-pay | Attending: Internal Medicine | Admitting: Internal Medicine

## 2015-09-26 ENCOUNTER — Emergency Department: Payer: Self-pay

## 2015-09-26 ENCOUNTER — Encounter: Payer: Self-pay | Admitting: *Deleted

## 2015-09-26 DIAGNOSIS — R739 Hyperglycemia, unspecified: Secondary | ICD-10-CM | POA: Diagnosis present

## 2015-09-26 DIAGNOSIS — F329 Major depressive disorder, single episode, unspecified: Secondary | ICD-10-CM | POA: Diagnosis present

## 2015-09-26 DIAGNOSIS — M62838 Other muscle spasm: Secondary | ICD-10-CM | POA: Diagnosis present

## 2015-09-26 DIAGNOSIS — E871 Hypo-osmolality and hyponatremia: Secondary | ICD-10-CM | POA: Diagnosis present

## 2015-09-26 DIAGNOSIS — I10 Essential (primary) hypertension: Secondary | ICD-10-CM | POA: Diagnosis present

## 2015-09-26 DIAGNOSIS — Z833 Family history of diabetes mellitus: Secondary | ICD-10-CM

## 2015-09-26 DIAGNOSIS — R1084 Generalized abdominal pain: Secondary | ICD-10-CM

## 2015-09-26 DIAGNOSIS — R221 Localized swelling, mass and lump, neck: Secondary | ICD-10-CM

## 2015-09-26 DIAGNOSIS — Z9114 Patient's other noncompliance with medication regimen: Secondary | ICD-10-CM

## 2015-09-26 DIAGNOSIS — Z9889 Other specified postprocedural states: Secondary | ICD-10-CM

## 2015-09-26 DIAGNOSIS — R112 Nausea with vomiting, unspecified: Secondary | ICD-10-CM

## 2015-09-26 DIAGNOSIS — E1165 Type 2 diabetes mellitus with hyperglycemia: Principal | ICD-10-CM | POA: Diagnosis present

## 2015-09-26 LAB — COMPREHENSIVE METABOLIC PANEL
ALBUMIN: 4.4 g/dL (ref 3.5–5.0)
ALT: 33 U/L (ref 17–63)
ANION GAP: 14 (ref 5–15)
AST: 29 U/L (ref 15–41)
Alkaline Phosphatase: 118 U/L (ref 38–126)
BUN: 15 mg/dL (ref 6–20)
CHLORIDE: 88 mmol/L — AB (ref 101–111)
CO2: 20 mmol/L — AB (ref 22–32)
Calcium: 9.3 mg/dL (ref 8.9–10.3)
Creatinine, Ser: 1.43 mg/dL — ABNORMAL HIGH (ref 0.61–1.24)
GFR calc non Af Amer: 58 mL/min — ABNORMAL LOW (ref >60)
Glucose, Bld: 773 mg/dL (ref 65–99)
Potassium: 3.8 mmol/L (ref 3.5–5.1)
SODIUM: 122 mmol/L — AB (ref 135–145)
Total Bilirubin: 0.8 mg/dL (ref 0.3–1.2)
Total Protein: 8 g/dL (ref 6.5–8.1)

## 2015-09-26 LAB — CBC
HCT: 51.4 % (ref 40.0–52.0)
HEMOGLOBIN: 17.7 g/dL (ref 13.0–18.0)
MCH: 29.8 pg (ref 26.0–34.0)
MCHC: 34.4 g/dL (ref 32.0–36.0)
MCV: 86.5 fL (ref 80.0–100.0)
Platelets: 212 10*3/uL (ref 150–440)
RBC: 5.95 MIL/uL — AB (ref 4.40–5.90)
RDW: 12.5 % (ref 11.5–14.5)
WBC: 10.2 10*3/uL (ref 3.8–10.6)

## 2015-09-26 LAB — URINALYSIS COMPLETE WITH MICROSCOPIC (ARMC ONLY)
Bacteria, UA: NONE SEEN
Bilirubin Urine: NEGATIVE
Glucose, UA: >500 mg/dL — AB
Hgb urine dipstick: NEGATIVE
Ketones, ur: NEGATIVE mg/dL
Leukocytes, UA: NEGATIVE
Nitrite: NEGATIVE
PH: 5 (ref 5.0–8.0)
PROTEIN: NEGATIVE mg/dL
RBC / HPF: NONE SEEN RBC/hpf (ref 0–5)
SPECIFIC GRAVITY, URINE: 1.031 — AB (ref 1.005–1.030)
SQUAMOUS EPITHELIAL / LPF: NONE SEEN
WBC UA: NONE SEEN WBC/hpf (ref 0–5)

## 2015-09-26 LAB — TSH: TSH: 1.669 u[IU]/mL (ref 0.350–4.500)

## 2015-09-26 LAB — GLUCOSE, CAPILLARY
Glucose-Capillary: 569 mg/dL (ref 65–99)
Glucose-Capillary: 493 mg/dL — ABNORMAL HIGH (ref 65–99)
Glucose-Capillary: 460 mg/dL — ABNORMAL HIGH (ref 65–99)
Glucose-Capillary: 345 mg/dL — ABNORMAL HIGH (ref 65–99)
Glucose-Capillary: 318 mg/dL — ABNORMAL HIGH (ref 65–99)
Glucose-Capillary: 309 mg/dL — ABNORMAL HIGH (ref 65–99)

## 2015-09-26 LAB — HEMOGLOBIN A1C: Hgb A1c MFr Bld: 16 % — ABNORMAL HIGH (ref 4.0–6.0)

## 2015-09-26 LAB — TROPONIN I: Troponin I: <0.03 ng/mL (ref <0.031)

## 2015-09-26 LAB — LIPASE, BLOOD: Lipase: 36 U/L (ref 11–51)

## 2015-09-26 MED ORDER — SODIUM CHLORIDE 0.9 % IV BOLUS (SEPSIS)
1000.0000 mL | Freq: Once | INTRAVENOUS | Status: AC
  Administered 2015-09-26: 1000 mL via INTRAVENOUS

## 2015-09-26 MED ORDER — IOPAMIDOL (ISOVUE-300) INJECTION 61%
100.0000 mL | Freq: Once | INTRAVENOUS | Status: AC | PRN
  Administered 2015-09-26: 100 mL via INTRAVENOUS

## 2015-09-26 MED ORDER — ACETAMINOPHEN 650 MG RE SUPP: 650.0000 mg | Freq: Four times a day (QID) | RECTAL | Status: DC | PRN

## 2015-09-26 MED ORDER — INSULIN GLARGINE 100 UNIT/ML ~~LOC~~ SOLN
15.0000 [IU] | Freq: Every day | SUBCUTANEOUS | Status: DC
Start: 2015-09-26 — End: 2015-09-27
  Administered 2015-09-26: 15 [IU] via SUBCUTANEOUS
  Filled 2015-09-26: qty 0.15

## 2015-09-26 MED ORDER — INSULIN ASPART 100 UNIT/ML ~~LOC~~ SOLN
0.0000 [IU] | Freq: Three times a day (TID) | SUBCUTANEOUS | Status: DC
  Administered 2015-09-26: 15 [IU] via SUBCUTANEOUS
  Administered 2015-09-26: 11 [IU] via SUBCUTANEOUS
  Administered 2015-09-27: 8 [IU] via SUBCUTANEOUS
  Administered 2015-09-27: 5 [IU] via SUBCUTANEOUS
  Administered 2015-09-28: 8 [IU] via SUBCUTANEOUS
  Administered 2015-09-28: 5 [IU] via SUBCUTANEOUS
  Filled 2015-09-26: qty 5
  Filled 2015-09-26: qty 8
  Filled 2015-09-26: qty 5
  Filled 2015-09-26: qty 11
  Filled 2015-09-26: qty 8

## 2015-09-26 MED ORDER — MORPHINE SULFATE (PF) 4 MG/ML IV SOLN
4.0000 mg | Freq: Once | INTRAVENOUS | Status: AC
  Administered 2015-09-26: 4 mg via INTRAVENOUS
  Filled 2015-09-26: qty 1

## 2015-09-26 MED ORDER — MORPHINE SULFATE (PF) 2 MG/ML IV SOLN
2.0000 mg | INTRAVENOUS | Status: DC | PRN
  Administered 2015-09-26 – 2015-09-27 (×3): 2 mg via INTRAVENOUS
  Filled 2015-09-26 (×3): qty 1

## 2015-09-26 MED ORDER — INSULIN ASPART 100 UNIT/ML ~~LOC~~ SOLN
10.0000 [IU] | Freq: Once | SUBCUTANEOUS | Status: AC
  Administered 2015-09-26: 10 [IU] via SUBCUTANEOUS
  Filled 2015-09-26: qty 10

## 2015-09-26 MED ORDER — ENOXAPARIN SODIUM 40 MG/0.4ML ~~LOC~~ SOLN
40.0000 mg | SUBCUTANEOUS | Status: DC
  Administered 2015-09-26 – 2015-09-28 (×3): 40 mg via SUBCUTANEOUS
  Filled 2015-09-26 (×3): qty 0.4

## 2015-09-26 MED ORDER — SODIUM CHLORIDE 0.9 % IV SOLN
INTRAVENOUS | Status: DC
  Administered 2015-09-26 – 2015-09-27 (×5): via INTRAVENOUS

## 2015-09-26 MED ORDER — ONDANSETRON HCL 4 MG/2ML IJ SOLN
4.0000 mg | Freq: Once | INTRAMUSCULAR | Status: AC | PRN
  Administered 2015-09-26: 4 mg via INTRAVENOUS
  Filled 2015-09-26: qty 2

## 2015-09-26 MED ORDER — DIATRIZOATE MEGLUMINE & SODIUM 66-10 % PO SOLN
15.0000 mL | Freq: Once | ORAL | Status: AC
  Administered 2015-09-26: 15 mL via ORAL

## 2015-09-26 MED ORDER — ACETAMINOPHEN 325 MG PO TABS
650.0000 mg | ORAL_TABLET | Freq: Four times a day (QID) | ORAL | Status: DC | PRN
  Administered 2015-09-26: 650 mg via ORAL
  Filled 2015-09-26: qty 2

## 2015-09-26 MED ORDER — SODIUM CHLORIDE 0.9% FLUSH
3.0000 mL | Freq: Two times a day (BID) | INTRAVENOUS | Status: DC
  Administered 2015-09-26: 3 mL via INTRAVENOUS

## 2015-09-26 MED ORDER — FAMOTIDINE IN NACL 20-0.9 MG/50ML-% IV SOLN
20.0000 mg | Freq: Once | INTRAVENOUS | Status: AC
  Administered 2015-09-26: 20 mg via INTRAVENOUS
  Filled 2015-09-26: qty 50

## 2015-09-26 MED ORDER — DOCUSATE SODIUM 100 MG PO CAPS
100.0000 mg | ORAL_CAPSULE | Freq: Two times a day (BID) | ORAL | Status: DC
  Administered 2015-09-26 – 2015-09-28 (×5): 100 mg via ORAL
  Filled 2015-09-26 (×5): qty 1

## 2015-09-26 NOTE — ED Provider Notes (Signed)
[facility] Emergency Department Provider Note  ____________________________________________  Time seen: Approximately 1:27 AM  I have reviewed the triage vital signs and the nursing notes.   HISTORY  Chief Complaint Abdominal Pain    HPI Dustin Williamson is a 45 y.o. male who presents to the ED from home with a chief complaint of abdominal pain. Patient has a history of hypertension and diabetes who has not been on his medicines for over 2 months.  Reports lower abdominal pain x 3 weeks, worsening over the past 3 days. Now reports pain is diffuse and nonradiating. Presents tonight with nausea and vomiting 1 associated with polydipsia. Denies associated fever, chills, chest pain, shortness of breath, dysuria, diarrhea. Last bowel movement several hours ago which was normal for patient. Denies testicular pain or swelling. Denies recent travel or trauma. Nothing makes his symptoms better or worse.   Past Medical History  Diagnosis Date  . Diabetes mellitus without complication (HCC)   . Hypertension   . Depressed     Patient Active Problem List   Diagnosis Date Noted  . CLOSED FRACTURE OF BASE OF OTHER METACARPAL BONE 03/09/2009    Past Surgical History  Procedure Laterality Date  . Hernia repair      Current Outpatient Rx  Name  Route  Sig  Dispense  Refill  . dicyclomine (BENTYL) 20 MG tablet   Oral   Take 1 tablet (20 mg total) by mouth 3 (three) times daily as needed for spasms.   20 tablet   0   . LORazepam (ATIVAN) 0.5 MG tablet   Oral   Take 0.5 mg by mouth at bedtime.         . metFORMIN (GLUCOPHAGE) 500 MG tablet   Oral   Take 1 tablet (500 mg total) by mouth daily with breakfast.   30 tablet   1   . propranolol (INDERAL) 80 MG tablet   Oral   Take 80 mg by mouth 2 (two) times daily.           Allergies Review of patient's allergies indicates no known allergies.  History reviewed. No pertinent family history.  Social  History Social History  Substance Use Topics  . Smoking status: Never Smoker   . Smokeless tobacco: Never Used  . Alcohol Use: No    Review of Systems  Constitutional: No fever/chills. Eyes: No visual changes. ENT: No sore throat. Cardiovascular: Denies chest pain. Respiratory: Denies shortness of breath. Gastrointestinal: Positive for abdominal pain.  Positive for nausea and vomiting.  No diarrhea.  No constipation. Genitourinary: Negative for dysuria. Musculoskeletal: Negative for back pain. Skin: Negative for rash. Neurological: Negative for headaches, focal weakness or numbness.  10-point ROS otherwise negative.  ____________________________________________   PHYSICAL EXAM:  VITAL SIGNS: ED Triage Vitals  Enc Vitals Group     BP 09/26/15 0101 155/118 mmHg     Pulse Rate 09/26/15 0101 131     Resp 09/26/15 0101 24     Temp 09/26/15 0101 98 F (36.7 C)     Temp Source 09/26/15 0101 Oral     SpO2 09/26/15 0101 97 %     Weight 09/26/15 0101 234 lb (106.142 kg)     Height 09/26/15 0101 5\' 7"  (1.702 m)     Head Cir --      Peak Flow --      Pain Score 09/26/15 0102 8     Pain Loc --      Pain Edu? --  Excl. in GC? --     Constitutional: Alert and oriented. Well appearing and in no acute distress. Eyes: Conjunctivae are normal. PERRL. EOMI. Head: Atraumatic. Nose: No congestion/rhinnorhea. Mouth/Throat: Mucous membranes are moist.  Oropharynx non-erythematous. Neck: No stridor.   Cardiovascular: Tachycardic rate, regular rhythm. Grossly normal heart sounds.  Good peripheral circulation. Respiratory: Normal respiratory effort.  No retractions. Lungs CTAB. Gastrointestinal: Mildly distended. Diffusely tender to palpation without rebound or guarding. No abdominal bruits. No CVA tenderness. Musculoskeletal: No lower extremity tenderness nor edema.  No joint effusions. Neurologic:  Normal speech and language. No gross focal neurologic deficits are appreciated. No  gait instability. Skin:  Skin is warm, dry and intact. No rash noted. Psychiatric: Mood and affect are normal. Speech and behavior are normal.  ____________________________________________   LABS (all labs ordered are listed, but only abnormal results are displayed)  Labs Reviewed  COMPREHENSIVE METABOLIC PANEL - Abnormal; Notable for the following:    Sodium 122 (*)    Chloride 88 (*)    CO2 20 (*)    Glucose, Bld 773 (*)    Creatinine, Ser 1.43 (*)    GFR calc non Af Amer 58 (*)    All other components within normal limits  CBC - Abnormal; Notable for the following:    RBC 5.95 (*)    All other components within normal limits  URINALYSIS COMPLETEWITH MICROSCOPIC ([facility] ONLY) - Abnormal; Notable for the following:    Color, Urine STRAW (*)    APPearance CLEAR (*)    Glucose, UA >500 (*)    Specific Gravity, Urine 1.031 (*)    All other components within normal limits  GLUCOSE, CAPILLARY - Abnormal; Notable for the following:    Glucose-Capillary >600 (*)    All other components within normal limits  GLUCOSE, CAPILLARY - Abnormal; Notable for the following:    Glucose-Capillary >600 (*)    All other components within normal limits  LIPASE, BLOOD  TROPONIN I  CBG MONITORING, ED   ____________________________________________  EKG  ED ECG REPORT I, Mylynn Dinh J, the attending physician, personally viewed and interpreted this ECG.   Date: 09/26/2015  EKG Time: 0104  Rate: 126  Rhythm: sinus tachycardia  Axis: Normal  Intervals:none  ST&T Change: Nonspecific  ____________________________________________  RADIOLOGY  CT abdomen and pelvis with contrast interpreted per Dr. Clovis Riley: 1. No acute findings are evident in the abdomen or pelvis. 2. Left nephrolithiasis. 3. Fat containing umbilical hernia. ____________________________________________   PROCEDURES  Procedure(s) performed: None  Critical Care performed:  No  ____________________________________________   INITIAL IMPRESSION / ASSESSMENT AND PLAN / ED COURSE  Pertinent labs & imaging results that were available during my care of the patient were reviewed by me and considered in my medical decision making (see chart for details).  45 year old male with a history of hypertension and diabetes who said just been out of his medications for over 2 months who presents with abdominal pain for 3 weeks which has worsened over the past 3 days. Patient is tachycardic and hypertensive; FSBS is greater than 600. He is diffusely tender on my abdominal exam. Will initiate IV fluid resuscitation, analgesia, antiemetic and obtain CT abdomen/pelvis to evaluate etiology of patient's pain.  ----------------------------------------- 4:02 AM on 09/26/2015 -----------------------------------------  Updated patient of laboratory and imaging results. Discussed case with hospitalist to evaluate patient in the emergency department for admission. ____________________________________________   FINAL CLINICAL IMPRESSION(S) / ED DIAGNOSES  Final diagnoses:  Hyperglycemia  Hyponatremia  Generalized abdominal pain  Non-intractable vomiting with nausea, vomiting of unspecified type      Irean Hong, MD 09/26/15 [phone]

## 2015-09-26 NOTE — ED Notes (Signed)
Pt c/o abdominal pain x 3 weeks, worsening 3 days ago. Pt presents w/ c/o n/v tonight, polydipsia. Pt is tachycardic and hypertensive in triage. Pt states he has hx of HTN, DMII, and hypercholesterolemia and has been out of his meds for at least 2 months. Pt vomited x 1 prior to arrival.

## 2015-09-26 NOTE — Care Management Note (Addendum)
Case Management Note  Patient Details  Name: Dustin Williamson MRN: [id number] Date of Birth: 15-Jan-1971  Subjective/Objective:   Uninsured 45yo Dustin Williamson was admitted 09/26/15 with abdominal pain and polydipsia. Serum glucose was 750. Hx; HTN, DM II.  Dustin Williamson lost his job 2 months ago and his insurance, and has been off all his medications. [facility] (has a 1[facility] appointment in May). Pharmacy=[facility] on [address]. Resides with his Mother. Has own car for transportation. No home oxygen or home health services. Has a Glucometer and a blood pressure machine at home. Was provided with an [facility] today [facility]. Case management will follow for discharge planning but do not anticipate any home health services needs.                 Action/Plan:   Expected Discharge Date:                  Expected Discharge Plan:     In-House Referral:     Discharge planning Services     Post Acute Care Choice:    Choice offered to:     DME Arranged:    DME Agency:     HH Arranged:    HH Agency:     Status of Service:     Medicare Important Message Given:    Date Medicare IM Given:    Medicare IM give by:    Date Additional Medicare IM Given:    Additional Medicare Important Message give by:     If discussed at Long Length of Stay Meetings, dates discussed:    Additional Comments:  Rockett,Marilyn A, RN 09/26/2015, 2:59 PM

## 2015-09-26 NOTE — ED Notes (Signed)
Pt given meal tray.

## 2015-09-26 NOTE — ED Notes (Signed)
CBG at 229 am >600 mg/dL. Dolores FrameSung, MD made aware.

## 2015-09-26 NOTE — ED Notes (Signed)
CBG at 557am=   493 mg/dL

## 2015-09-26 NOTE — Progress Notes (Signed)
LCSW provided patient with [employer] booklet. Reviewed medication management clinic and encouraged patient to complete it and visit this clinic.  Severo Beber lcsw [phone]

## 2015-09-26 NOTE — H&P (Signed)
Dustin Williamson is an 45 y.o. male.   Chief Complaint: Abdominal pain HPI: The patient with past medical history significant for diabetes mellitus type 2 and essential hypertension presents emergency department complaining of abdominal pain and heartburn. The patient states that the latter occurs with any type of food or drink that he consumes. He is felt the symptoms for at least a month now. Notably the patient has not been on his diabetic medication for at least 2 months due to financial hardship. He admits to occasional nausea but denies vomiting. Bowel movements have been normal but he admits that his abdomen has been distended and bloated. In the emergency department the patient was found to have a serum glucose greater than 750. Anion gap was normal. He was given fluid and IV insulin in the emergency department before emergency department staff called for admission.  Past Medical History  Diagnosis Date  . Diabetes mellitus without complication ([place])   . Hypertension   . Depressed     Past Surgical History  Procedure Laterality Date  . Hernia repair      Family History  Problem Relation Age of Onset  . Diabetes Mellitus II Father    Social History:  reports that he has never smoked. He has never used smokeless tobacco. He reports that he does not drink alcohol or use illicit drugs.  Allergies: No Known Allergies  Prior to Admission medications   Medication Sig Start Date End Date Taking? Authorizing Provider  dicyclomine (BENTYL) 20 MG tablet Take 1 tablet (20 mg total) by mouth 3 (three) times daily as needed for spasms. Patient not taking: Reported on 09/26/2015 02/15/15 02/15/16  Nance Pear, MD  metFORMIN (GLUCOPHAGE) 500 MG tablet Take 1 tablet (500 mg total) by mouth daily with breakfast. Patient not taking: Reported on 09/26/2015 02/15/15 02/15/16  Nance Pear, MD     Results for orders placed or performed during the hospital encounter of 09/26/15 (from the past 48  hour(s))  Glucose, capillary     Status: Abnormal   Collection Time: 09/26/15  1:09 AM  Result Value Ref Range   Glucose-Capillary >600 (HH) 65 - 99 mg/dL  Lipase, blood     Status: None   Collection Time: 09/26/15  1:23 AM  Result Value Ref Range   Lipase 36 11 - 51 U/L  Comprehensive metabolic panel     Status: Abnormal   Collection Time: 09/26/15  1:23 AM  Result Value Ref Range   Sodium 122 (L) 135 - 145 mmol/L   Potassium 3.8 3.5 - 5.1 mmol/L    Comment: HEMOLYSIS AT THIS LEVEL MAY AFFECT RESULT   Chloride 88 (L) 101 - 111 mmol/L   CO2 20 (L) 22 - 32 mmol/L   Glucose, Bld 773 (HH) 65 - 99 mg/dL    Comment: CRITICAL RESULT CALLED TO, READ BACK BY AND VERIFIED WITH BUTCH WOODS AT 0236 09/26/15.PMH   BUN 15 6 - 20 mg/dL   Creatinine, Ser 1.43 (H) 0.61 - 1.24 mg/dL   Calcium 9.3 8.9 - 10.3 mg/dL   Total Protein 8.0 6.5 - 8.1 g/dL   Albumin 4.4 3.5 - 5.0 g/dL   AST 29 15 - 41 U/L   ALT 33 17 - 63 U/L   Alkaline Phosphatase 118 38 - 126 U/L   Total Bilirubin 0.8 0.3 - 1.2 mg/dL   GFR calc non Af Amer 58 (L) >60 mL/min   GFR calc Af Amer >60 >60 mL/min    Comment: (  NOTE) The eGFR has been calculated using the CKD EPI equation. This calculation has not been validated in all clinical situations. eGFR's persistently <60 mL/min signify possible Chronic Kidney Disease.    Anion gap 14 5 - 15  CBC     Status: Abnormal   Collection Time: 09/26/15  1:23 AM  Result Value Ref Range   WBC 10.2 3.8 - 10.6 K/uL   RBC 5.95 (H) 4.40 - 5.90 MIL/uL   Hemoglobin 17.7 13.0 - 18.0 g/dL   HCT 51.4 40.0 - 52.0 %   MCV 86.5 80.0 - 100.0 fL   MCH 29.8 26.0 - 34.0 pg   MCHC 34.4 32.0 - 36.0 g/dL   RDW 12.5 11.5 - 14.5 %   Platelets 212 150 - 440 K/uL  Troponin I     Status: None   Collection Time: 09/26/15  1:23 AM  Result Value Ref Range   Troponin I <0.03 <0.031 ng/mL    Comment:        NO INDICATION OF MYOCARDIAL INJURY.   Glucose, capillary     Status: Abnormal   Collection Time:  09/26/15  2:28 AM  Result Value Ref Range   Glucose-Capillary >600 (HH) 65 - 99 mg/dL  Urinalysis complete, with microscopic ([facility] only)     Status: Abnormal   Collection Time: 09/26/15  3:16 AM  Result Value Ref Range   Color, Urine STRAW (A) YELLOW   APPearance CLEAR (A) CLEAR   Glucose, UA >500 (A) NEGATIVE mg/dL   Bilirubin Urine NEGATIVE NEGATIVE   Ketones, ur NEGATIVE NEGATIVE mg/dL   Specific Gravity, Urine 1.031 (H) 1.005 - 1.030   Hgb urine dipstick NEGATIVE NEGATIVE   pH 5.0 5.0 - 8.0   Protein, ur NEGATIVE NEGATIVE mg/dL   Nitrite NEGATIVE NEGATIVE   Leukocytes, UA NEGATIVE NEGATIVE   RBC / HPF NONE SEEN 0 - 5 RBC/hpf   WBC, UA NONE SEEN 0 - 5 WBC/hpf   Bacteria, UA NONE SEEN NONE SEEN   Squamous Epithelial / LPF NONE SEEN NONE SEEN  Glucose, capillary     Status: Abnormal   Collection Time: 09/26/15  4:33 AM  Result Value Ref Range   Glucose-Capillary 569 (HH) 65 - 99 mg/dL  Glucose, capillary     Status: Abnormal   Collection Time: 09/26/15  5:57 AM  Result Value Ref Range   Glucose-Capillary 493 (H) 65 - 99 mg/dL   Ct Abdomen Pelvis W Contrast  09/26/2015  CLINICAL DATA:  Left lower quadrant pain for 3 weeks, worsening in the past 3 days. Nausea and vomiting. EXAM: CT ABDOMEN AND PELVIS WITH CONTRAST TECHNIQUE: Multidetector CT imaging of the abdomen and pelvis was performed using the standard protocol following bolus administration of intravenous contrast. CONTRAST:  111m ISOVUE-300 IOPAMIDOL (ISOVUE-300) INJECTION 61% COMPARISON:  10/16/2014 FINDINGS: Lower chest:  No significant abnormality Hepatobiliary: There are normal appearances of the liver, gallbladder and bile ducts. Pancreas: Normal Spleen: Norm Adrenals/Urinary Tract: Both adrenals are normal. The right kidney is normal. The left kidney contains 2 calculi, measuring 3 x 5 mm and 7 x 8 mm. There are no ureteral calculi. There is no ureteral dilatation or hydronephrosis. The urinary bladder is unremarkable.  Stomach/Bowel: There are normal appearances of the stomach, small bowel and colon. The appendix is normal. Vascular/Lymphatic: The abdominal aorta is normal in caliber. There is no atherosclerotic calcification. There is no adenopathy in the abdomen or pelvis. Reproductive: Unremarkable Other: No acute inflammatory changes are evident in the abdomen  or pelvis. There is no ascites. Musculoskeletal: No significant abnormality incidentally noted L5 vertebral hemangioma. Small fat containing umbilical hernia. IMPRESSION: 1. No acute findings are evident in the abdomen or pelvis. 2. Left nephrolithiasis. 3. Fat containing umbilical hernia. Electronically Signed   By: Andreas Newport M.D.   On: 09/26/2015 03:26    Review of Systems  Constitutional: Negative for fever and chills.  HENT: Negative for sore throat and tinnitus.   Eyes: Negative for blurred vision and redness.  Respiratory: Negative for cough and shortness of breath.   Cardiovascular: Negative for chest pain, palpitations, orthopnea and PND.  Gastrointestinal: Positive for heartburn and abdominal pain. Negative for nausea, vomiting and diarrhea.  Genitourinary: Negative for dysuria, urgency and frequency.  Musculoskeletal: Negative for myalgias and joint pain.  Skin: Negative for rash.       No lesions  Neurological: Negative for speech change, focal weakness and weakness.  Endo/Heme/Allergies: Does not bruise/bleed easily.       No temperature intolerance  Psychiatric/Behavioral: Negative for depression and suicidal ideas.    Blood pressure 132/97, pulse 94, temperature 98 F (36.7 C), temperature source Oral, resp. rate 16, height 5' 7"  (1.702 m), weight 106.142 kg (234 lb), SpO2 97 %. Physical Exam  Nursing note and vitals reviewed. Constitutional: He is oriented to person, place, and time. He appears well-developed and well-nourished. No distress.  HENT:  Head: Normocephalic and atraumatic.  Mouth/Throat: Oropharynx is clear  and moist.  Eyes: Conjunctivae and EOM are normal. Pupils are equal, round, and reactive to light. No scleral icterus.  Neck: Normal range of motion. Neck supple. No JVD present. No tracheal deviation present. No thyromegaly present.  Cardiovascular: Normal rate, regular rhythm and normal heart sounds.  Exam reveals no gallop and no friction rub.   No murmur heard. Respiratory: Effort normal and breath sounds normal. No respiratory distress.  GI: Soft. Bowel sounds are normal. He exhibits distension. There is no tenderness.  Genitourinary:  Deferred  Musculoskeletal: Normal range of motion. He exhibits no edema.  Lymphadenopathy:    He has no cervical adenopathy.  Neurological: He is alert and oriented to person, place, and time. No cranial nerve deficit.  Skin: Skin is warm and dry. No rash noted. No erythema.  Psychiatric: He has a normal mood and affect. His behavior is normal. Judgment and thought content normal.     Assessment/Plan This is a 45 year old male admitted for hyperglycemia. 1. Hyperglycemia: Improving after insulin and intravenous fluid. Continue to hydrate with normal saline and start basal insulin as well as sliding scale insulin. 2. Pseudohyponatremia: Secondary to hyperglycemia. 3. Essential hypertension: Controlled for now; monitor blood pressure 4. Diabetes mellitus type 2: Uncontrolled. Plan as above 5. DVT prophylaxis: Lovenox 6. GI prophylaxis: H2 blocker when necessary The patient is a full code. Time spent on admission orders and patient care approximately 45 minutes  Harrie Foreman, MD 09/26/2015, 7:05 AM

## 2015-09-26 NOTE — ED Notes (Signed)
CBG at 0434am= 569 mg/dL

## 2015-09-27 ENCOUNTER — Encounter: Payer: Self-pay | Admitting: Radiology

## 2015-09-27 ENCOUNTER — Inpatient Hospital Stay: Payer: MEDICAID

## 2015-09-27 LAB — GLUCOSE, CAPILLARY
Glucose-Capillary: 300 mg/dL — ABNORMAL HIGH (ref 65–99)
Glucose-Capillary: 401 mg/dL — ABNORMAL HIGH (ref 65–99)
Glucose-Capillary: 226 mg/dL — ABNORMAL HIGH (ref 65–99)
Glucose-Capillary: 285 mg/dL — ABNORMAL HIGH (ref 65–99)

## 2015-09-27 LAB — BASIC METABOLIC PANEL
Anion gap: 5 (ref 5–15)
BUN: 13 mg/dL (ref 6–20)
CO2: 23 mmol/L (ref 22–32)
Calcium: 8 mg/dL — ABNORMAL LOW (ref 8.9–10.3)
Chloride: 105 mmol/L (ref 101–111)
Creatinine, Ser: 1.14 mg/dL (ref 0.61–1.24)
GFR calc Af Amer: >60 mL/min (ref >60)
GFR calc non Af Amer: >60 mL/min (ref >60)
Glucose, Bld: 304 mg/dL — ABNORMAL HIGH (ref 65–99)
Potassium: 3.7 mmol/L (ref 3.5–5.1)
Sodium: 133 mmol/L — ABNORMAL LOW (ref 135–145)

## 2015-09-27 LAB — URINE DRUG SCREEN, QUALITATIVE (ARMC ONLY)
AMPHETAMINES, UR SCREEN: NOT DETECTED
Barbiturates, Ur Screen: NOT DETECTED
Benzodiazepine, Ur Scrn: NOT DETECTED
CANNABINOID 50 NG, UR ~~LOC~~: NOT DETECTED
COCAINE METABOLITE, UR ~~LOC~~: NOT DETECTED
MDMA (ECSTASY) UR SCREEN: NOT DETECTED
Methadone Scn, Ur: NOT DETECTED
Opiate, Ur Screen: POSITIVE — AB
PHENCYCLIDINE (PCP) UR S: NOT DETECTED
Tricyclic, Ur Screen: NOT DETECTED

## 2015-09-27 MED ORDER — OXYCODONE-ACETAMINOPHEN 5-325 MG PO TABS
1.0000 | ORAL_TABLET | ORAL | Status: DC | PRN
Start: 2015-09-27 — End: 2015-09-28
  Administered 2015-09-27 – 2015-09-28 (×2): 1 via ORAL
  Filled 2015-09-27 (×2): qty 1

## 2015-09-27 MED ORDER — IOPAMIDOL (ISOVUE-300) INJECTION 61%
75.0000 mL | Freq: Once | INTRAVENOUS | Status: AC | PRN
  Administered 2015-09-27: 75 mL via INTRAVENOUS

## 2015-09-27 MED ORDER — CLONIDINE HCL 0.1 MG PO TABS
0.2000 mg | ORAL_TABLET | ORAL | Status: AC
  Administered 2015-09-27: 0.2 mg via ORAL
  Filled 2015-09-27: qty 2

## 2015-09-27 MED ORDER — PROPRANOLOL HCL ER 80 MG PO CP24
80.0000 mg | ORAL_CAPSULE | Freq: Every day | ORAL | Status: DC
  Administered 2015-09-27 – 2015-09-28 (×2): 80 mg via ORAL
  Filled 2015-09-27 (×2): qty 1

## 2015-09-27 MED ORDER — INSULIN ASPART 100 UNIT/ML ~~LOC~~ SOLN
4.0000 [IU] | Freq: Three times a day (TID) | SUBCUTANEOUS | Status: DC
  Administered 2015-09-27 – 2015-09-28 (×4): 4 [IU] via SUBCUTANEOUS
  Filled 2015-09-27 (×3): qty 4

## 2015-09-27 MED ORDER — LIVING WELL WITH DIABETES BOOK
Freq: Once | Status: DC
  Filled 2015-09-27: qty 1

## 2015-09-27 MED ORDER — METFORMIN HCL 500 MG PO TABS
1000.0000 mg | ORAL_TABLET | Freq: Two times a day (BID) | ORAL | Status: DC
  Administered 2015-09-27 – 2015-09-28 (×3): 1000 mg via ORAL
  Filled 2015-09-27 (×3): qty 2

## 2015-09-27 MED ORDER — HYDRALAZINE HCL 20 MG/ML IJ SOLN: 10.0000 mg | Freq: Four times a day (QID) | INTRAMUSCULAR | Status: DC | PRN

## 2015-09-27 MED ORDER — CLONIDINE HCL 0.1 MG PO TABS
0.2000 mg | ORAL_TABLET | Freq: Three times a day (TID) | ORAL | Status: DC
  Administered 2015-09-27 – 2015-09-28 (×4): 0.2 mg via ORAL
  Filled 2015-09-27 (×4): qty 2

## 2015-09-27 MED ORDER — INSULIN GLARGINE 100 UNIT/ML ~~LOC~~ SOLN
20.0000 [IU] | Freq: Every day | SUBCUTANEOUS | Status: DC
  Administered 2015-09-27: 20 [IU] via SUBCUTANEOUS
  Filled 2015-09-27 (×2): qty 0.2

## 2015-09-27 MED ORDER — ONDANSETRON HCL 4 MG PO TABS
4.0000 mg | ORAL_TABLET | Freq: Four times a day (QID) | ORAL | Status: DC | PRN
  Administered 2015-09-27: 4 mg via ORAL
  Filled 2015-09-27: qty 1

## 2015-09-27 MED ORDER — INSULIN ASPART 100 UNIT/ML ~~LOC~~ SOLN
20.0000 [IU] | Freq: Once | SUBCUTANEOUS | Status: AC
  Administered 2015-09-27: 20 [IU] via SUBCUTANEOUS
  Filled 2015-09-27: qty 20

## 2015-09-27 MED ORDER — INSULIN GLARGINE 100 UNIT/ML ~~LOC~~ SOLN
15.0000 [IU] | Freq: Every day | SUBCUTANEOUS | Status: DC
  Administered 2015-09-27: 15 [IU] via SUBCUTANEOUS
  Filled 2015-09-27 (×2): qty 0.15

## 2015-09-27 MED ORDER — PROMETHAZINE HCL 25 MG PO TABS
12.5000 mg | ORAL_TABLET | Freq: Four times a day (QID) | ORAL | Status: DC | PRN
  Administered 2015-09-27: 12.5 mg via ORAL
  Filled 2015-09-27: qty 1

## 2015-09-27 MED ORDER — HYDROCHLOROTHIAZIDE 25 MG PO TABS
25.0000 mg | ORAL_TABLET | Freq: Every day | ORAL | Status: DC
  Administered 2015-09-27 – 2015-09-28 (×2): 25 mg via ORAL
  Filled 2015-09-27 (×2): qty 1

## 2015-09-27 MED ORDER — INSULIN STARTER KIT- SYRINGES (ENGLISH)
1.0000 | Freq: Once | Status: DC
  Filled 2015-09-27: qty 1

## 2015-09-27 NOTE — Progress Notes (Signed)
Pt BP 180/140 c/o HA left side of his face is swollen mostly notable under eye and check bone. Called Dr Allena KatzPatel orders received. Meds given recheck BP at 0902 down slightly (see flowsheet) will continue to monitor as it has not been a full 60 min since med admin.

## 2015-09-27 NOTE — Progress Notes (Signed)
Spoke with Dr. Allena KatzPatel because patient is on telemetry and wanted to take a shower. MD to place order to discontinue telemetry

## 2015-09-27 NOTE — Progress Notes (Addendum)
Spoke with patient about diabetes and home regimen for diabetes control. Patient reports that he lost his job about 2 months ago and he has not taken his oral DM medication for 2 months. Patient currently does not have the financial ability to see his doctor. However, he states that he went to the [facility] and was told that he could not be seen there and was referred to the [facility] and has an upcoming appointment on May 16th at the [facility]. Patient was taking Metformin 500 mg QAM prior to running out of medication. Patient states that he has a glucometer and testing supplies at home. Inquired about prior A1C and patient reports that he does not recall his last A1C value and he does not know what an A1C is. Discussed A1C results (16.0% on 09/26/15) and explained that his current A1C indicates an average glucose of 413 mg/dl over the past 2-3 months.  Discussed glucose and A1C goals.  Discussed importance of checking CBGs and maintaining good CBG control to prevent long-term and short-term complications. Explained how hyperglycemia leads to damage within blood vessels which lead to the common complications seen with uncontrolled diabetes. Stressed to the patient the importance of improving glycemic control to prevent further complications from uncontrolled diabetes. Discussed impact of nutrition, exercise, stress, sickness, and medications on diabetes control. Discussed carbohydrates, carbohydrate goals per day and meal, along with portion sizes. Encouraged patient to check his glucose 4 times per day (before meals and at bedtime) and to keep a log book of glucose readings and insulin taken which he will need to take to doctor appointments. Explained how the doctor he follows up with can use the log book to continue to make insulin adjustments if needed. Explained that his glucose will not be perfect at time of discharge and that he will need to check glucose and follow up with a doctor  so insulin adjustments can continue to be made. Discussed Lantus, Novolog, NPH, Regular, and 70/30 insulin. Since patient does not have insurance would recommend patient be started on and discharged on NOVOLIN 70/30 insulin. NOVOLIN 70/30 can be purchased from [facility] for $25 per vial which is much more affordable than Lantus and Novolog. Explained and demonstrated proper technique on how to draw up insulin with vial and syringe.Demonstrated to patient how to draw up air, inject air into vial, draw up insulin, and inject into practice dome. Patient demonstrated competency with insulin administration by vial and syringe. RN to continue working with patient on insulin injections before discharge and allow patient to administer own insulin injections while inpatient.  Encouraged patient to read over Living Well with Diabetes booklet and insulin starter kit.  Patient verbalized understanding of information discussed and he states that he has no further questions at this time related to diabetes.  Patient has an initial appointment at the [facility] on May 16th. Since patient will be discharged new to insulin, recommend having case management see if patient could be seen before then (within the next 7 days). At time of discharge, patient will need an affordable insulin. Therefore, recommend using 70/30 insulin. Please consider ordering 70/30 as an inpatient to determine effect prior to discharge. Recommend ordering 70/30 25 units BID (which will provide 35 units of basal insulin and 15 units for meal coverage per day).  If 70/30 is ordered as recommended, please discontinue Lantus and meal coverage.  Thanks, Barnie Alderman, RN, MSN, CDE Diabetes Coordinator Inpatient Diabetes Program [phone]  Team Pager) [phone] ([place] office) [phone] [facility] office) [phone] [facility] office)

## 2015-09-27 NOTE — Progress Notes (Signed)
Inpatient Diabetes Program Recommendations  AACE/ADA: New Consensus Statement on Inpatient Glycemic Control (2015)  Target Ranges:  Prepandial:   less than 140 mg/dL      Peak postprandial:   less than 180 mg/dL (1-2 hours)      Critically ill patients:  140 - 180 mg/dL  Results for Charletta CousinJONES, Emmitte D (MRN [id number]) as of 09/27/2015 08:21  Ref. Range 09/26/2015 07:31 09/26/2015 11:45 09/26/2015 15:46 09/26/2015 20:56 09/27/2015 07:47  Glucose-Capillary Latest Ref Range: 65-99 mg/dL 409460 (H) 811345 (H) 914318 (H) 309 (H) 300 (H)  Results for Charletta CousinJONES, Roy D (MRN [id number]) as of 09/27/2015 08:21  Ref. Range 09/26/2015 01:23  Hemoglobin A1C Latest Ref Range: 4.0-6.0 % 16.0 (H)   Review of Glycemic Control  Diabetes history: DM2 Outpatient Diabetes medications: Metformin 500 mg QAM (However, H&P indicates patient has not taken in about 2 months) Current orders for Inpatient glycemic control: Lantus 15 units QHS, Novolog 0-15 units TID with meals  Inpatient Diabetes Program Recommendations: Insulin - Basal: Patient recieved Lantus 15 units on 09/26/15 at 6:08 am and Lantus 15 units on 09/27/15 at 2:07 am. Fasting glucose is 300 mg/dl this morning. Since patient has no insurance and will need an affordable insulin regimen, please consider discontinuing Lantus and ordering 70/30 25 units BID starting now (which will provide 35 units for basal and 15 units for meal coverage per day). Correction (SSI): Please consider ordering Novolog bedtime correction scale. HgbA1C: A1C 16.0% on 09/26/15.  Thank,s Orlando PennerMarie Marleena Shubert, RN, MSN, CDE Diabetes Coordinator Inpatient Diabetes Program [phone] (Team Pager from 8am to 5pm) [phone] ([place] office) [phone] [facility] office) [phone] [facility] office)

## 2015-09-27 NOTE — Progress Notes (Signed)
Spoke with Dr. Auburn BilberryShreyang Patel and informed him that patient is complaining and nausea and his zofran is not due until 1641. Order received for Phenergan 12.5 orally q6prn

## 2015-09-27 NOTE — Progress Notes (Signed)
Spoke with Dr. Auburn BilberryShreyang Patel and informed him that patient is complaining of left sided neck and jaw pain. Patient does have left jaw swelling on assessment.

## 2015-09-27 NOTE — Progress Notes (Signed)
Pt BP 160/120 Dr patel made aware new orders received.

## 2015-09-27 NOTE — Progress Notes (Signed)
[place] [facility]                                                                                                                                                                                            Patient Demographics   Dustin Williamson, is a 45 y.o. male, DOB - 01-26-1971, FOY:[id number]  Admit date - 09/26/2015   Admitting Physician Harrie Foreman, MD  Outpatient Primary MD for the patient is [name],[name], MD   LOS - 1  Subjective: Pt admited with abdominal pain , now resolved, no further abdominal pain bg in 400's, sbp elevated, no cp, no nause and no vomting   Review of Systems:   CONSTITUTIONAL: No documented fever. No fatigue, weakness. No weight gain, no weight loss.  EYES: No blurry or double vision.  ENT: No tinnitus. No postnasal drip. No redness of the oropharynx.  RESPIRATORY: No cough, no wheeze, no hemoptysis. No dyspnea.  CARDIOVASCULAR: No chest pain. No orthopnea. No palpitations. No syncope.  GASTROINTESTINAL: No nausea, no vomiting or diarrhea. No abdominal pain. No melena or hematochezia.  GENITOURINARY: No dysuria or hematuria.  ENDOCRINE: No polyuria or nocturia. No heat or cold intolerance.  HEMATOLOGY: No anemia. No bruising. No bleeding.  INTEGUMENTARY: No rashes. No lesions.  MUSCULOSKELETAL: No arthritis. No swelling. No gout.  NEUROLOGIC: No numbness, tingling, or ataxia. No seizure-type activity.  PSYCHIATRIC: No anxiety. No insomnia. No ADD.    Vitals:   Filed Vitals:   09/27/15 0800 09/27/15 0900 09/27/15 0958 09/27/15 1107  BP: 180/140 160/128 160/120 145/98  Pulse:    86  Temp:    97.7 F (36.5 C)  TempSrc:    Oral  Resp:    18  Height:      Weight:      SpO2:    97%    Wt Readings from Last 3 Encounters:  09/27/15 104.4 kg (230 lb 2.6 oz)  06/13/15 103.42 kg (228 lb)  03/27/15 106.595 kg (235 lb)     Intake/Output Summary (Last 24 hours) at 09/27/15 1135 Last data filed at  09/27/15 0900  Gross per 24 hour  Intake   3595 ml  Output   1100 ml  Net   2495 ml    Physical Exam:   GENERAL: Pleasant-appearing in no apparent distress.  HEAD, EYES, EARS, NOSE AND THROAT: Atraumatic, normocephalic. Extraocular muscles are intact. Pupils equal and reactive to light. Sclerae anicteric. No conjunctival injection. No oro-pharyngeal erythema.  NECK: Supple. There is no jugular venous distention. No bruits, no lymphadenopathy, no thyromegaly.  HEART: Regular rate and rhythm,. No murmurs,  no rubs, no clicks.  LUNGS: Clear to auscultation bilaterally. No rales or rhonchi. No wheezes.  ABDOMEN: Soft, flat, nontender, nondistended. Has good bowel sounds. No hepatosplenomegaly appreciated.  EXTREMITIES: No evidence of any cyanosis, clubbing, or peripheral edema.  +2 pedal and radial pulses bilaterally.  NEUROLOGIC: The patient is alert, awake, and oriented x3 with no focal motor or sensory deficits appreciated bilaterally.  SKIN: Moist and warm with no rashes appreciated.  Psych: Not anxious, depressed LN: No inguinal LN enlargement    Antibiotics   Anti-infectives    None      Medications   Scheduled Meds: . cloNIDine  0.2 mg Oral TID  . docusate sodium  100 mg Oral BID  . enoxaparin (LOVENOX) injection  40 mg Subcutaneous Q24H  . hydrochlorothiazide  25 mg Oral Daily  . insulin aspart  0-15 Units Subcutaneous TID WC  . insulin aspart  20 Units Subcutaneous Once  . insulin aspart  4 Units Subcutaneous TID WC  . insulin glargine  20 Units Subcutaneous QHS  . insulin starter kit- syringes  1 kit Other Once  . living well with diabetes book   Does not apply Once  . metFORMIN  1,000 mg Oral BID WC  . propranolol ER  80 mg Oral Daily  . sodium chloride flush  3 mL Intravenous Q12H   Continuous Infusions: . sodium chloride 150 mL/hr at 09/27/15 0904   PRN Meds:.acetaminophen **OR** acetaminophen, hydrALAZINE, morphine injection, ondansetron   Data Review:    Micro Results No results found for this or any previous visit (from the past 240 hour(s)).  Radiology Reports Ct Abdomen Pelvis W Contrast  09/26/2015  CLINICAL DATA:  Left lower quadrant pain for 3 weeks, worsening in the past 3 days. Nausea and vomiting. EXAM: CT ABDOMEN AND PELVIS WITH CONTRAST TECHNIQUE: Multidetector CT imaging of the abdomen and pelvis was performed using the standard protocol following bolus administration of intravenous contrast. CONTRAST:  126m ISOVUE-300 IOPAMIDOL (ISOVUE-300) INJECTION 61% COMPARISON:  10/16/2014 FINDINGS: Lower chest:  No significant abnormality Hepatobiliary: There are normal appearances of the liver, gallbladder and bile ducts. Pancreas: Normal Spleen: Norm Adrenals/Urinary Tract: Both adrenals are normal. The right kidney is normal. The left kidney contains 2 calculi, measuring 3 x 5 mm and 7 x 8 mm. There are no ureteral calculi. There is no ureteral dilatation or hydronephrosis. The urinary bladder is unremarkable. Stomach/Bowel: There are normal appearances of the stomach, small bowel and colon. The appendix is normal. Vascular/Lymphatic: The abdominal aorta is normal in caliber. There is no atherosclerotic calcification. There is no adenopathy in the abdomen or pelvis. Reproductive: Unremarkable Other: No acute inflammatory changes are evident in the abdomen or pelvis. There is no ascites. Musculoskeletal: No significant abnormality incidentally noted L5 vertebral hemangioma. Small fat containing umbilical hernia. IMPRESSION: 1. No acute findings are evident in the abdomen or pelvis. 2. Left nephrolithiasis. 3. Fat containing umbilical hernia. Electronically Signed   By: DAndreas NewportM.D.   On: 09/26/2015 03:26     CBC  Recent Labs Lab 09/26/15 0123  WBC 10.2  HGB 17.7  HCT 51.4  PLT 212  MCV 86.5  MCH 29.8  MCHC 34.4  RDW 12.5    Chemistries   Recent Labs Lab 09/26/15 0123 09/27/15 0321  NA 122* 133*  K 3.8 3.7  CL 88* 105   CO2 20* 23  GLUCOSE 773* 304*  BUN 15 13  CREATININE 1.43* 1.14  CALCIUM 9.3 8.0*  AST 29  --  ALT 33  --   ALKPHOS 118  --   BILITOT 0.8  --    ------------------------------------------------------------------------------------------------------------------ estimated creatinine clearance is 94.2 mL/min (by C-G formula based on Cr of 1.14). ------------------------------------------------------------------------------------------------------------------  Recent Labs  09/26/15 0123  HGBA1C 16.0*   ------------------------------------------------------------------------------------------------------------------ No results for input(s): CHOL, HDL, LDLCALC, TRIG, CHOLHDL, LDLDIRECT in the last 72 hours. ------------------------------------------------------------------------------------------------------------------  Recent Labs  09/26/15 0123  TSH 1.669   ------------------------------------------------------------------------------------------------------------------ No results for input(s): VITAMINB12, FOLATE, FERRITIN, TIBC, IRON, RETICCTPCT in the last 72 hours.  Coagulation profile No results for input(s): INR, PROTIME in the last 168 hours.  No results for input(s): DDIMER in the last 72 hours.  Cardiac Enzymes  Recent Labs Lab 09/26/15 0123  TROPONINI <0.03   ------------------------------------------------------------------------------------------------------------------ Invalid input(s): POCBNP    Assessment & Plan   This is a 45 year old male admitted for hyperglycemia. 1.DMII due to med noncopliance- resume metformin which he has not been taking for 2 months I will place him on pre-meal insulin As well as increase his Lantus dose Patient's hemoglobin A1c is 16, maintaining his blood sugars have been elevated for a prolonged period of time  2. Pseudohyponatremia: Secondary to hyperglycemia. 3. Accelerated hypertension blood pressure under poor  control  Start patient on clonidine twice a day, started on HCTZ as well as his Inderal has been discontinued 4. Abdominal pain unclear etiology now resolved 5. DVT prophylaxis: Lovenox     Code Status Orders        Start     Ordered   09/26/15 1032  Full code   Continuous     09/26/15 1031    Code Status History    Date Active Date Inactive Code Status Order ID Comments User Context   This patient has a current code status but no historical code status.           Consults  social work  Prophylaxis  Lovenox  Lab Results  Component Value Date   PLT 212 09/26/2015     Time Spent in minutes  47mn  Greater than 50% of time spent in care coordination and counseling patient regarding the condition and plan of care.   PDustin FlockM.D on 09/27/2015 at 11:35 AM  Between 7am to 6pm - Pager - [phone]  After 6pm go to [email or web address] - password [email or web address] [facility]ospitalists   Office  [phone]

## 2015-09-28 ENCOUNTER — Encounter: Payer: Self-pay | Admitting: Student

## 2015-09-28 LAB — BASIC METABOLIC PANEL
Anion gap: 5 (ref 5–15)
BUN: 9 mg/dL (ref 6–20)
CALCIUM: 7.9 mg/dL — AB (ref 8.9–10.3)
CO2: 24 mmol/L (ref 22–32)
CREATININE: 0.97 mg/dL (ref 0.61–1.24)
Chloride: 100 mmol/L — ABNORMAL LOW (ref 101–111)
GFR calc non Af Amer: >60 mL/min (ref >60)
GLUCOSE: 249 mg/dL — AB (ref 65–99)
Potassium: 3.4 mmol/L — ABNORMAL LOW (ref 3.5–5.1)
Sodium: 129 mmol/L — ABNORMAL LOW (ref 135–145)

## 2015-09-28 LAB — GLUCOSE, CAPILLARY
Glucose-Capillary: 250 mg/dL — ABNORMAL HIGH (ref 65–99)
Glucose-Capillary: 255 mg/dL — ABNORMAL HIGH (ref 65–99)

## 2015-09-28 MED ORDER — INSULIN GLARGINE 100 UNIT/ML ~~LOC~~ SOLN: 24.0000 [IU] | Freq: Every day | SUBCUTANEOUS | Status: DC

## 2015-09-28 MED ORDER — PROCHLORPERAZINE EDISYLATE 5 MG/ML IJ SOLN
10.0000 mg | Freq: Once | INTRAMUSCULAR | Status: AC
  Administered 2015-09-28: 10 mg via INTRAVENOUS
  Filled 2015-09-28: qty 2

## 2015-09-28 MED ORDER — PROPRANOLOL HCL ER 80 MG PO CP24: 160.0000 mg | ORAL_CAPSULE | Freq: Every day | ORAL | Status: DC

## 2015-09-28 MED ORDER — VALPROATE SODIUM 500 MG/5ML IV SOLN
500.0000 mg | Freq: Once | INTRAVENOUS | Status: AC
  Administered 2015-09-28: 500 mg via INTRAVENOUS
  Filled 2015-09-28: qty 5

## 2015-09-28 MED ORDER — CLONIDINE HCL 0.2 MG PO TABS: 0.2000 mg | ORAL_TABLET | Freq: Three times a day (TID) | ORAL | Status: DC

## 2015-09-28 MED ORDER — METFORMIN HCL 1000 MG PO TABS: 1000.0000 mg | ORAL_TABLET | Freq: Two times a day (BID) | ORAL | Status: DC

## 2015-09-28 MED ORDER — METHOCARBAMOL 1000 MG/10ML IJ SOLN
500.0000 mg | Freq: Once | INTRAVENOUS | Status: AC
  Administered 2015-09-28: 500 mg via INTRAVENOUS
  Filled 2015-09-28: qty 5

## 2015-09-28 MED ORDER — OXYCODONE-ACETAMINOPHEN 5-325 MG PO TABS: 1.0000 | ORAL_TABLET | ORAL | Status: DC | PRN

## 2015-09-28 MED ORDER — PROPRANOLOL HCL ER 160 MG PO CP24: 160.0000 mg | ORAL_CAPSULE | Freq: Every day | ORAL | Status: DC

## 2015-09-28 MED ORDER — PROPRANOLOL HCL ER 80 MG PO CP24
80.0000 mg | ORAL_CAPSULE | Freq: Once | ORAL | Status: AC
  Administered 2015-09-28: 80 mg via ORAL
  Filled 2015-09-28: qty 1

## 2015-09-28 NOTE — Care Management (Signed)
[facility]. [facility] and placed Rx on chart for RN to give to patient. He was on phone when i rounded. i explained  This to OceanographerAmanda RN Charge Nurse. RN was in a room with another patient.  No further RNCM needs

## 2015-09-28 NOTE — Discharge Summary (Signed)
Dustin Williamson, 45 y.o., DOB 08/29/1970, MRN [id number]. Admission date: 09/26/2015 Discharge Date 09/28/2015 Primary MD Corky Downs, MD Admitting Physician Arnaldo Natal, MD  Admission Diagnosis  Hyponatremia [E87.1] Generalized abdominal pain [R10.84] Hyperglycemia [R73.9] Non-intractable vomiting with nausea, vomiting of unspecified type [R11.2]  Discharge Diagnosis   Active Problems: Diabetes type 2 poorly controlled Accelerated hypertension Med noncompliance Substance abuse based on his toxic urine drug screen from the past patient denies Neck pain without any evidence of abscess or infection likely muscle spasm Depression         Hospital Course The patient with past medical history significant for diabetes mellitus type 2 and essential hypertension presents emergency department complaining of abdominal pain and heartburn. His blood sugars were noted to be elevated. We were asked to admit the patient he was started on insulin.  He was supposed to be taking metformin but stated he did not have any insurance. His insulin regimen has been adjusted. He'll need a close follow-up. He also had accelerated hypertension which was addressed during hospitalization. Patient was complaining of having pain in his neck and face had a CT scan of this area which showed no acute abnormality. He was symptomatically treated           Consults  None  Significant Tests:  See full reports for all details      Ct Soft Tissue Neck W Contrast  09/27/2015  CLINICAL DATA:  45 year old hypertensive diabetic male complaining of left-sided neck in jaw pain. History of cocaine use in the past. Initial encounter. EXAM: CT NECK WITH CONTRAST TECHNIQUE: Multidetector CT imaging of the neck was performed using the standard protocol following the bolus administration of intravenous contrast. CONTRAST:  75mL ISOVUE-300 IOPAMIDOL (ISOVUE-300) INJECTION 61% COMPARISON:  03/27/2015. FINDINGS: Pharynx and  larynx: No worrisome primary mass or inflammation. Calcification palatine tonsils consistent with prior inflammation. Salivary glands: No worrisome mass or inflammation. Thyroid: No mass identified. Lymph nodes: Scattered normal - top-normal size lymph nodes without necrotic adenopathy. Vascular: No thrombosis internal jugular vein. No hemodynamically significant stenosis either carotid bifurcation. Limited intracranial: Negative. Visualized orbits: Not visualized. Mastoids and visualized paranasal sinuses: 9 mm polypoid opacification medial aspect left maxillary sinus. Skeleton: No destructive lesion of the mandible. Cervical spondylotic changes. Upper chest: No worrisome mass. IMPRESSION: No mandibular mass or inflammatory process noted. The full extent of the temporomandibular joint was not imaged. Electronically Signed   By: Lacy Duverney M.D.   On: 09/27/2015 14:58   Ct Abdomen Pelvis W Contrast  09/26/2015  CLINICAL DATA:  Left lower quadrant pain for 3 weeks, worsening in the past 3 days. Nausea and vomiting. EXAM: CT ABDOMEN AND PELVIS WITH CONTRAST TECHNIQUE: Multidetector CT imaging of the abdomen and pelvis was performed using the standard protocol following bolus administration of intravenous contrast. CONTRAST:  ISOVUE-300 IOPAMIDOL (ISOVUE-300) INJECTION 61% COMPARISON:  10/16/2014 FINDINGS: Lower chest:  No significant abnormality Hepatobiliary: There are normal appearances of the liver, gallbladder and bile ducts. Pancreas: Normal Spleen: Norm Adrenals/Urinary Tract: Both adrenals are normal. The right kidney is normal. The left kidney contains 2 calculi, measuring 3 x 5 mm and 7 x 8 mm. There are no ureteral calculi. There is no ureteral dilatation or hydronephrosis. The urinary bladder is unremarkable. Stomach/Bowel: There are normal appearances of the stomach, small bowel and colon. The appendix is normal. Vascular/Lymphatic: The abdominal aorta is normal in caliber. There is no  atherosclerotic calcification. There is no adenopathy in the abdomen or pelvis. Reproductive:  Unremarkable Other: No acute inflammatory changes are evident in the abdomen or pelvis. There is no ascites. Musculoskeletal: No significant abnormality incidentally noted L5 vertebral hemangioma. Small fat containing umbilical hernia. IMPRESSION: 1. No acute findings are evident in the abdomen or pelvis. 2. Left nephrolithiasis. 3. Fat containing umbilical hernia. Electronically Signed   By: Ellery Plunk M.D.   On: 09/26/2015 03:26       Today   Subjective:   Dustin Williamson  plains of pain in the right neck Objective:   Blood pressure 138/100, pulse 70, temperature 97.6 F (36.4 C), temperature source Oral, resp. rate 19, height  (1.702 m), weight 112.182 kg (247 lb 5.1 oz), SpO2 98 %.  .  Intake/Output Summary (Last 24 hours) at 09/28/15 1253 Last data filed at 09/28/15 0951  Gross per 24 hour  Intake    240 ml  Output   1300 ml  Net  -1060 ml    Exam VITAL SIGNS: Blood pressure 138/100, pulse 70, temperature 97.6 F (36.4 C), temperature source Oral, resp. rate 19, height  (1.702 m), weight 112.182 kg (247 lb 5.1 oz), SpO2 98 %.  GENERAL:  45 y.o.-year-old patient lying in the bed with no acute distress.  EYES: Pupils equal, round, reactive to light and accommodation. No scleral icterus. Extraocular muscles intact.  HEENT: Head atraumatic, normocephalic. Oropharynx and nasopharynx clear.  NECK:  Supple, no jugular venous distention. No thyroid enlargement, no tenderness.  LUNGS: Normal breath sounds bilaterally, no wheezing, rales,rhonchi or crepitation. No use of accessory muscles of respiration.  CARDIOVASCULAR: S1, S2 normal. No murmurs, rubs, or gallops.  ABDOMEN: Soft, nontender, nondistended. Bowel sounds present. No organomegaly or mass.  EXTREMITIES: No pedal edema, cyanosis, or clubbing.  NEUROLOGIC: Cranial nerves II through XII are intact. Muscle strength 5/5 in  all extremities. Sensation intact. Gait not checked.  PSYCHIATRIC: The patient is alert and oriented x 3.  SKIN: No obvious rash, lesion, or ulcer.   Data Review     CBC w Diff: Lab Results  Component Value Date   WBC 10.2 09/26/2015   WBC 9.5 10/16/2014   HGB 17.7 09/26/2015   HGB 16.4 10/16/2014   HCT 51.4 09/26/2015   HCT 48.7 10/16/2014   PLT 212 09/26/2015   PLT 219 10/16/2014   LYMPHOPCT 43.8 10/16/2014   LYMPHOPCT 37 03/05/2009   MONOPCT 7.9 10/16/2014   MONOPCT 5 03/05/2009   EOSPCT 1.4 10/16/2014   EOSPCT 1 03/05/2009   BASOPCT 0.7 10/16/2014   BASOPCT 1 03/05/2009   CMP: Lab Results  Component Value Date   NA 129* 09/28/2015   NA 133* 10/16/2014   K 3.4* 09/28/2015   K 3.7 10/16/2014   CL 100* 09/28/2015   CL 104 10/16/2014   CO2 24 09/28/2015   CO2 22 10/16/2014   BUN 9 09/28/2015   BUN 17 10/16/2014   CREATININE 0.97 09/28/2015   CREATININE 1.63* 10/16/2014   PROT 8.0 09/26/2015   PROT 7.9 10/16/2014   ALBUMIN 4.4 09/26/2015   ALBUMIN 4.7 10/16/2014   BILITOT 0.8 09/26/2015   BILITOT 0.4 10/16/2014   ALKPHOS 118 09/26/2015   ALKPHOS 85 10/16/2014   AST 29 09/26/2015   AST 23 10/16/2014   ALT 33 09/26/2015   ALT 23 10/16/2014  .  Micro Results No results found for this or any previous visit (from the past 240 hour(s)).      Code Status Orders        Start  Ordered   09/26/15 1032  Full code   Continuous     09/26/15 1031    Code Status History    Date Active Date Inactive Code Status Order ID Comments User Context   This patient has a current code status but no historical code status.          Follow-up Information    [facility] up with [facility] On 11/02/2015.   Specialty:  General Practice   Why:  at 1pm   Contact information:   [address] [place] [address] [phone]       Discharge Medications     Medication List    TAKE these medications        cloNIDine 0.2 MG tablet   Commonly known as:  CATAPRES  Take 1 tablet (0.2 mg total) by mouth 3 (three) times daily.     dicyclomine 20 MG tablet  Commonly known as:  BENTYL  Take 1 tablet (20 mg total) by mouth 3 (three) times daily as needed for spasms.     insulin glargine 100 UNIT/ML injection  Commonly known as:  LANTUS  Inject 0.24 mLs (24 Units total) into the skin at bedtime.     metFORMIN 1000 MG tablet  Commonly known as:  GLUCOPHAGE  Take 1 tablet (1,000 mg total) by mouth 2 (two) times daily with a meal.     oxyCODONE-acetaminophen 5-325 MG tablet  Commonly known as:  PERCOCET/ROXICET  Take 1 tablet by mouth every 4 (four) hours as needed for moderate pain.     propranolol ER 160 MG SR capsule  Commonly known as:  INDERAL LA  Take 1 capsule (160 mg total) by mouth daily.  Start taking on:  09/29/2015           Total Time in preparing paper work, data evaluation and todays exam - 35 minutes  Auburn BilberryPATEL, Tacie Mccuistion M.D on 09/28/2015 at 12:53 PM  [facility]   Office  [phone]

## 2015-09-28 NOTE — Progress Notes (Signed)
Patient is to be discharged home today. Patient is in no acute distress at this time, and assessment is unchanged from this morning. Patient's IV is out, discharge paperwork has been discussed with patient/family and there are no questions or concerns at this time. Patient will be accompanied downstairs by staff and family via wheelchair.   

## 2015-10-30 ENCOUNTER — Emergency Department
Admission: EM | Admit: 2015-10-30 | Discharge: 2015-10-30 | Disposition: A | Discharge status: Home / Self Care | Payer: MEDICAID | Attending: Emergency Medicine | Admitting: Emergency Medicine

## 2015-10-30 ENCOUNTER — Encounter: Payer: Self-pay | Admitting: Emergency Medicine

## 2015-10-30 DIAGNOSIS — R739 Hyperglycemia, unspecified: Secondary | ICD-10-CM

## 2015-10-30 DIAGNOSIS — F329 Major depressive disorder, single episode, unspecified: Secondary | ICD-10-CM | POA: Insufficient documentation

## 2015-10-30 DIAGNOSIS — Z794 Long term (current) use of insulin: Secondary | ICD-10-CM | POA: Insufficient documentation

## 2015-10-30 DIAGNOSIS — I10 Essential (primary) hypertension: Secondary | ICD-10-CM | POA: Insufficient documentation

## 2015-10-30 DIAGNOSIS — Z760 Encounter for issue of repeat prescription: Secondary | ICD-10-CM

## 2015-10-30 DIAGNOSIS — Z79899 Other long term (current) drug therapy: Secondary | ICD-10-CM | POA: Insufficient documentation

## 2015-10-30 DIAGNOSIS — E1165 Type 2 diabetes mellitus with hyperglycemia: Secondary | ICD-10-CM | POA: Insufficient documentation

## 2015-10-30 LAB — URINALYSIS COMPLETE WITH MICROSCOPIC (ARMC ONLY)
BILIRUBIN URINE: NEGATIVE
Bacteria, UA: NONE SEEN
Glucose, UA: >500 mg/dL — AB
HGB URINE DIPSTICK: NEGATIVE
KETONES UR: NEGATIVE mg/dL
LEUKOCYTES UA: NEGATIVE
Nitrite: NEGATIVE
PH: 5 (ref 5.0–8.0)
Protein, ur: NEGATIVE mg/dL
SQUAMOUS EPITHELIAL / LPF: NONE SEEN
Specific Gravity, Urine: 1.023 (ref 1.005–1.030)
WBC, UA: NONE SEEN WBC/hpf (ref 0–5)

## 2015-10-30 LAB — GLUCOSE, CAPILLARY
Glucose-Capillary: 516 mg/dL — ABNORMAL HIGH (ref 65–99)
Glucose-Capillary: 380 mg/dL — ABNORMAL HIGH (ref 65–99)
Glucose-Capillary: 304 mg/dL — ABNORMAL HIGH (ref 65–99)

## 2015-10-30 LAB — BASIC METABOLIC PANEL
ANION GAP: 7 (ref 5–15)
BUN: 18 mg/dL (ref 6–20)
CO2: 24 mmol/L (ref 22–32)
Calcium: 8.9 mg/dL (ref 8.9–10.3)
Chloride: 98 mmol/L — ABNORMAL LOW (ref 101–111)
Creatinine, Ser: 1.24 mg/dL (ref 0.61–1.24)
GFR calc Af Amer: >60 mL/min (ref >60)
Glucose, Bld: 536 mg/dL (ref 65–99)
POTASSIUM: 4.7 mmol/L (ref 3.5–5.1)
SODIUM: 129 mmol/L — AB (ref 135–145)

## 2015-10-30 LAB — CBC
HEMATOCRIT: 47.6 % (ref 40.0–52.0)
HEMOGLOBIN: 16.4 g/dL (ref 13.0–18.0)
MCH: 30.4 pg (ref 26.0–34.0)
MCHC: 34.5 g/dL (ref 32.0–36.0)
MCV: 88.2 fL (ref 80.0–100.0)
PLATELETS: 229 10*3/uL (ref 150–440)
RBC: 5.39 MIL/uL (ref 4.40–5.90)
RDW: 13 % (ref 11.5–14.5)
WBC: 7.5 10*3/uL (ref 3.8–10.6)

## 2015-10-30 MED ORDER — SODIUM CHLORIDE 0.9 % IV SOLN
1000.0000 mL | Freq: Once | INTRAVENOUS | Status: AC
  Administered 2015-10-30: 1000 mL via INTRAVENOUS

## 2015-10-30 MED ORDER — METFORMIN HCL 1000 MG PO TABS: 1000.0000 mg | ORAL_TABLET | Freq: Two times a day (BID) | ORAL | Status: DC

## 2015-10-30 MED ORDER — SODIUM CHLORIDE 0.9 % IV BOLUS (SEPSIS)
1000.0000 mL | Freq: Once | INTRAVENOUS | Status: AC
  Administered 2015-10-30: 1000 mL via INTRAVENOUS

## 2015-10-30 MED ORDER — PROPRANOLOL HCL ER 160 MG PO CP24
160.0000 mg | ORAL_CAPSULE | Freq: Every day | ORAL | Status: DC
  Administered 2015-10-30: 160 mg via ORAL
  Filled 2015-10-30: qty 1

## 2015-10-30 MED ORDER — METFORMIN HCL 500 MG PO TABS
1000.0000 mg | ORAL_TABLET | Freq: Once | ORAL | Status: AC
  Administered 2015-10-30: 1000 mg via ORAL
  Filled 2015-10-30: qty 2

## 2015-10-30 MED ORDER — PROPRANOLOL HCL ER 160 MG PO CP24: 160.0000 mg | ORAL_CAPSULE | Freq: Every day | ORAL | Status: DC

## 2015-10-30 NOTE — ED Notes (Signed)
Report given to Rebecca, RN

## 2015-10-30 NOTE — Discharge Instructions (Signed)
Hyperglycemia °Hyperglycemia occurs when the glucose (sugar) in your blood is too high. Hyperglycemia can happen for many reasons, but it most often happens to people who do not know they have diabetes or are not managing their diabetes properly.  °CAUSES  °Whether you have diabetes or not, there are other causes of hyperglycemia. Hyperglycemia can occur when you have diabetes, but it can also occur in other situations that you might not be as aware of, such as: °Diabetes °· If you have diabetes and are having problems controlling your blood glucose, hyperglycemia could occur because of some of the following reasons: °¨ Not following your meal plan. °¨ Not taking your diabetes medications or not taking it properly. °¨ Exercising less or doing less activity than you normally do. °¨ Being sick. °Pre-diabetes °· This cannot be ignored. Before people develop Type 2 diabetes, they almost always have "pre-diabetes." This is when your blood glucose levels are higher than normal, but not yet high enough to be diagnosed as diabetes. Research has shown that some long-term damage to the body, especially the heart and circulatory system, may already be occurring during pre-diabetes. If you take action to manage your blood glucose when you have pre-diabetes, you may delay or prevent Type 2 diabetes from developing. °Stress °· If you have diabetes, you may be "diet" controlled or on oral medications or insulin to control your diabetes. However, you may find that your blood glucose is higher than usual in the hospital whether you have diabetes or not. This is often referred to as "stress hyperglycemia." Stress can elevate your blood glucose. This happens because of hormones put out by the body during times of stress. If stress has been the cause of your high blood glucose, it can be followed regularly by your caregiver. That way he/she can make sure your hyperglycemia does not continue to get worse or progress to  diabetes. °Steroids °· Steroids are medications that act on the infection fighting system (immune system) to block inflammation or infection. One side effect can be a rise in blood glucose. Most people can produce enough extra insulin to allow for this rise, but for those who cannot, steroids make blood glucose levels go even higher. It is not unusual for steroid treatments to "uncover" diabetes that is developing. It is not always possible to determine if the hyperglycemia will go away after the steroids are stopped. A special blood test called an A1c is sometimes done to determine if your blood glucose was elevated before the steroids were started. °SYMPTOMS °· Thirsty. °· Frequent urination. °· Dry mouth. °· Blurred vision. °· Tired or fatigue. °· Weakness. °· Sleepy. °· Tingling in feet or leg. °DIAGNOSIS  °Diagnosis is made by monitoring blood glucose in one or all of the following ways: °· A1c test. This is a chemical found in your blood. °· Fingerstick blood glucose monitoring. °· Laboratory results. °TREATMENT  °First, knowing the cause of the hyperglycemia is important before the hyperglycemia can be treated. Treatment may include, but is not be limited to: °· Education. °· Change or adjustment in medications. °· Change or adjustment in meal plan. °· Treatment for an illness, infection, etc. °· More frequent blood glucose monitoring. °· Change in exercise plan. °· Decreasing or stopping steroids. °· Lifestyle changes. °HOME CARE INSTRUCTIONS  °· Test your blood glucose as directed. °· Exercise regularly. Your caregiver will give you instructions about exercise. Pre-diabetes or diabetes which comes on with stress is helped by exercising. °· Eat wholesome,   balanced meals. Eat often and at regular, fixed times. Your caregiver or nutritionist will give you a meal plan to guide your sugar intake. °· Being at an ideal weight is important. If needed, losing as little as 10 to 15 pounds may help improve blood  glucose levels. °SEEK MEDICAL CARE IF:  °· You have questions about medicine, activity, or diet. °· You continue to have symptoms (problems such as increased thirst, urination, or weight gain). °SEEK IMMEDIATE MEDICAL CARE IF:  °· You are vomiting or have diarrhea. °· Your breath smells fruity. °· You are breathing faster or slower. °· You are very sleepy or incoherent. °· You have numbness, tingling, or pain in your feet or hands. °· You have chest pain. °· Your symptoms get worse even though you have been following your caregiver's orders. °· If you have any other questions or concerns. °  °This information is not intended to replace advice given to you by your health care provider. Make sure you discuss any questions you have with your health care provider. °  °Document Released: 11/29/2000 Document Revised: 08/28/2011 Document Reviewed: 02/09/2015 °Elsevier Interactive Patient Education ©2016 [employer] ° °

## 2015-10-30 NOTE — ED Notes (Signed)
Elevated blood sugar.  Seen recently for same.  Ran out of metformin and propanolol on Thursday.  Has doctor's appointment scheduled for Tuesday.

## 2015-10-30 NOTE — ED Notes (Signed)
Patient states that he was seen about 1 month ago for his blood sugar, patient was given RXs at that time but ran out of his metformin and propranolol on Thursday. Patient states that on Friday his blood sugars started running high, patient called his PCP and they advised him to come in to be seen at the ED.  Patient states that he has also been blurry vision and headaches for the past 2 days.

## 2015-10-30 NOTE — ED Provider Notes (Signed)
[facility] Emergency Department Provider Note  ____________________________________________    I have reviewed the triage vital signs and the nursing notes.   HISTORY  Chief Complaint Hyperglycemia and Hypertension    HPI Dustin Williamson is a 45 y.o. male who presents with complaints of elevated blood sugar. Patient reports he ran out of his metformin 3 days ago. Overall he feels well although he does complain of mild blurry vision. No fevers or chills. No cough or shortness of breath. No nausea or vomiting. No Neuro deficits     Past Medical History  Diagnosis Date  . Diabetes mellitus without complication (HCC)   . Hypertension   . Depressed     Patient Active Problem List   Diagnosis Date Noted  . Hyperglycemia 09/26/2015  . CLOSED FRACTURE OF BASE OF OTHER METACARPAL BONE 03/09/2009    Past Surgical History  Procedure Laterality Date  . Hernia repair      Current Outpatient Rx  Name  Route  Sig  Dispense  Refill  . cloNIDine (CATAPRES) 0.2 MG tablet   Oral   Take 1 tablet (0.2 mg total) by mouth 3 (three) times daily.   60 tablet   11   . insulin glargine (LANTUS) 100 UNIT/ML injection   Subcutaneous   Inject 0.24 mLs (24 Units total) into the skin at bedtime.   10 mL   11   . dicyclomine (BENTYL) 20 MG tablet   Oral   Take 1 tablet (20 mg total) by mouth 3 (three) times daily as needed for spasms. Patient not taking: Reported on 09/26/2015   20 tablet   0   . metFORMIN (GLUCOPHAGE) 1000 MG tablet   Oral   Take 1 tablet (1,000 mg total) by mouth 2 (two) times daily with a meal.   10 tablet   0   . oxyCODONE-acetaminophen (PERCOCET/ROXICET) 5-325 MG tablet   Oral   Take 1 tablet by mouth every 4 (four) hours as needed for moderate pain.   20 tablet   0   . propranolol ER (INDERAL LA) 160 MG SR capsule   Oral   Take 1 capsule (160 mg total) by mouth daily.   30 capsule   0     Allergies Review of patient's  allergies indicates no known allergies.  Family History  Problem Relation Age of Onset  . Diabetes Mellitus II Father     Social History Social History  Substance Use Topics  . Smoking status: Never Smoker   . Smokeless tobacco: Never Used  . Alcohol Use: No    Review of Systems  Constitutional: Negative for fever. Eyes: Positive for blurry vision ENT: Negative for sore throat Cardiovascular: Negative for chest pain Respiratory: Negative for shortness of breath. Gastrointestinal: Negative for abdominal pain Genitourinary: Negative for dysuria. Musculoskeletal: Negative for back pain. Skin: Negative for rash. Neurological: Negative for focal weakness Psychiatric: no anxiety    ____________________________________________   PHYSICAL EXAM:  VITAL SIGNS: ED Triage Vitals  Enc Vitals Group     BP 10/30/15 1700 145/106 mmHg     Pulse Rate 10/30/15 1700 107     Resp 10/30/15 1700 17     Temp 10/30/15 1700 97.9 F (36.6 C)     Temp Source 10/30/15 1700 Oral     SpO2 10/30/15 1700 95 %     Weight 10/30/15 1700 234 lb (106.142 kg)     Height 10/30/15 1700  (1.702 m)  Head Cir --      Peak Flow --      Pain Score 10/30/15 1701 0     Pain Loc --      Pain Edu? --      Excl. in GC? --     Constitutional: Alert and oriented. Well appearing and in no distress.  Eyes: Conjunctivae are normal. No erythema or injection ENT   Head: Normocephalic and atraumatic.   Mouth/Throat: Mucous membranes are moist. Cardiovascular: Normal rate, regular rhythm. Normal and symmetric distal pulses are present in the upper extremities.  Respiratory: Normal respiratory effort without tachypnea nor retractions. Breath sounds are clear and equal bilaterally.  Gastrointestinal: Soft and non-tender in all quadrants. No distention. There is no CVA tenderness. Genitourinary: deferred Musculoskeletal: Nontender with normal range of motion in all extremities. No lower extremity  tenderness nor edema. Neurologic:  Normal speech and language. No gross focal neurologic deficits are appreciated. Skin:  Skin is warm, dry and intact. No rash noted. Psychiatric: Mood and affect are normal. Patient exhibits appropriate insight and judgment.  ____________________________________________    LABS (pertinent positives/negatives)  Labs Reviewed  BASIC METABOLIC PANEL - Abnormal; Notable for the following:    Sodium 129 (*)    Chloride 98 (*)    Glucose, Bld 536 (*)    All other components within normal limits  URINALYSIS COMPLETEWITH MICROSCOPIC ([facility] ONLY) - Abnormal; Notable for the following:    Color, Urine STRAW (*)    APPearance CLEAR (*)    Glucose, UA >500 (*)    All other components within normal limits  GLUCOSE, CAPILLARY - Abnormal; Notable for the following:    Glucose-Capillary 516 (*)    All other components within normal limits  GLUCOSE, CAPILLARY - Abnormal; Notable for the following:    Glucose-Capillary 380 (*)    All other components within normal limits  GLUCOSE, CAPILLARY - Abnormal; Notable for the following:    Glucose-Capillary 304 (*)    All other components within normal limits  CBC  CBG MONITORING, ED  CBG MONITORING, ED    ____________________________________________   EKG  None  ____________________________________________    RADIOLOGY  None  ____________________________________________   PROCEDURES  Procedure(s) performed: none  Critical Care performed:none  ____________________________________________   INITIAL IMPRESSION / ASSESSMENT AND PLAN / ED COURSE  Pertinent labs & imaging results that were available during my care of the patient were reviewed by me and considered in my medical decision making (see chart for details).  Patient presents with hyperglycemia. His anion gap is normal. Additional lab work is unremarkable. He is well-appearing and in no distress. Afebrile. We will treat with IV fluids,  lower sugar and refill his prescriptions.  ____________________________________________   FINAL CLINICAL IMPRESSION(S) / ED DIAGNOSES  Final diagnoses:  Hyperglycemia  Medication refill          Jene Everyobert Berdene Askari, MD 10/30/15 2249

## 2016-04-16 ENCOUNTER — Emergency Department
Admission: EM | Admit: 2016-04-16 | Discharge: 2016-04-16 | Disposition: A | Discharge status: Home / Self Care | Payer: Self-pay | Attending: Emergency Medicine | Admitting: Emergency Medicine

## 2016-04-16 ENCOUNTER — Emergency Department: Payer: Self-pay

## 2016-04-16 DIAGNOSIS — R319 Hematuria, unspecified: Secondary | ICD-10-CM

## 2016-04-16 DIAGNOSIS — E119 Type 2 diabetes mellitus without complications: Secondary | ICD-10-CM | POA: Insufficient documentation

## 2016-04-16 DIAGNOSIS — Z794 Long term (current) use of insulin: Secondary | ICD-10-CM | POA: Insufficient documentation

## 2016-04-16 DIAGNOSIS — N201 Calculus of ureter: Secondary | ICD-10-CM | POA: Insufficient documentation

## 2016-04-16 DIAGNOSIS — Z79899 Other long term (current) drug therapy: Secondary | ICD-10-CM | POA: Insufficient documentation

## 2016-04-16 DIAGNOSIS — I10 Essential (primary) hypertension: Secondary | ICD-10-CM | POA: Insufficient documentation

## 2016-04-16 DIAGNOSIS — Z7984 Long term (current) use of oral hypoglycemic drugs: Secondary | ICD-10-CM | POA: Insufficient documentation

## 2016-04-16 LAB — CBC
HEMATOCRIT: 49 % (ref 40.0–52.0)
Hemoglobin: 16.4 g/dL (ref 13.0–18.0)
MCH: 30.4 pg (ref 26.0–34.0)
MCHC: 33.5 g/dL (ref 32.0–36.0)
MCV: 90.9 fL (ref 80.0–100.0)
PLATELETS: 233 10*3/uL (ref 150–440)
RBC: 5.39 MIL/uL (ref 4.40–5.90)
RDW: 13 % (ref 11.5–14.5)
WBC: 6.3 10*3/uL (ref 3.8–10.6)

## 2016-04-16 LAB — BASIC METABOLIC PANEL
ANION GAP: 5 (ref 5–15)
BUN: 10 mg/dL (ref 6–20)
CALCIUM: 8.8 mg/dL — AB (ref 8.9–10.3)
CO2: 24 mmol/L (ref 22–32)
Chloride: 104 mmol/L (ref 101–111)
Creatinine, Ser: 1.22 mg/dL (ref 0.61–1.24)
GLUCOSE: 334 mg/dL — AB (ref 65–99)
POTASSIUM: 4.4 mmol/L (ref 3.5–5.1)
Sodium: 133 mmol/L — ABNORMAL LOW (ref 135–145)

## 2016-04-16 LAB — URINALYSIS COMPLETE WITH MICROSCOPIC (ARMC ONLY)
BACTERIA UA: NONE SEEN
Bilirubin Urine: NEGATIVE
Leukocytes, UA: NEGATIVE
Nitrite: NEGATIVE
PROTEIN: 100 mg/dL — AB
Specific Gravity, Urine: 1.033 — ABNORMAL HIGH (ref 1.005–1.030)
pH: 5 (ref 5.0–8.0)

## 2016-04-16 LAB — GLUCOSE, CAPILLARY: Glucose-Capillary: 315 mg/dL — ABNORMAL HIGH (ref 65–99)

## 2016-04-16 MED ORDER — MORPHINE SULFATE (PF) 4 MG/ML IV SOLN
INTRAVENOUS | Status: AC
  Administered 2016-04-16: 4 mg via INTRAVENOUS
  Filled 2016-04-16: qty 1

## 2016-04-16 MED ORDER — MORPHINE SULFATE (PF) 4 MG/ML IV SOLN
4.0000 mg | Freq: Once | INTRAVENOUS | Status: AC
  Administered 2016-04-16: 4 mg via INTRAVENOUS

## 2016-04-16 MED ORDER — KETOROLAC TROMETHAMINE 30 MG/ML IJ SOLN
15.0000 mg | Freq: Once | INTRAMUSCULAR | Status: AC
  Administered 2016-04-16: 15 mg via INTRAVENOUS

## 2016-04-16 MED ORDER — MORPHINE SULFATE (PF) 4 MG/ML IV SOLN
4.0000 mg | Freq: Once | INTRAVENOUS | Status: AC
Start: 2016-04-16 — End: 2016-04-16
  Administered 2016-04-16: 4 mg via INTRAVENOUS

## 2016-04-16 MED ORDER — OXYCODONE-ACETAMINOPHEN 5-325 MG PO TABS: 1.0000 | ORAL_TABLET | Freq: Four times a day (QID) | ORAL | 0 refills | Status: DC | PRN

## 2016-04-16 MED ORDER — FENTANYL CITRATE (PF) 100 MCG/2ML IJ SOLN
50.0000 ug | Freq: Once | INTRAMUSCULAR | Status: AC
  Administered 2016-04-16: 50 ug via INTRAVENOUS

## 2016-04-16 MED ORDER — TAMSULOSIN HCL 0.4 MG PO CAPS: 0.4000 mg | ORAL_CAPSULE | Freq: Every day | ORAL | 0 refills | Status: DC

## 2016-04-16 MED ORDER — SODIUM CHLORIDE 0.9 % IV BOLUS (SEPSIS)
500.0000 mL | Freq: Once | INTRAVENOUS | Status: AC
  Administered 2016-04-16: 500 mL via INTRAVENOUS

## 2016-04-16 MED ORDER — ONDANSETRON HCL 4 MG/2ML IJ SOLN
4.0000 mg | Freq: Once | INTRAMUSCULAR | Status: AC
  Administered 2016-04-16: 4 mg via INTRAVENOUS
  Filled 2016-04-16: qty 2

## 2016-04-16 MED ORDER — FENTANYL CITRATE (PF) 100 MCG/2ML IJ SOLN
INTRAMUSCULAR | Status: AC
  Filled 2016-04-16: qty 2

## 2016-04-16 MED ORDER — PROMETHAZINE HCL 25 MG PO TABS: 25.0000 mg | ORAL_TABLET | Freq: Four times a day (QID) | ORAL | 0 refills | Status: DC | PRN

## 2016-04-16 MED ORDER — SODIUM CHLORIDE 0.9 % IV BOLUS (SEPSIS)
1000.0000 mL | Freq: Once | INTRAVENOUS | Status: AC
  Administered 2016-04-16: 1000 mL via INTRAVENOUS

## 2016-04-16 MED ORDER — KETOROLAC TROMETHAMINE 30 MG/ML IJ SOLN
INTRAMUSCULAR | Status: AC
  Administered 2016-04-16: 15 mg via INTRAVENOUS
  Filled 2016-04-16: qty 1

## 2016-04-16 MED ORDER — ONDANSETRON HCL 4 MG/2ML IJ SOLN
INTRAMUSCULAR | Status: AC
  Administered 2016-04-16: 4 mg via INTRAVENOUS
  Filled 2016-04-16: qty 2

## 2016-04-16 MED ORDER — NAPROXEN 500 MG PO TABS: 500.0000 mg | ORAL_TABLET | Freq: Two times a day (BID) | ORAL | 0 refills | Status: DC

## 2016-04-16 MED ORDER — ONDANSETRON HCL 4 MG/2ML IJ SOLN
4.0000 mg | Freq: Once | INTRAMUSCULAR | Status: AC
  Administered 2016-04-16: 4 mg via INTRAVENOUS

## 2016-04-16 NOTE — Discharge Instructions (Signed)
Your CT scan shows a 5mm stone in the left ureter.  Follow up with your primary care doctor. If your symptoms do not resolve within a week, call urology.

## 2016-04-16 NOTE — ED Notes (Signed)
Mother at bedside.

## 2016-04-16 NOTE — ED Notes (Signed)
Patient has had d/c instructions reviewed. Pt has also been instructed he cannot drive. Pt has called mom for ride. Pt will be given d/c instructions once his ride (mom) gets here and we physically see her

## 2016-04-16 NOTE — ED Provider Notes (Signed)
[facility] Emergency Department Provider Note  ____________________________________________  Time seen: Approximately 4:39 PM  I have reviewed the triage vital signs and the nursing notes.   HISTORY  Chief Complaint Flank Pain    HPI Dustin Williamson is a 45 y.o. male with a history of kidney stones requiring urologic intervention who complains of left flank pain radiating to his left groin that started this morning awakening him from sleep. Denies dysuria frequency urgency hematuria. No fevers or chills. No chest pain shortness of breath, but has vomited today.     Past Medical History:  Diagnosis Date  . Depressed   . Diabetes mellitus without complication (HCC)   . Hypertension      Patient Active Problem List   Diagnosis Date Noted  . Hyperglycemia 09/26/2015  . CLOSED FRACTURE OF BASE OF OTHER METACARPAL BONE 03/09/2009     Past Surgical History:  Procedure Laterality Date  . HERNIA REPAIR       Prior to Admission medications   Medication Sig Start Date End Date Taking? Authorizing Provider  cloNIDine (CATAPRES) 0.2 MG tablet Take 1 tablet (0.2 mg total) by mouth 3 (three) times daily. 09/28/15   Auburn Bilberry, MD  dicyclomine (BENTYL) 20 MG tablet Take 1 tablet (20 mg total) by mouth 3 (three) times daily as needed for spasms. Patient not taking: Reported on 09/26/2015 02/15/15 02/15/16  Phineas Semen, MD  insulin glargine (LANTUS) 100 UNIT/ML injection Inject 0.24 mLs (24 Units total) into the skin at bedtime. 09/28/15   Auburn Bilberry, MD  metFORMIN (GLUCOPHAGE) 1000 MG tablet Take 1 tablet (1,000 mg total) by mouth 2 (two) times daily with a meal. 10/30/15 10/30/15  Jene Every, MD  naproxen (NAPROSYN) 500 MG tablet Take 1 tablet (500 mg total) by mouth 2 (two) times daily with a meal. 04/16/16   Sharman Cheek, MD  oxyCODONE-acetaminophen (ROXICET) 5-325 MG tablet Take 1 tablet by mouth every 6 (six) hours as needed for severe pain.  04/16/16   Sharman Cheek, MD  promethazine (PHENERGAN) 25 MG tablet Take 1 tablet (25 mg total) by mouth every 6 (six) hours as needed for nausea or vomiting. 04/16/16   Sharman Cheek, MD  propranolol ER (INDERAL LA) 160 MG SR capsule Take 1 capsule (160 mg total) by mouth daily. 10/30/15   Jene Every, MD  tamsulosin (FLOMAX) 0.4 MG CAPS capsule Take 1 capsule (0.4 mg total) by mouth daily. 04/16/16   Sharman Cheek, MD     Allergies Review of patient's allergies indicates no known allergies.   Family History  Problem Relation Age of Onset  . Diabetes Mellitus II Father     Social History Social History  Substance Use Topics  . Smoking status: Never Smoker  . Smokeless tobacco: Never Used  . Alcohol use No    Review of Systems  Constitutional:   No fever or chills.  Cardiovascular:   No chest pain. Respiratory:   No dyspnea or cough. Gastrointestinal:   Positive abdominal pain and vomiting as above. No diarrhea.  Genitourinary:   Negative for dysuria or difficulty urinating.  10-point ROS otherwise negative.  ____________________________________________   PHYSICAL EXAM:  VITAL SIGNS: ED Triage Vitals  Enc Vitals Group     BP 04/16/16 1523 (!) 157/109     Pulse Rate 04/16/16 1523 66     Resp 04/16/16 1523 18     Temp 04/16/16 1523 98.1 F (36.7 C)     Temp Source 04/16/16 1523 Oral  SpO2 04/16/16 1523 97 %     Weight 04/16/16 1524 230 lb (104.3 kg)     Height 04/16/16 1524 5\' 6"  (1.676 m)     Head Circumference --      Peak Flow --      Pain Score 04/16/16 1527 10     Pain Loc --      Pain Edu? --      Excl. in GC? --     Vital signs reviewed, nursing assessments reviewed.   Constitutional:   Alert and oriented. Well appearing and in no distress. Eyes:   No scleral icterus. No conjunctival pallor. PERRL. EOMI.  No nystagmus. ENT   Head:   Normocephalic and atraumatic.   Nose:   No congestion/rhinnorhea. No septal hematoma    Mouth/Throat:   MMM, no pharyngeal erythema. No peritonsillar mass.    Neck:   No stridor. No SubQ emphysema. No meningismus. Hematological/Lymphatic/Immunilogical:   No cervical lymphadenopathy. Cardiovascular:   RRR. Symmetric bilateral radial and DP pulses.  No murmurs.  Respiratory:   Normal respiratory effort without tachypnea nor retractions. Breath sounds are clear and equal bilaterally. No wheezes/rales/rhonchi. Gastrointestinal:   Soft With diffuse left-sided tenderness. Non distended. There is no CVA tenderness.  No rebound, rigidity, or guarding. No hernias Genitourinary:   Normal Musculoskeletal:   Nontender with normal range of motion in all extremities. No joint effusions.  No lower extremity tenderness.  No edema. Neurologic:   Normal speech and language.  CN 2-10 normal. Motor grossly intact. No gross focal neurologic deficits are appreciated.  Skin:    Skin is warm, dry and intact. No rash noted.  No petechiae, purpura, or bullae.  ____________________________________________    LABS (pertinent positives/negatives) (all labs ordered are listed, but only abnormal results are displayed) Labs Reviewed  URINALYSIS COMPLETEWITH MICROSCOPIC ([facility] ONLY) - Abnormal; Notable for the following:       Result Value   Color, Urine YELLOW (*)    APPearance CLEAR (*)    Glucose, UA >500 (*)    Ketones, ur TRACE (*)    Specific Gravity, Urine 1.033 (*)    Hgb urine dipstick 3+ (*)    Protein, ur 100 (*)    Squamous Epithelial / LPF 0-5 (*)    All other components within normal limits  BASIC METABOLIC PANEL - Abnormal; Notable for the following:    Sodium 133 (*)    Glucose, Bld 334 (*)    Calcium 8.8 (*)    All other components within normal limits  GLUCOSE, CAPILLARY - Abnormal; Notable for the following:    Glucose-Capillary 315 (*)    All other components within normal limits  CBC    ____________________________________________   EKG    ____________________________________________    RADIOLOGY  CT abdomen and pelvis shows a 5 mm stone in the left ureter with hydronephrosis.  ____________________________________________   PROCEDURES Procedures  ____________________________________________   INITIAL IMPRESSION / ASSESSMENT AND PLAN / ED COURSE  Pertinent labs & imaging results that were available during my care of the patient were reviewed by me and considered in my medical decision making (see chart for details).  Left-sided abdominal pain with tenderness in the patient was hypertension and diabetes. We'll get a CT scan to evaluate for infectious complication versus obstructive kidney stone. He is not septic, not in distress and low suspicion for vascular catastrophe. We'll give IV fluids morphine and Toradol for symptom relief at this time.     Clinical Course  Comment By Time  Pt updated on results. Awaiting UA given CT findings. Plan for f/u with urology and sx management.  Sharman CheekPhillip Brodie Correll, MD 10/29 1752    ----------------------------------------- 6:29 PM on 04/16/2016 -----------------------------------------  UA negative. Tolerating oral intake. Calm and comfortable. Discharge home with Phenergan and Percocet naproxen and Flomax. I'll put primary care, follow-up with urology if symptoms are not relieved with a 1 week. No need for antibiotics at this time. Return precautions given. Counseled on sedating side effects of Phenergan and opioids and avoiding driving and heavy machinery or other vulnerable circumstances. ____________________________________________   FINAL CLINICAL IMPRESSION(S) / ED DIAGNOSES  Final diagnoses:  Hematuria, unspecified type  Ureterolithiasis       Portions of this note were generated with dragon dictation software. Dictation errors may occur despite best attempts at proofreading.    Sharman CheekPhillip Brevon Dewald,  MD 04/16/16 [phone]

## 2016-04-16 NOTE — ED Triage Notes (Addendum)
Pt states while he sleeping he awoke to left flank pain that radiates down to left groin. Hx of kidney stones. Pt reports vomiting multiple times today  Denies urinary complaints at this time.

## 2016-04-16 NOTE — ED Notes (Signed)
MD at bedside. 

## 2016-05-04 ENCOUNTER — Emergency Department: Payer: Self-pay

## 2016-05-04 ENCOUNTER — Inpatient Hospital Stay
Admission: EM | Admit: 2016-05-04 | Discharge: 2016-05-06 | DRG: 315 | Disposition: A | Discharge status: Home / Self Care | Payer: Self-pay | Attending: Internal Medicine | Admitting: Internal Medicine

## 2016-05-04 ENCOUNTER — Encounter: Payer: Self-pay | Admitting: Emergency Medicine

## 2016-05-04 DIAGNOSIS — N139 Obstructive and reflux uropathy, unspecified: Secondary | ICD-10-CM | POA: Diagnosis present

## 2016-05-04 DIAGNOSIS — E119 Type 2 diabetes mellitus without complications: Secondary | ICD-10-CM

## 2016-05-04 DIAGNOSIS — N132 Hydronephrosis with renal and ureteral calculous obstruction: Secondary | ICD-10-CM | POA: Diagnosis present

## 2016-05-04 DIAGNOSIS — Z791 Long term (current) use of non-steroidal anti-inflammatories (NSAID): Secondary | ICD-10-CM

## 2016-05-04 DIAGNOSIS — F329 Major depressive disorder, single episode, unspecified: Secondary | ICD-10-CM | POA: Diagnosis present

## 2016-05-04 DIAGNOSIS — N179 Acute kidney failure, unspecified: Secondary | ICD-10-CM | POA: Diagnosis present

## 2016-05-04 DIAGNOSIS — R739 Hyperglycemia, unspecified: Secondary | ICD-10-CM

## 2016-05-04 DIAGNOSIS — K5900 Constipation, unspecified: Secondary | ICD-10-CM | POA: Diagnosis present

## 2016-05-04 DIAGNOSIS — R109 Unspecified abdominal pain: Secondary | ICD-10-CM

## 2016-05-04 DIAGNOSIS — N201 Calculus of ureter: Secondary | ICD-10-CM

## 2016-05-04 DIAGNOSIS — E1165 Type 2 diabetes mellitus with hyperglycemia: Secondary | ICD-10-CM | POA: Diagnosis present

## 2016-05-04 DIAGNOSIS — Z794 Long term (current) use of insulin: Secondary | ICD-10-CM

## 2016-05-04 DIAGNOSIS — I10 Essential (primary) hypertension: Secondary | ICD-10-CM | POA: Diagnosis present

## 2016-05-04 DIAGNOSIS — Z79899 Other long term (current) drug therapy: Secondary | ICD-10-CM

## 2016-05-04 DIAGNOSIS — I959 Hypotension, unspecified: Principal | ICD-10-CM | POA: Diagnosis present

## 2016-05-04 DIAGNOSIS — D72829 Elevated white blood cell count, unspecified: Secondary | ICD-10-CM | POA: Diagnosis present

## 2016-05-04 LAB — URINALYSIS COMPLETE WITH MICROSCOPIC (ARMC ONLY)
BACTERIA UA: NONE SEEN
Glucose, UA: >500 mg/dL — AB
LEUKOCYTES UA: NEGATIVE
NITRITE: NEGATIVE
PH: 5 (ref 5.0–8.0)
PROTEIN: 100 mg/dL — AB
SPECIFIC GRAVITY, URINE: 1.026 (ref 1.005–1.030)

## 2016-05-04 LAB — BASIC METABOLIC PANEL
ANION GAP: 10 (ref 5–15)
BUN: 17 mg/dL (ref 6–20)
CHLORIDE: 105 mmol/L (ref 101–111)
CO2: 21 mmol/L — ABNORMAL LOW (ref 22–32)
Calcium: 9.1 mg/dL (ref 8.9–10.3)
Creatinine, Ser: 1.96 mg/dL — ABNORMAL HIGH (ref 0.61–1.24)
GFR calc Af Amer: 46 mL/min — ABNORMAL LOW (ref >60)
GFR, EST NON AFRICAN AMERICAN: 40 mL/min — AB (ref >60)
Glucose, Bld: 325 mg/dL — ABNORMAL HIGH (ref 65–99)
POTASSIUM: 4.7 mmol/L (ref 3.5–5.1)
SODIUM: 136 mmol/L (ref 135–145)

## 2016-05-04 LAB — CBC
HEMATOCRIT: 57 % — AB (ref 40.0–52.0)
Hemoglobin: 19.3 g/dL — ABNORMAL HIGH (ref 13.0–18.0)
MCH: 30.4 pg (ref 26.0–34.0)
MCHC: 33.8 g/dL (ref 32.0–36.0)
MCV: 89.7 fL (ref 80.0–100.0)
Platelets: 273 10*3/uL (ref 150–440)
RBC: 6.35 MIL/uL — ABNORMAL HIGH (ref 4.40–5.90)
RDW: 12.9 % (ref 11.5–14.5)
WBC: 16.2 10*3/uL — ABNORMAL HIGH (ref 3.8–10.6)

## 2016-05-04 LAB — GLUCOSE, CAPILLARY
GLUCOSE-CAPILLARY: 336 mg/dL — AB (ref 65–99)
GLUCOSE-CAPILLARY: 187 mg/dL — AB (ref 65–99)

## 2016-05-04 LAB — TROPONIN I

## 2016-05-04 MED ORDER — ONDANSETRON HCL 4 MG/2ML IJ SOLN: 4.0000 mg | Freq: Four times a day (QID) | INTRAMUSCULAR | Status: DC | PRN

## 2016-05-04 MED ORDER — SODIUM CHLORIDE 0.9 % IV SOLN
INTRAVENOUS | Status: DC
  Administered 2016-05-05 – 2016-05-06 (×5): via INTRAVENOUS

## 2016-05-04 MED ORDER — ACETAMINOPHEN 650 MG RE SUPP: 650.0000 mg | Freq: Four times a day (QID) | RECTAL | Status: DC | PRN

## 2016-05-04 MED ORDER — CEFTRIAXONE SODIUM-DEXTROSE 1-3.74 GM-% IV SOLR
1.0000 g | Freq: Once | INTRAVENOUS | Status: AC
  Administered 2016-05-04: 1 g via INTRAVENOUS
  Filled 2016-05-04: qty 50

## 2016-05-04 MED ORDER — ENOXAPARIN SODIUM 40 MG/0.4ML ~~LOC~~ SOLN
40.0000 mg | SUBCUTANEOUS | Status: DC
  Administered 2016-05-05: 40 mg via SUBCUTANEOUS
  Filled 2016-05-04: qty 0.4

## 2016-05-04 MED ORDER — INSULIN ASPART 100 UNIT/ML ~~LOC~~ SOLN: 6.0000 [IU] | Freq: Once | SUBCUTANEOUS | Status: DC

## 2016-05-04 MED ORDER — INSULIN ASPART 100 UNIT/ML ~~LOC~~ SOLN: 0.0000 [IU] | Freq: Every day | SUBCUTANEOUS | Status: DC

## 2016-05-04 MED ORDER — TAMSULOSIN HCL 0.4 MG PO CAPS
0.4000 mg | ORAL_CAPSULE | Freq: Every day | ORAL | Status: DC
  Administered 2016-05-06: 0.4 mg via ORAL
  Filled 2016-05-04: qty 1

## 2016-05-04 MED ORDER — ACETAMINOPHEN 325 MG PO TABS: 650.0000 mg | ORAL_TABLET | Freq: Four times a day (QID) | ORAL | Status: DC | PRN

## 2016-05-04 MED ORDER — PROMETHAZINE HCL 25 MG/ML IJ SOLN
12.5000 mg | Freq: Once | INTRAMUSCULAR | Status: AC
  Administered 2016-05-04: 12.5 mg via INTRAVENOUS
  Filled 2016-05-04: qty 1

## 2016-05-04 MED ORDER — DEXTROSE 5 % IV SOLN: 1.0000 g | Freq: Once | INTRAVENOUS | Status: DC

## 2016-05-04 MED ORDER — ONDANSETRON HCL 4 MG PO TABS: 4.0000 mg | ORAL_TABLET | Freq: Four times a day (QID) | ORAL | Status: DC | PRN

## 2016-05-04 MED ORDER — INSULIN ASPART 100 UNIT/ML ~~LOC~~ SOLN
0.0000 [IU] | Freq: Three times a day (TID) | SUBCUTANEOUS | Status: DC
  Administered 2016-05-05: 1 [IU] via SUBCUTANEOUS
  Administered 2016-05-05: 2 [IU] via SUBCUTANEOUS
  Administered 2016-05-05: 1 [IU] via SUBCUTANEOUS
  Administered 2016-05-06: 2 [IU] via SUBCUTANEOUS
  Filled 2016-05-04 (×2): qty 2
  Filled 2016-05-04 (×2): qty 1

## 2016-05-04 MED ORDER — MORPHINE SULFATE (PF) 4 MG/ML IV SOLN
4.0000 mg | INTRAVENOUS | Status: DC | PRN
  Administered 2016-05-04 (×2): 4 mg via INTRAVENOUS
  Filled 2016-05-04 (×2): qty 1

## 2016-05-04 MED ORDER — SODIUM CHLORIDE 0.9 % IV BOLUS (SEPSIS)
1000.0000 mL | Freq: Once | INTRAVENOUS | Status: AC
  Administered 2016-05-04: 1000 mL via INTRAVENOUS

## 2016-05-04 MED ORDER — OXYCODONE-ACETAMINOPHEN 5-325 MG PO TABS
1.0000 | ORAL_TABLET | Freq: Four times a day (QID) | ORAL | Status: DC | PRN
  Administered 2016-05-05: 1 via ORAL
  Filled 2016-05-04: qty 1

## 2016-05-04 MED ORDER — SODIUM CHLORIDE 0.9% FLUSH
3.0000 mL | Freq: Two times a day (BID) | INTRAVENOUS | Status: DC
  Administered 2016-05-05: 3 mL via INTRAVENOUS

## 2016-05-04 NOTE — H&P (Signed)
[facility] [place] [facility]   PATIENT NAME: Dustin Williamson    MR#:  [id number]  DATE OF BIRTH:  07-Oct-1970  DATE OF ADMISSION:  05/04/2016  PRIMARY CARE PHYSICIAN: Emogene MorganAYCOCK, NGWE A, MD   REQUESTING/REFERRING PHYSICIAN: Roxan Hockeyobinson, MD  CHIEF COMPLAINT:   Chief Complaint  Patient presents with  . Flank Pain    HISTORY OF PRESENT ILLNESS:  Dustin Williamson  is a 45 y.o. male who presents with Persistent flank pain. Patient was here recently and found to have 6 mm kidney stone.  He was discharged home with expectorants to pass the stone. However, he states that he does not recall having seen a past during that time. He comes back in tonight with persistent flank pain. Here he was found on imaging to have bilateral renal stones, with some hydronephrosis of the left kidney and some suggestions of possible obstruction on imaging. He also describes some dysuria, however his UA shows red blood cells but no other signs of infection. He is also persistently hypotensive in the ED despite some fluid resuscitation. He states that he has not been eating or drinking much these last few days. Hospitalists were called for admission  PAST MEDICAL HISTORY:   Past Medical History:  Diagnosis Date  . Depressed   . Diabetes mellitus without complication (HCC)   . Hypertension     PAST SURGICAL HISTORY:   Past Surgical History:  Procedure Laterality Date  . HERNIA REPAIR      SOCIAL HISTORY:   Social History  Substance Use Topics  . Smoking status: Never Smoker  . Smokeless tobacco: Never Used  . Alcohol use No    FAMILY HISTORY:   Family History  Problem Relation Age of Onset  . Diabetes Mellitus II Father     DRUG ALLERGIES:  No Known Allergies  MEDICATIONS AT HOME:   Prior to Admission medications   Medication Sig Start Date End Date Taking? Authorizing Provider  cloNIDine (CATAPRES) 0.2 MG tablet Take 1 tablet (0.2 mg total) by mouth 3 (three) times  daily. 09/28/15   Auburn BilberryShreyang Patel, MD  dicyclomine (BENTYL) 20 MG tablet Take 1 tablet (20 mg total) by mouth 3 (three) times daily as needed for spasms. Patient not taking: Reported on 09/26/2015 02/15/15 02/15/16  Phineas SemenGraydon Goodman, MD  insulin glargine (LANTUS) 100 UNIT/ML injection Inject 0.24 mLs (24 Units total) into the skin at bedtime. 09/28/15   Auburn BilberryShreyang Patel, MD  metFORMIN (GLUCOPHAGE) 1000 MG tablet Take 1 tablet (1,000 mg total) by mouth 2 (two) times daily with a meal. 10/30/15 10/30/15  Jene Everyobert Kinner, MD  naproxen (NAPROSYN) 500 MG tablet Take 1 tablet (500 mg total) by mouth 2 (two) times daily with a meal. 04/16/16   Sharman CheekPhillip Stafford, MD  oxyCODONE-acetaminophen (ROXICET) 5-325 MG tablet Take 1 tablet by mouth every 6 (six) hours as needed for severe pain. 04/16/16   Sharman CheekPhillip Stafford, MD  promethazine (PHENERGAN) 25 MG tablet Take 1 tablet (25 mg total) by mouth every 6 (six) hours as needed for nausea or vomiting. 04/16/16   Sharman CheekPhillip Stafford, MD  propranolol ER (INDERAL LA) 160 MG SR capsule Take 1 capsule (160 mg total) by mouth daily. 10/30/15   Jene Everyobert Kinner, MD  tamsulosin (FLOMAX) 0.4 MG CAPS capsule Take 1 capsule (0.4 mg total) by mouth daily. 04/16/16   Sharman CheekPhillip Stafford, MD    REVIEW OF SYSTEMS:  Review of Systems  Constitutional: Negative for chills, fever, malaise/fatigue and weight loss.  HENT: Negative  for ear pain, hearing loss and tinnitus.   Eyes: Negative for blurred vision, double vision, pain and redness.  Respiratory: Negative for cough, hemoptysis and shortness of breath.   Cardiovascular: Negative for chest pain, palpitations, orthopnea and leg swelling.  Gastrointestinal: Negative for abdominal pain, constipation, diarrhea, nausea and vomiting.  Genitourinary: Positive for dysuria and flank pain. Negative for frequency and hematuria.  Musculoskeletal: Negative for back pain, joint pain and neck pain.  Skin:       No acne, rash, or lesions  Neurological: Negative  for dizziness, tremors, focal weakness and weakness.  Endo/Heme/Allergies: Negative for polydipsia. Does not bruise/bleed easily.  Psychiatric/Behavioral: Negative for depression. The patient is not nervous/anxious and does not have insomnia.      VITAL SIGNS:   Vitals:   05/04/16 2200 05/04/16 2215 05/04/16 2230 05/04/16 2245  BP: (!) 79/60 (!) 74/59 (!) 75/54 (!) 75/60  Pulse: (!) 58 (!) 56 (!) 55 (!) 53  Resp:      Temp:      TempSrc:      SpO2: 93% 91% 92% 93%  Weight:      Height:       Wt Readings from Last 3 Encounters:  05/04/16 96.6 kg (213 lb)  04/16/16 104.3 kg (230 lb)  10/30/15 106.1 kg (234 lb)    PHYSICAL EXAMINATION:  Physical Exam  Vitals reviewed. Constitutional: He is oriented to person, place, and time. He appears well-developed and well-nourished. No distress.  HENT:  Head: Normocephalic and atraumatic.  Dry mucous membranes  Eyes: Conjunctivae and EOM are normal. Pupils are equal, round, and reactive to light. No scleral icterus.  Neck: Normal range of motion. Neck supple. No JVD present. No thyromegaly present.  Cardiovascular: Normal rate, regular rhythm and intact distal pulses.  Exam reveals no gallop and no friction rub.   No murmur heard. Respiratory: Effort normal and breath sounds normal. No respiratory distress. He has no wheezes. He has no rales.  GI: Soft. Bowel sounds are normal. He exhibits no distension. There is no tenderness.  Musculoskeletal: Normal range of motion. He exhibits tenderness (left flank). He exhibits no edema.  No arthritis, no gout  Lymphadenopathy:    He has no cervical adenopathy.  Neurological: He is alert and oriented to person, place, and time. No cranial nerve deficit.  No dysarthria, no aphasia  Skin: Skin is warm and dry. No rash noted. No erythema.  Psychiatric: He has a normal mood and affect. His behavior is normal. Judgment and thought content normal.    LABORATORY PANEL:   CBC  Recent Labs Lab  05/04/16 1643  WBC 16.2*  HGB 19.3*  HCT 57.0*  PLT 273   ------------------------------------------------------------------------------------------------------------------  Chemistries   Recent Labs Lab 05/04/16 1643  NA 136  K 4.7  CL 105  CO2 21*  GLUCOSE 325*  BUN 17  CREATININE 1.96*  CALCIUM 9.1   ------------------------------------------------------------------------------------------------------------------  Cardiac Enzymes  Recent Labs Lab 05/04/16 1643  TROPONINI <0.03   ------------------------------------------------------------------------------------------------------------------  RADIOLOGY:  [place] Renal  Result Date: 05/04/2016 CLINICAL DATA:  Subacute onset of left flank pain. Initial encounter. EXAM: RENAL / URINARY TRACT ULTRASOUND COMPLETE COMPARISON:  CT of the abdomen and pelvis from 04/16/2016 FINDINGS: Right Kidney: Length: 11.3 cm. Echogenicity within normal limits. A nonobstructing 6 mm stone is noted at the interpole region of the right kidney. No mass or hydronephrosis visualized. Left Kidney: Length: 11.7 cm. Echogenicity within normal limits. A nonobstructing 7 mm stone is noted at the  interpole region of the left kidney. Minimal left-sided hydronephrosis is suggested. No mass visualized. Bladder: Decompressed and not well characterized. The right ureteral jet is visualized. The left ureteral jet is not well seen. IMPRESSION: 1. Minimal left-sided hydronephrosis suggested. This could reflect mild underlying obstruction, as the left ureteral jet is not visualized. 2. Nonobstructing bilateral renal stones seen, measuring up to 7 mm in size. Electronically Signed   By: Roanna Raider M.D.   On: 05/04/2016 18:34    EKG:   Orders placed or performed during the hospital encounter of 05/04/16  . ED EKG within 10 minutes  . ED EKG within 10 minutes  . EKG 12-Lead  . EKG 12-Lead    IMPRESSION AND PLAN:  Principal Problem:   Hypotension - 2 L IV  fluids given in the ED without much improvement in his blood pressure. His MAP is okay, and he is asymptomatic in terms of his hypotension. We will continue IV fluids and monitor closely. Active Problems:   AKI (acute kidney injury) (HCC) - likely due to his hypotension in conjunction with his urinary tract pathology   Urinary obstruction - urology consult, unclear at this time if this is perhaps due to a stone blocking his ureter versus some other pathology.   Diabetes (HCC) - sliding scale insulin with corresponding glucose checks  All the records are reviewed and case discussed with ED provider. Management plans discussed with the patient and/or family.  DVT PROPHYLAXIS: SubQ lovenox  GI PROPHYLAXIS: None  ADMISSION STATUS: Inpatient  CODE STATUS: Full Code Status History    Date Active Date Inactive Code Status Order ID Comments User Context   09/26/2015 10:31 AM 09/28/2015  9:48 PM Full Code [id number]  Arnaldo Natal, MD Inpatient      TOTAL TIME TAKING CARE OF THIS PATIENT: 45 minutes.    Klayten Jolliff FIELDING 05/04/2016, 11:01 PM  [facility]  Office  [phone]  CC: Primary care physician; Emogene Morgan, MD

## 2016-05-04 NOTE — ED Triage Notes (Signed)
Patient presents to the ED with severe left flank pain radiating into his groin area.  Patient also reports that he donated plasma today.  Patient denies feeling light headed.  Patient reports urinary frequency but being able to urinate only small amounts.  Patient appears uncomfortable during triage.

## 2016-05-04 NOTE — ED Provider Notes (Signed)
[facility] Emergency Department Provider Note    First MD Initiated Contact with Patient 05/04/16 1714     (approximate)  I have reviewed the triage vital signs and the nursing notes.   HISTORY  Chief Complaint Flank Pain    HPI Dustin CousinJeffrey D Champa is a 45 y.o. male  with recent diagnosis of a 6 mm left-sided ureterolithiasis presents with worsening flank pain and dysuria. Patient denies any fevers. States the pain has been unrelenting and not improved. He has not followed up with urology. States that the dysuria is new. Denies any nausea or vomiting. Denies any diarrhea or constipation. Denies any mid abdominal pain.   Past Medical History:  Diagnosis Date  . Depressed   . Diabetes mellitus without complication (HCC)   . Hypertension    Family History  Problem Relation Age of Onset  . Diabetes Mellitus II Father    Past Surgical History:  Procedure Laterality Date  . HERNIA REPAIR     Patient Active Problem List   Diagnosis Date Noted  . Hyperglycemia 09/26/2015  . CLOSED FRACTURE OF BASE OF OTHER METACARPAL BONE 03/09/2009      Prior to Admission medications   Medication Sig Start Date End Date Taking? Authorizing Provider  cloNIDine (CATAPRES) 0.2 MG tablet Take 1 tablet (0.2 mg total) by mouth 3 (three) times daily. 09/28/15   Auburn BilberryShreyang Patel, MD  dicyclomine (BENTYL) 20 MG tablet Take 1 tablet (20 mg total) by mouth 3 (three) times daily as needed for spasms. Patient not taking: Reported on 09/26/2015 02/15/15 02/15/16  Phineas SemenGraydon Goodman, MD  insulin glargine (LANTUS) 100 UNIT/ML injection Inject 0.24 mLs (24 Units total) into the skin at bedtime. 09/28/15   Auburn BilberryShreyang Patel, MD  metFORMIN (GLUCOPHAGE) 1000 MG tablet Take 1 tablet (1,000 mg total) by mouth 2 (two) times daily with a meal. 10/30/15 10/30/15  Jene Everyobert Kinner, MD  naproxen (NAPROSYN) 500 MG tablet Take 1 tablet (500 mg total) by mouth 2 (two) times daily with a meal. 04/16/16   Sharman CheekPhillip  Stafford, MD  oxyCODONE-acetaminophen (ROXICET) 5-325 MG tablet Take 1 tablet by mouth every 6 (six) hours as needed for severe pain. 04/16/16   Sharman CheekPhillip Stafford, MD  promethazine (PHENERGAN) 25 MG tablet Take 1 tablet (25 mg total) by mouth every 6 (six) hours as needed for nausea or vomiting. 04/16/16   Sharman CheekPhillip Stafford, MD  propranolol ER (INDERAL LA) 160 MG SR capsule Take 1 capsule (160 mg total) by mouth daily. 10/30/15   Jene Everyobert Kinner, MD  tamsulosin (FLOMAX) 0.4 MG CAPS capsule Take 1 capsule (0.4 mg total) by mouth daily. 04/16/16   Sharman CheekPhillip Stafford, MD    Allergies Patient has no known allergies.    Social History Social History  Substance Use Topics  . Smoking status: Never Smoker  . Smokeless tobacco: Never Used  . Alcohol use No    Review of Systems Patient denies headaches, rhinorrhea, blurry vision, numbness, shortness of breath, chest pain, edema, cough, abdominal pain, nausea, vomiting, diarrhea, dysuria, fevers, rashes or hallucinations unless otherwise stated above in HPI. ____________________________________________   PHYSICAL EXAM:  VITAL SIGNS: Vitals:   05/04/16 1636  BP: (!) 81/60  Pulse: 88  Resp: 20  Temp: 97.6 F (36.4 C)    Constitutional: Alert and oriented. Well appearing and in no acute distress. Eyes: Conjunctivae are normal. PERRL. EOMI. Head: Atraumatic. Nose: No congestion/rhinnorhea. Mouth/Throat: Mucous membranes are moist.  Oropharynx non-erythematous. Neck: No stridor. Painless ROM. No cervical spine tenderness  to palpation Hematological/Lymphatic/Immunilogical: No cervical lymphadenopathy. Cardiovascular: Normal rate, regular rhythm. Grossly normal heart sounds.  Good peripheral circulation. Respiratory: Normal respiratory effort.  No retractions. Lungs CTAB. Gastrointestinal: Soft and nontender. No distention. No abdominal bruits. No CVA tenderness. Musculoskeletal: No lower extremity tenderness nor edema.  No joint  effusions. Neurologic:  Normal speech and language. No gross focal neurologic deficits are appreciated. No gait instability. Skin:  Skin is warm, dry and intact. No rash noted. Psychiatric: Mood and affect are normal. Speech and behavior are normal.  ____________________________________________   LABS (all labs ordered are listed, but only abnormal results are displayed)  Results for orders placed or performed during the hospital encounter of 05/04/16 (from the past 24 hour(s))  CBC     Status: Abnormal   Collection Time: 05/04/16  4:43 PM  Result Value Ref Range   WBC 16.2 (H) 3.8 - 10.6 K/uL   RBC 6.35 (H) 4.40 - 5.90 MIL/uL   Hemoglobin 19.3 (H) 13.0 - 18.0 g/dL   HCT 16.157.0 (H) 09.640.0 - 04.552.0 %   MCV 89.7 80.0 - 100.0 fL   MCH 30.4 26.0 - 34.0 pg   MCHC 33.8 32.0 - 36.0 g/dL   RDW 40.912.9 81.111.5 - 91.414.5 %   Platelets 273 150 - 440 K/uL  Glucose, capillary     Status: Abnormal   Collection Time: 05/04/16  4:50 PM  Result Value Ref Range   Glucose-Capillary 336 (H) 65 - 99 mg/dL   ____________________________________________   ____________________________________________  RADIOLOGY  I personally reviewed all radiographic images ordered to evaluate for the above acute complaints and reviewed radiology reports and findings.  These findings were personally discussed with the patient.  Please see medical record for radiology report.  ____________________________________________   PROCEDURES  Procedure(s) performed: none Procedures    Critical Care performed: yes CRITICAL CARE Performed by: Willy EddyPatrick Danyetta Gillham   Total critical care time: 45 minutes  Critical care time was exclusive of separately billable procedures and treating other patients.  Critical care was necessary to treat or prevent imminent or life-threatening deterioration.  Critical care was time spent personally by me on the following activities: development of treatment plan with patient and/or surrogate as well  as nursing, discussions with consultants, evaluation of patient's response to treatment, examination of patient, obtaining history from patient or surrogate, ordering and performing treatments and interventions, ordering and review of laboratory studies, ordering and review of radiographic studies, pulse oximetry and re-evaluation of patient's condition.  ____________________________________________   INITIAL IMPRESSION / ASSESSMENT AND PLAN / ED COURSE  Pertinent labs & imaging results that were available during my care of the patient were reviewed by me and considered in my medical decision making (see chart for details).  DDX: Pyelonephritis, msk strain, kidney stone, colitis, radiculopathy, shingles   Dustin CousinJeffrey D Williamson is a [age] y.o. who presents to the ED with Hx of kidney stones and diabetes p/w recurrent left flank pain. No fevers, no systemic symptoms. +urinary symptoms. Denies trauma or injury. Afebrile in ED. Exam as above. Flank TTP, otherwise abdominal exam is benign. No peritoneal signs. Probable kidney stone igven recent dx of same.  possible cystitis, or pyelonephritis.   Checking urine. UA with no evidence of hematuria - will send for culture US with with mild hydronephrosis  Clinical Course as of May 04 2116  Thu May 04, 2016  2105 Patient reassessed. Still hypotensive. Receiving additional fluid boluses.  He has trace ketones.   No significant metabolic abnormality to be  consistent with DKA that he has been noncompliant with his insulin.  We'll give dose of IV Rocephin additional IV fluids for resuscitation as well as IV insulin. Patient will be admitted hospital for further evaluation and management.  Have discussed with the patient and available family all diagnostics and treatments performed thus far and all questions were answered to the best of my ability. The patient demonstrates understanding and agreement with plan.   [PR]    Clinical Course User Index [PR] Willy Eddy, MD     ____________________________________________   FINAL CLINICAL IMPRESSION(S) / ED DIAGNOSES  Final diagnoses:  Hypotension, unspecified hypotension type  Ureterolithiasis  Acute left flank pain  Leukocytosis, unspecified type  Hyperglycemia      NEW MEDICATIONS STARTED DURING THIS VISIT:  New Prescriptions   No medications on file     Note:  This document was prepared using Dragon voice recognition software and may include unintentional dictation errors.    Willy Eddy, MD 05/04/16 2118

## 2016-05-04 NOTE — ED Notes (Signed)
Pt to US at this time.

## 2016-05-04 NOTE — ED Notes (Signed)
BS 187

## 2016-05-04 NOTE — ED Notes (Signed)
Pt was called for triage and no answer.

## 2016-05-05 ENCOUNTER — Inpatient Hospital Stay: Payer: Self-pay | Admitting: Anesthesiology

## 2016-05-05 ENCOUNTER — Encounter: Payer: Self-pay | Admitting: Anesthesiology

## 2016-05-05 ENCOUNTER — Encounter
Admission: EM | Disposition: A | Discharge status: Home / Self Care | Payer: Self-pay | Source: Home / Self Care | Attending: Internal Medicine

## 2016-05-05 DIAGNOSIS — N201 Calculus of ureter: Secondary | ICD-10-CM

## 2016-05-05 DIAGNOSIS — I959 Hypotension, unspecified: Principal | ICD-10-CM

## 2016-05-05 DIAGNOSIS — N179 Acute kidney failure, unspecified: Secondary | ICD-10-CM

## 2016-05-05 DIAGNOSIS — R109 Unspecified abdominal pain: Secondary | ICD-10-CM

## 2016-05-05 HISTORY — PX: CYSTOSCOPY WITH STENT PLACEMENT: SHX5790

## 2016-05-05 LAB — GLUCOSE, CAPILLARY
GLUCOSE-CAPILLARY: 127 mg/dL — AB (ref 65–99)
GLUCOSE-CAPILLARY: 121 mg/dL — AB (ref 65–99)
GLUCOSE-CAPILLARY: 125 mg/dL — AB (ref 65–99)
GLUCOSE-CAPILLARY: 128 mg/dL — AB (ref 65–99)
GLUCOSE-CAPILLARY: 185 mg/dL — AB (ref 65–99)
GLUCOSE-CAPILLARY: 122 mg/dL — AB (ref 65–99)
Glucose-Capillary: 129 mg/dL — ABNORMAL HIGH (ref 65–99)

## 2016-05-05 LAB — BASIC METABOLIC PANEL
Anion gap: 4 — ABNORMAL LOW (ref 5–15)
BUN: 18 mg/dL (ref 6–20)
CALCIUM: 7.8 mg/dL — AB (ref 8.9–10.3)
CO2: 26 mmol/L (ref 22–32)
CREATININE: 1.97 mg/dL — AB (ref 0.61–1.24)
Chloride: 111 mmol/L (ref 101–111)
GFR, EST AFRICAN AMERICAN: 46 mL/min — AB (ref >60)
GFR, EST NON AFRICAN AMERICAN: 39 mL/min — AB (ref >60)
Glucose, Bld: 137 mg/dL — ABNORMAL HIGH (ref 65–99)
Potassium: 4.2 mmol/L (ref 3.5–5.1)
SODIUM: 141 mmol/L (ref 135–145)

## 2016-05-05 LAB — CBC
HCT: 45.7 % (ref 40.0–52.0)
Hemoglobin: 15.3 g/dL (ref 13.0–18.0)
MCH: 30.7 pg (ref 26.0–34.0)
MCHC: 33.6 g/dL (ref 32.0–36.0)
MCV: 91.5 fL (ref 80.0–100.0)
PLATELETS: 168 10*3/uL (ref 150–440)
RBC: 4.99 MIL/uL (ref 4.40–5.90)
RDW: 13 % (ref 11.5–14.5)
WBC: 10.5 10*3/uL (ref 3.8–10.6)

## 2016-05-05 LAB — MRSA PCR SCREENING: MRSA BY PCR: NEGATIVE

## 2016-05-05 SURGERY — CYSTOSCOPY, WITH STENT INSERTION
Anesthesia: General | Site: Ureter | Laterality: Left | Wound class: Clean Contaminated

## 2016-05-05 MED ORDER — GLYCOPYRROLATE 0.2 MG/ML IJ SOLN
INTRAMUSCULAR | Status: DC | PRN
  Administered 2016-05-05: 0.2 mg via INTRAVENOUS
  Administered 2016-05-05: 50 mg via INTRAVENOUS

## 2016-05-05 MED ORDER — EPHEDRINE SULFATE 50 MG/ML IJ SOLN
INTRAMUSCULAR | Status: DC | PRN
  Administered 2016-05-05: 10 mg via INTRAVENOUS

## 2016-05-05 MED ORDER — FENTANYL CITRATE (PF) 100 MCG/2ML IJ SOLN
INTRAMUSCULAR | Status: DC | PRN
  Administered 2016-05-05: 25 ug via INTRAVENOUS

## 2016-05-05 MED ORDER — CHLORHEXIDINE GLUCONATE 0.12 % MT SOLN
15.0000 mL | Freq: Two times a day (BID) | OROMUCOSAL | Status: DC
Start: 2016-05-05 — End: 2016-05-06
  Administered 2016-05-06: 15 mL via OROMUCOSAL
  Filled 2016-05-05 (×3): qty 15

## 2016-05-05 MED ORDER — BISACODYL 5 MG PO TBEC: 10.0000 mg | DELAYED_RELEASE_TABLET | Freq: Every day | ORAL | Status: DC | PRN

## 2016-05-05 MED ORDER — IOTHALAMATE MEGLUMINE 43 % IV SOLN
INTRAVENOUS | Status: DC | PRN
  Administered 2016-05-05: 15 mL

## 2016-05-05 MED ORDER — MORPHINE SULFATE (PF) 4 MG/ML IV SOLN
4.0000 mg | INTRAVENOUS | Status: DC | PRN
  Administered 2016-05-05 – 2016-05-06 (×4): 4 mg via INTRAVENOUS
  Filled 2016-05-05 (×4): qty 1

## 2016-05-05 MED ORDER — MIDAZOLAM HCL 2 MG/2ML IJ SOLN
INTRAMUSCULAR | Status: DC | PRN
  Administered 2016-05-05: 2 mg via INTRAVENOUS

## 2016-05-05 MED ORDER — OXYCODONE-ACETAMINOPHEN 5-325 MG PO TABS
1.0000 | ORAL_TABLET | Freq: Four times a day (QID) | ORAL | Status: DC | PRN
  Administered 2016-05-05 – 2016-05-06 (×2): 1 via ORAL
  Filled 2016-05-05 (×2): qty 1

## 2016-05-05 MED ORDER — PROMETHAZINE HCL 25 MG/ML IJ SOLN
6.2500 mg | INTRAMUSCULAR | Status: DC | PRN
Start: 2016-05-05 — End: 2016-05-05

## 2016-05-05 MED ORDER — PHENYLEPHRINE HCL 10 MG/ML IJ SOLN
INTRAMUSCULAR | Status: DC | PRN
  Administered 2016-05-05 (×3): 100 ug via INTRAVENOUS

## 2016-05-05 MED ORDER — PROPOFOL 10 MG/ML IV BOLUS
INTRAVENOUS | Status: DC | PRN
  Administered 2016-05-05: 150 mg via INTRAVENOUS

## 2016-05-05 MED ORDER — ORAL CARE MOUTH RINSE: 15.0000 mL | Freq: Two times a day (BID) | OROMUCOSAL | Status: DC

## 2016-05-05 MED ORDER — SODIUM CHLORIDE 0.9 % IV BOLUS (SEPSIS)
500.0000 mL | Freq: Once | INTRAVENOUS | Status: AC
  Administered 2016-05-05: 500 mL via INTRAVENOUS

## 2016-05-05 MED ORDER — FENTANYL CITRATE (PF) 100 MCG/2ML IJ SOLN: 25.0000 ug | INTRAMUSCULAR | Status: DC | PRN

## 2016-05-05 MED ORDER — SODIUM CHLORIDE 0.9 % IV SOLN: INTRAVENOUS | Status: DC | PRN

## 2016-05-05 MED ORDER — POLYETHYLENE GLYCOL 3350 17 G PO PACK
17.0000 g | PACK | Freq: Every day | ORAL | Status: DC
  Administered 2016-05-05 – 2016-05-06 (×2): 17 g via ORAL
  Filled 2016-05-05 (×2): qty 1

## 2016-05-05 MED ORDER — PAROXETINE HCL 20 MG PO TABS
30.0000 mg | ORAL_TABLET | Freq: Every day | ORAL | Status: DC
  Administered 2016-05-05 – 2016-05-06 (×2): 30 mg via ORAL
  Filled 2016-05-05 (×2): qty 2
  Filled 2016-05-05: qty 1

## 2016-05-05 SURGICAL SUPPLY — 21 items
BAG DRAIN CYSTO-URO LG1000N (MISCELLANEOUS) ×3 IMPLANT
CATH FOL 2WAY LX 16X5 (CATHETERS) IMPLANT
CATH URETL 5X70 OPEN END (CATHETERS) ×3 IMPLANT
CONRAY 43 FOR UROLOGY 50M (MISCELLANEOUS) ×3 IMPLANT
GLOVE BIO SURGEON STRL SZ 6.5 (GLOVE) ×2 IMPLANT
GLOVE BIO SURGEONS STRL SZ 6.5 (GLOVE) ×1
GOWN STRL REUS W/ TWL LRG LVL4 (GOWN DISPOSABLE) ×2 IMPLANT
GOWN STRL REUS W/TWL LRG LVL4 (GOWN DISPOSABLE) ×4
HOLDER FOLEY CATH W/STRAP (MISCELLANEOUS) IMPLANT
KIT RM TURNOVER CYSTO AR (KITS) ×3 IMPLANT
PACK CYSTO AR (MISCELLANEOUS) ×3 IMPLANT
SENSORWIRE 0.038 NOT ANGLED (WIRE) ×3
SET CYSTO W/LG BORE CLAMP LF (SET/KITS/TRAYS/PACK) ×3 IMPLANT
SOL .9 NS 3000ML IRR  AL (IV SOLUTION) ×2
SOL .9 NS 3000ML IRR UROMATIC (IV SOLUTION) ×1 IMPLANT
STENT URET 6FRX24 CONTOUR (STENTS) ×3 IMPLANT
STENT URET 6FRX26 CONTOUR (STENTS) IMPLANT
SURGILUBE 2OZ TUBE FLIPTOP (MISCELLANEOUS) ×3 IMPLANT
SYRINGE IRR TOOMEY STRL 70CC (SYRINGE) ×3 IMPLANT
WATER STERILE IRR 1000ML POUR (IV SOLUTION) ×3 IMPLANT
WIRE SENSOR 0.038 NOT ANGLED (WIRE) ×1 IMPLANT

## 2016-05-05 NOTE — Anesthesia Preprocedure Evaluation (Signed)
Anesthesia Evaluation  Patient identified by MRN, date of birth, ID band Patient awake    Reviewed: Allergy & Precautions, H&P , NPO status , Patient's Chart, lab work & pertinent test results, reviewed documented beta blocker date and time   History of Anesthesia Complications Negative for: history of anesthetic complications  Airway Mallampati: IV  TM Distance: >3 FB Neck ROM: full    Dental  (+) Missing, Poor Dentition   Pulmonary neg pulmonary ROS,           Cardiovascular Exercise Tolerance: Good hypertension, (-) angina(-) CAD, (-) Past MI, (-) Cardiac Stents and (-) CABG (-) dysrhythmias (-) Valvular Problems/Murmurs     Neuro/Psych PSYCHIATRIC DISORDERS (Depressed) negative neurological ROS     GI/Hepatic negative GI ROS, Neg liver ROS,   Endo/Other  diabetes  Renal/GU ARFRenal disease (kidney stones)  negative genitourinary   Musculoskeletal   Abdominal   Peds  Hematology negative hematology ROS (+)   Anesthesia Other Findings Past Medical History: No date: Depressed No date: Diabetes mellitus without complication (HCC) No date: Hypertension   Reproductive/Obstetrics negative OB ROS                             Anesthesia Physical Anesthesia Plan  ASA: II  Anesthesia Plan: General   Post-op Pain Management:    Induction:   Airway Management Planned:   Additional Equipment:   Intra-op Plan:   Post-operative Plan:   Informed Consent: I have reviewed the patients History and Physical, chart, labs and discussed the procedure including the risks, benefits and alternatives for the proposed anesthesia with the patient or authorized representative who has indicated his/her understanding and acceptance.   Dental Advisory Given  Plan Discussed with: Anesthesiologist, CRNA and Surgeon  Anesthesia Plan Comments:         Anesthesia Quick Evaluation

## 2016-05-05 NOTE — Op Note (Signed)
Date of procedure: 05/05/16  Preoperative diagnosis:  1. Left ureteral stone 2. Acute renal failure 3. hypotension  Postoperative diagnosis:  1. same   Procedure: 1. Cystoscopy 2. Left retrograde pyelogram 3. Left ureteral stent placement 4. Foley catheter placement  Surgeon: Vanna ScotlandAshley Leverne Amrhein, MD  Anesthesia: General  Complications: None  Intraoperative findings: Left retrograde with complete distal ureteral obstruction, and no contrast reflux above the level of stone. Hydronephrotic drip from left kidney appreciated.   EBL: minimal  Specimens: left renal urine culture  Drains: 6 x 24 French double-J ureteral stent on left, 16 French Foley catheter  Indication: Charletta CousinJeffrey D Babe is a 45 y.o. patient with signs and symptoms concerning for sepsis and left ureteral obstruction.  Please see consult note for details.  After reviewing the management options for treatment, he elected to proceed with the above surgical procedure(s). We have discussed the potential benefits and risks of the procedure, side effects of the proposed treatment, the likelihood of the patient achieving the goals of the procedure, and any potential problems that might occur during the procedure or recuperation. Informed consent has been obtained.  Description of procedure:  The patient was taken to the operating room and general anesthesia was induced.  The patient was placed in the dorsal lithotomy position, prepped and draped in the usual sterile fashion.  Patient already on abx. A preoperative time-out was performed.   A 21 French scope was advanced per urethra into the bladder. Of note, there is some dependent blood in the base of the bladder but no significant cystitis. Attention was turned to the left ureteral orifice which was cannulated using a 5 [place]French open-ended ureteral catheter just within the UO. A gentle retrograde pyelogram was performed which showed a flexible of contrast material several centimeters  up the ureter but no contrast above this level concerning for complete obstruction. The wire was then placed up to level of the kidney without difficulty under fluoroscopic guidance. Upon wire placement, there was a copious amount of blood tinged urine. A 5 French open-ended ureteral catheter was advanced all the way up to level of the renal pelvis without difficulty. A brisk hydronephrotic drip was appreciated. Approximately 20 cc of urine was then aspirated and sent off for urine culture from the left renal pelvis. The wire was then replaced and the open-ended ureteral catheter was removed. A 6 x 24 French double-J ureteral stent was advanced up to level of the kidney. The wire was partially drawn so full coil was noted within the renal pelvis. The wire was then fully withdrawn and a full coil was noted within the bladder. The bladder was then drained. A 16 French Foley catheter was placed. Of note, upon stent placement, there was copious reflux from the left kidney through the stent appreciated. Left was filled with 10 cc of sterile water. The patient was cleaned and dried, repositioned the supine position, reversed from anesthesia, taken to the PACU.  He was hypotensive throughout the case and treated with phenylephrine.  Vanna ScotlandAshley Lashandra Arauz, M.D.

## 2016-05-05 NOTE — Transfer of Care (Signed)
Immediate Anesthesia Transfer of Care Note  Patient: Dustin Williamson  Procedure(s) Performed: Procedure(s): CYSTOSCOPY WITH STENT PLACEMENT (Left)  Patient Location: PACU  Anesthesia Type:General  Level of Consciousness: sedated  Airway & Oxygen Therapy: Patient Spontanous Breathing and Patient connected to face mask oxygen  Post-op Assessment: Report given to RN and Post -op Vital signs reviewed and stable  Post vital signs: Reviewed and stable  Last Vitals:  Vitals:   05/05/16 1019 05/05/16 1020  BP: 103/73 103/73  Pulse: 91 91  Resp: 14 14  Temp: 36.3 C 36.3 C    Complications: No apparent anesthesia complications

## 2016-05-05 NOTE — Progress Notes (Addendum)
Inpatient Diabetes Program Recommendations  AACE/ADA: New Consensus Statement on Inpatient Glycemic Control (2015)  Target Ranges:  Prepandial:   less than 140 mg/dL      Peak postprandial:   less than 180 mg/dL (1-2 hours)      Critically ill patients:  140 - 180 mg/dL   Results for Dustin Williamson, Dustin Williamson (MRN [id number]) as of 05/05/2016 08:27  Ref. Range 05/04/2016 16:50 05/04/2016 21:12 05/05/2016 00:00 05/05/2016 04:32 05/05/2016 07:14  Glucose-Capillary Latest Ref Range: 65 - 99 mg/dL 409336 (H) 811187 (H) 914129 (H) 127 (H) 121 (H)  Results for Dustin Williamson, Dustin Williamson (MRN [id number]) as of 05/05/2016 12:54  Ref. Range 05/05/2016 10:24 05/05/2016 11:50  Glucose-Capillary Latest Ref Range: 65 - 99 mg/dL 086125 (H) 578128 (H)   Results for Dustin Williamson, Dustin Williamson (MRN [id number]) as of 05/05/2016 08:27  Ref. Range 09/26/2015 01:23  Hemoglobin A1C Latest Ref Range: 4.0 - 6.0 % 16.0 (H)   Review of Glycemic Control  Diabetes history:              DM, elevated creatinine, AKI, overweight, positive for opiates (4/17), cannabinoid  (10/16), and cocaine (10/16) in past  Outpatient Diabetes medications:   Lantus 80 units QHS, Metformin 1000 mg BID  Current orders for Inpatient glycemic control:   Novolog sensitive correction (0-9 units TIDAC and 0-5 units QHS), carb modified diet  Note: Spoke with patient in room, stopped Lantus 2 to 3 weeks ago.  However, patient continued to take CBG's.  Patient states CBG's in 300's when not taking Lantus.  Inpatient Diabetes Program Recommendations:  Per ADA recommendations "consider performing an A1C on all patients with diabetes or hyperglycemia admitted to the hospital if not performed in the prior 3 months".  Last A1C was 16.0, which indicates CBG's averaging 413 mg/dL.   Thank you,  Kristine LineaKaren Jewelene Mairena, RN, BSN Diabetes Coordinator Inpatient Diabetes Program [phone] (Team Pager)

## 2016-05-05 NOTE — Progress Notes (Signed)
Patient ID: Dustin Williamson, male   DOB: July 21, 1970, 45 y.o.   MRN: [id number]  [facility] PROGRESS NOTE  Dustin CousinJeffrey D Williamson [id number] DOB: July 21, 1970 DOA: 05/04/2016 PCP: Emogene MorganAYCOCK, NGWE A, MD  HPI/Subjective: Patient still feeling abdominal pain. Having constipation. Patient had a ureteral stent placed today by urology.  Objective: Vitals:   05/05/16 1500 05/05/16 1607  BP: 101/78 99/74  Pulse: 73 97  Resp: 15   Temp:  97.9 F (36.6 C)    Filed Weights   05/04/16 1641 05/05/16 0000  Weight: 96.6 kg (213 lb) 100.1 kg (220 lb 10.9 oz)    ROS: Review of Systems  Constitutional: Negative for fever.  Eyes: Negative for blurred vision.  Respiratory: Negative for cough and shortness of breath.   Cardiovascular: Negative for chest pain.  Gastrointestinal: Positive for abdominal pain. Negative for constipation, diarrhea, nausea and vomiting.  Genitourinary: Positive for frequency. Negative for dysuria.  Musculoskeletal: Negative for joint pain.  Neurological: Negative for dizziness and headaches.   Exam: Physical Exam  Constitutional: He is oriented to person, place, and time.  HENT:  Nose: No mucosal edema.  Mouth/Throat: No oropharyngeal exudate or posterior oropharyngeal edema.  Eyes: Conjunctivae, EOM and lids are normal. Pupils are equal, round, and reactive to light.  Neck: No JVD present. Carotid bruit is not present. No edema present. No thyroid mass and no thyromegaly present.  Cardiovascular: S1 normal and S2 normal.  Exam reveals no gallop.   No murmur heard. Pulses:      Dorsalis pedis pulses are 2+ on the right side, and 2+ on the left side.  Respiratory: No respiratory distress. He has no wheezes. He has no rhonchi. He has no rales.  GI: Soft. Bowel sounds are normal. There is tenderness in the suprapubic area.  Musculoskeletal:       Right ankle: He exhibits no swelling.       Left ankle: He exhibits no swelling.  Lymphadenopathy:    He has no cervical  adenopathy.  Neurological: He is alert and oriented to person, place, and time. No cranial nerve deficit.  Skin: Skin is warm. No rash noted. Nails show no clubbing.  Psychiatric: He has a normal mood and affect.      Data Reviewed: Basic Metabolic Panel:  Recent Labs Lab 05/04/16 1643 05/05/16 0408  NA 136 141  K 4.7 4.2  CL 105 111  CO2 21* 26  GLUCOSE 325* 137*  BUN 17 18  CREATININE 1.96* 1.97*  CALCIUM 9.1 7.8*   CBC:  Recent Labs Lab 05/04/16 1643 05/05/16 0408  WBC 16.2* 10.5  HGB 19.3* 15.3  HCT 57.0* 45.7  MCV 89.7 91.5  PLT 273 168   Cardiac Enzymes:  Recent Labs Lab 05/04/16 1643  TROPONINI <0.03    CBG:  Recent Labs Lab 05/05/16 0000 05/05/16 0432 05/05/16 0714 05/05/16 1024 05/05/16 1150  GLUCAP 129* 127* 121* 125* 128*    Recent Results (from the past 240 hour(s))  MRSA PCR Screening     Status: None   Collection Time: 05/05/16 12:00 AM  Result Value Ref Range Status   MRSA by PCR NEGATIVE NEGATIVE Final    Comment:        The GeneXpert MRSA Assay (FDA approved for NASAL specimens only), is one component of a comprehensive MRSA colonization surveillance program. It is not intended to diagnose MRSA infection nor to guide or monitor treatment for MRSA infections.      Studies: [place] Renal  Result  Date: 05/04/2016 CLINICAL DATA:  Subacute onset of left flank pain. Initial encounter. EXAM: RENAL / URINARY TRACT ULTRASOUND COMPLETE COMPARISON:  CT of the abdomen and pelvis from 04/16/2016 FINDINGS: Right Kidney: Length: 11.3 cm. Echogenicity within normal limits. A nonobstructing 6 mm stone is noted at the interpole region of the right kidney. No mass or hydronephrosis visualized. Left Kidney: Length: 11.7 cm. Echogenicity within normal limits. A nonobstructing 7 mm stone is noted at the interpole region of the left kidney. Minimal left-sided hydronephrosis is suggested. No mass visualized. Bladder: Decompressed and not well  characterized. The right ureteral jet is visualized. The left ureteral jet is not well seen. IMPRESSION: 1. Minimal left-sided hydronephrosis suggested. This could reflect mild underlying obstruction, as the left ureteral jet is not visualized. 2. Nonobstructing bilateral renal stones seen, measuring up to 7 mm in size. Electronically Signed   By: Roanna RaiderJeffery  Chang M.D.   On: 05/04/2016 18:34    Scheduled Meds: . chlorhexidine  15 mL Mouth Rinse BID  . enoxaparin (LOVENOX) injection  40 mg Subcutaneous Q24H  . insulin aspart  0-5 Units Subcutaneous QHS  . insulin aspart  0-9 Units Subcutaneous TID WC  . mouth rinse  15 mL Mouth Rinse q12n4p  . PARoxetine  30 mg Oral Daily  . polyethylene glycol  17 g Oral Daily  . sodium chloride flush  3 mL Intravenous Q12H  . tamsulosin  0.4 mg Oral Daily   Continuous Infusions: . sodium chloride 150 mL/hr at 05/05/16 1620    Assessment/Plan:  1. Acute kidney injury with obstructing stone. Patient went to the operating room for ureteral stent by Dr. Apolinar JunesBrandon urology. Continue IV fluid hydration and continue to monitor closely. 2.   Hypotension, obstructive ureteral stone, leukocytosis. Continue IV         fluid hydration. Empiric antibiotics. Follow-up cultures. 3.   History of Essential hypertension. Clonidine on hold watch closely         for rebound hypertension 4.   Depression on Paxil 5.   Type 2 diabetes mellitus on sliding scale  Code Status:     Code Status Orders        Start     Ordered   05/05/16 0000  Full code  Continuous     05/04/16 2359    Code Status History    Date Active Date Inactive Code Status Order ID Comments User Context   09/26/2015 10:31 AM 09/28/2015  9:48 PM Full Code [id number]  Arnaldo NatalMichael S Diamond, MD Inpatient     Disposition Plan: To be determined based on clinical status blood pressure and a urology plan  Consultants:  Urology  Antibiotics:  Rocephin  Time spent: 28 minutes  Alford HighlandWIETING, Ruthanna Macchia  [employer]

## 2016-05-05 NOTE — Anesthesia Procedure Notes (Signed)
Procedure Name: LMA Insertion Date/Time: 05/05/2016 9:53 AM Performed by: Stormy FabianURTIS, Zahriyah Joo Pre-anesthesia Checklist: Patient identified, Patient being monitored, Timeout performed, Emergency Drugs available and Suction available Patient Re-evaluated:Patient Re-evaluated prior to inductionOxygen Delivery Method: Circle system utilized Preoxygenation: Pre-oxygenation with 100% oxygen Intubation Type: IV induction Ventilation: Mask ventilation without difficulty LMA: LMA inserted LMA Size: 4.5 Tube type: Oral Number of attempts: 1 Placement Confirmation: positive ETCO2 and breath sounds checked- equal and bilateral Tube secured with: Tape Dental Injury: Teeth and Oropharynx as per pre-operative assessment

## 2016-05-05 NOTE — Consult Note (Addendum)
Urology Consult  I have been asked to see the patient by Dr. Anne HahnWillis, for evaluation and management of obstructing left ureteral stone.  Chief Complaint: left flank pain  History of Present Illness: Dustin Williamson is a 45 y.o. year old admitted overnight to the ICU for hypotention, leukocytosis to 16, acute kidney injury with Cr 1.96 and left obstructing ureteral stone.  A nonurgent consult was placed and urology was called around 9 AM this morning to assess the patient.  He initially presented to the emergency room yesterday with worsening left lower quadrant pain and felt like his stone is moving. His pain was poorly controlled. He denies any nausea, vomiting, fevers or chills. He was having dysuria and gross hematuria.  He previously presented to the emergency room on 04/16/2016 on a 5.4 mm mid left ureteral stone with hydronephrosis. At that time, he was clinically well and his pain was able to be controlled. Upon return to the emergency room yesterday, renal shunt did confirm the presence of ongoing left mild hydronephrosis concerning for persistent stone.  Upon evaluation in the emergency room, he was noted to be hypotensive with blood pressure in the 70/ 50s to 60s and bradycardia.  He also was noted to have relatively low oxygen saturation levels at 91%.  Overnight, his blood pressure is not improved significantly despite fluid boluses. He remains afebrile. His pain is persistent.  Past Medical History:  Diagnosis Date  . Depressed   . Diabetes mellitus without complication (HCC)   . Hypertension     Past Surgical History:  Procedure Laterality Date  . HERNIA REPAIR      Home Medications:  Current Meds  Medication Sig  . cloNIDine (CATAPRES) 0.2 MG tablet Take 1 tablet (0.2 mg total) by mouth 3 (three) times daily.  . metFORMIN (GLUCOPHAGE) 1000 MG tablet Take 1,000 mg by mouth 2 (two) times daily with a meal.  . PARoxetine (PAXIL) 30 MG tablet Take 30 mg by mouth  daily.  . propranolol ER (INDERAL LA) 160 MG SR capsule Take 1 capsule (160 mg total) by mouth daily.    Allergies: No Known Allergies  Family History  Problem Relation Age of Onset  . Diabetes Mellitus II Father     Social History:  reports that he has never smoked. He has never used smokeless tobacco. He reports that he does not drink alcohol or use drugs.  ROS: A complete review of systems was performed.  All systems are negative except for pertinent findings as noted.  Physical Exam:  Vital signs in last 24 hours: Temp:  [97.5 F (36.4 C)-97.9 F (36.6 C)] 97.9 F (36.6 C) (11/17 0630) Pulse Rate:  [50-88] 58 (11/17 0700) Resp:  [9-25] 20 (11/17 0700) BP: (69-106)/(51-72) 83/63 (11/17 0700) SpO2:  [86 %-100 %] 97 % (11/17 0700) Weight:  [213 lb (96.6 kg)-220 lb 10.9 oz (100.1 kg)] 220 lb 10.9 oz (100.1 kg) (11/17 0000) Constitutional:  Alert and oriented, No acute distress.  Family member, ? mother at bedside HEENT: [place] AT, moist mucus membranes.  Trachea midline, no masses Cardiovascular: Regular rate and rhythm, no clubbing, cyanosis, or edema. Respiratory: Normal respiratory effort, lungs clear bilaterally GI: Abdomen is soft, nondistended.  Tenderness in the left lower quadrant. GU: Mild left CVA tenderness Skin: No rashes, bruises or suspicious lesions Lymph: No cervical or inguinal adenopathy Neurologic: Grossly intact, no focal deficits, moving all 4 extremities Psychiatric: Normal mood and affect   Laboratory Data:  Recent Labs  05/04/16 1643 05/05/16 0408  WBC 16.2* 10.5  HGB 19.3* 15.3  HCT 57.0* 45.7    Recent Labs  05/04/16 1643 05/05/16 0408  NA 136 141  K 4.7 4.2  CL 105 111  CO2 21* 26  GLUCOSE 325* 137*  BUN 17 18  CREATININE 1.96* 1.97*  CALCIUM 9.1 7.8*   No results for input(s): LABPT, INR in the last 72 hours. No results for input(s): LABURIN in the last 72 hours. Results for orders placed or performed during the hospital  encounter of 05/04/16  MRSA PCR Screening     Status: None   Collection Time: 05/05/16 12:00 AM  Result Value Ref Range Status   MRSA by PCR NEGATIVE NEGATIVE Final    Comment:        The GeneXpert MRSA Assay (FDA approved for NASAL specimens only), is one component of a comprehensive MRSA colonization surveillance program. It is not intended to diagnose MRSA infection nor to guide or monitor treatment for MRSA infections.      Radiologic Imaging: [place]  Result Date: 05/04/2016 CLINICAL DATA:  Subacute onset of left flank pain. Initial encounter. EXAM: RENAL / URINARY TRACT ULTRASOUND COMPLETE COMPARISON:  CT of the abdomen and pelvis from 04/16/2016 FINDINGS: Right Kidney: Length: 11.3 cm. Echogenicity within normal limits. A nonobstructing 6 mm stone is noted at the interpole region of the right kidney. No mass or hydronephrosis visualized. Left Kidney: Length: 11.7 cm. Echogenicity within normal limits. A nonobstructing 7 mm stone is noted at the interpole region of the left kidney. Minimal left-sided hydronephrosis is suggested. No mass visualized. Bladder: Decompressed and not well characterized. The right ureteral jet is visualized. The left ureteral jet is not well seen. IMPRESSION: 1. Minimal left-sided hydronephrosis suggested. This could reflect mild underlying obstruction, as the left ureteral jet is not visualized. 2. Nonobstructing bilateral renal stones seen, measuring up to 7 mm in size. Electronically Signed   By: Roanna RaiderJeffery  Chang M.D.   On: 05/04/2016 18:34    Impression/Assessment:  45 year old male with obstructing left ureteral stone and persistent hypotension despite fluid resuscitation. In the setting of his clinical picture as well as leukocytosis and acute renal failure.  I suspect that the stone may be the source with possible underlying sepsis.  I recommended emergent left ureteral stent placement to decompress his left collecting system. If his urine and  purulent, will obtain repeat urine cultures from the left kidney.  We discussed risk and benefits of ureteral stent placement and detail. He understands that we will not likely treat his stone today unless it sitting at the mouth of the ureteral orifice. He will need to return for definitive management of the stone. Risks including bleeding, infection, damage to surrounding structures, stent irritation were discussed in detail.  Plan:  -NPO -Consent signed and reviewed -site marked on left -return to ICU post op for supportive care -f/u urine culture -currently on rocephin started in ED  05/05/2016, 9:25 AM  Vanna ScotlandAshley Julene Rahn,  MD

## 2016-05-05 NOTE — Progress Notes (Signed)
Notified [facility] of the continued low blood pressure, patient assessed and at baseline since transfer to floor. Patient alert and orientated, resting in bed comfortably. Blood Sugar checked. Temperature checked. Denies any dizziness or light headiness. New order received, will continue to monitor and assess patient.

## 2016-05-05 NOTE — Progress Notes (Signed)
Dr Apolinar JunesBrandon called with grave concern as to why this patients consult was not called last night when the patient was admitted. Pt hypotensive with Left ureteral stone obstruction. Pt prepped for surgery. Consent is in the chart.

## 2016-05-06 LAB — CBC
HEMATOCRIT: 42.1 % (ref 40.0–52.0)
Hemoglobin: 14.6 g/dL (ref 13.0–18.0)
MCH: 31.4 pg (ref 26.0–34.0)
MCHC: 34.6 g/dL (ref 32.0–36.0)
MCV: 90.7 fL (ref 80.0–100.0)
Platelets: 172 10*3/uL (ref 150–440)
RBC: 4.64 MIL/uL (ref 4.40–5.90)
RDW: 12.6 % (ref 11.5–14.5)
WBC: 8.8 10*3/uL (ref 3.8–10.6)

## 2016-05-06 LAB — BASIC METABOLIC PANEL
Anion gap: 3 — ABNORMAL LOW (ref 5–15)
BUN: 13 mg/dL (ref 6–20)
CHLORIDE: 108 mmol/L (ref 101–111)
CO2: 24 mmol/L (ref 22–32)
Calcium: 7.6 mg/dL — ABNORMAL LOW (ref 8.9–10.3)
Creatinine, Ser: 1.55 mg/dL — ABNORMAL HIGH (ref 0.61–1.24)
GFR calc Af Amer: >60 mL/min (ref >60)
GFR calc non Af Amer: 52 mL/min — ABNORMAL LOW (ref >60)
GLUCOSE: 135 mg/dL — AB (ref 65–99)
POTASSIUM: 3.9 mmol/L (ref 3.5–5.1)
SODIUM: 135 mmol/L (ref 135–145)

## 2016-05-06 LAB — URINE CULTURE
Culture: NO GROWTH
Culture: NO GROWTH

## 2016-05-06 LAB — GLUCOSE, CAPILLARY
GLUCOSE-CAPILLARY: 118 mg/dL — AB (ref 65–99)
Glucose-Capillary: 183 mg/dL — ABNORMAL HIGH (ref 65–99)

## 2016-05-06 MED ORDER — INSULIN GLARGINE 100 UNIT/ML ~~LOC~~ SOLN: 24.0000 [IU] | Freq: Every day | SUBCUTANEOUS | 11 refills | Status: DC

## 2016-05-06 MED ORDER — CEPHALEXIN 500 MG PO CAPS: 500.0000 mg | ORAL_CAPSULE | Freq: Three times a day (TID) | ORAL | Status: DC

## 2016-05-06 MED ORDER — OXYCODONE-ACETAMINOPHEN 5-325 MG PO TABS: 1.0000 | ORAL_TABLET | Freq: Four times a day (QID) | ORAL | 0 refills | Status: DC | PRN

## 2016-05-06 MED ORDER — CEPHALEXIN 500 MG PO CAPS: 500.0000 mg | ORAL_CAPSULE | Freq: Three times a day (TID) | ORAL | 0 refills | Status: DC

## 2016-05-06 MED ORDER — OXYBUTYNIN CHLORIDE 5 MG PO TABS: 5.0000 mg | ORAL_TABLET | Freq: Three times a day (TID) | ORAL | 0 refills | Status: DC | PRN

## 2016-05-06 NOTE — Progress Notes (Signed)
Urology Consult Follow Up  Subjective: Foley in place, clear.  Feeling better.  BP improved. Afebrile.  No complaints.   Anti-infectives: Anti-infectives    Start     Dose/Rate Route Frequency Ordered Stop   05/06/16 1400  cephALEXin (KEFLEX) capsule 500 mg     500 mg Oral Every 8 hours 05/06/16 1251     05/06/16 0000  cephALEXin (KEFLEX) 500 MG capsule     500 mg Oral Every 8 hours 05/06/16 1253     05/04/16 2030  cefTRIAXone (ROCEPHIN) 1 g in dextrose 5 % 50 mL IVPB  Status:  Discontinued     1 g 100 mL/hr over 30 Minutes Intravenous  Once 05/04/16 2025 05/04/16 2026   05/04/16 2030  cefTRIAXone (ROCEPHIN) IVPB 1 g     1 g 100 mL/hr over 30 Minutes Intravenous  Once 05/04/16 2026 05/04/16 2115      Current Facility-Administered Medications  Medication Dose Route Frequency Provider Last Rate Last Dose  . 0.9 %  sodium chloride infusion   Intravenous Continuous Alford Highlandichard Wieting, MD 125 mL/hr at 05/06/16 0746    . acetaminophen (TYLENOL) tablet 650 mg  650 mg Oral Q6H PRN Oralia Manisavid Willis, MD       Or  . acetaminophen (TYLENOL) suppository 650 mg  650 mg Rectal Q6H PRN Oralia Manisavid Willis, MD      . bisacodyl (DULCOLAX) EC tablet 10 mg  10 mg Oral Daily PRN Alford Highlandichard Wieting, MD      . cephALEXin [facility](KEFLEX) capsule 500 mg  500 mg Oral Q8H Richard Renae GlossWieting, MD      . chlorhexidine (PERIDEX) 0.12 % solution 15 mL  15 mL Mouth Rinse BID Alford Highlandichard Wieting, MD   15 mL at 05/06/16 1000  . enoxaparin (LOVENOX) injection 40 mg  40 mg Subcutaneous Q24H Oralia Manisavid Willis, MD   40 mg at 05/05/16 2028  . insulin aspart (novoLOG) injection 0-5 Units  0-5 Units Subcutaneous QHS Oralia Manisavid Willis, MD      . insulin aspart (novoLOG) injection 0-9 Units  0-9 Units Subcutaneous TID WC Oralia Manisavid Willis, MD   2 Units at 05/06/16 1154  . MEDLINE mouth rinse  15 mL Mouth Rinse q12n4p Alford Highlandichard Wieting, MD      . morphine 4 MG/ML injection 4 mg  4 mg Intravenous Q3H PRN Valentina GuScott D Christy, RPH   4 mg at 05/06/16 1154  . ondansetron (ZOFRAN)  tablet 4 mg  4 mg Oral Q6H PRN Oralia Manisavid Willis, MD       Or  . ondansetron [facility](ZOFRAN) injection 4 mg  4 mg Intravenous Q6H PRN Oralia Manisavid Willis, MD      . oxyCODONE-acetaminophen (PERCOCET/ROXICET) 5-325 MG per tablet 1 tablet  1 tablet Oral Q6H PRN Valentina GuScott D Christy, RPH   1 tablet at 05/06/16 0802  . PARoxetine (PAXIL) tablet 30 mg  30 mg Oral Daily Rolm BaptiseHolly N Gilliam, RPH   30 mg at 05/06/16 0746  . polyethylene glycol (MIRALAX / GLYCOLAX) packet 17 g  17 g Oral Daily Alford Highlandichard Wieting, MD   17 g at 05/06/16 0746  . sodium chloride flush (NS) 0.9 % injection 3 mL  3 mL Intravenous Q12H Oralia Manisavid Willis, MD   3 mL at 05/05/16 0000  . tamsulosin (FLOMAX) capsule 0.4 mg  0.4 mg Oral Daily Oralia Manisavid Willis, MD   0.4 mg at 05/06/16 0746     Objective: Vital signs in last 24 hours: Temp:  [97.7 F (36.5 C)-98 F (36.7 C)] 97.8 F (36.6 C) (11/18  [id number]) Pulse Rate:  [63-97] 63 (11/18 0749) Resp:  [15-19] 18 (11/18 0749) BP: (99-116)/(69-78) 111/69 (11/18 0749) SpO2:  [95 %-100 %] 97 % (11/18 0749)  Intake/Output from previous day: 11/17 0701 - 11/18 0700 In: 3111.3 [P.O.:480; I.V.:2631.3] Out: 2780 [Urine:2780] Intake/Output this shift: Total I/O In: 240 [P.O.:240] Out: 700 [Urine:700]   Physical Exam  Constitutional: He is oriented to person, place, and time and well-developed, well-nourished, and in no distress.  HENT:  Head: Normocephalic and atraumatic.  Neck: Normal range of motion.  Cardiovascular: Normal rate.   Pulmonary/Chest: Effort normal.  Abdominal: Soft. Bowel sounds are normal. He exhibits no distension.  Genitourinary: Penis normal.  Genitourinary Comments: Foley with clear yellow urine  Neurological: He is alert and oriented to person, place, and time.  Skin: Skin is warm and dry.  Psychiatric: Affect normal.    Lab Results:   Recent Labs  05/05/16 0408 05/06/16 0349  WBC 10.5 8.8  HGB 15.3 14.6  HCT 45.7 42.1  PLT 168 172   BMET  Recent Labs  05/05/16 0408  05/06/16 0349  NA 141 135  K 4.2 3.9  CL 111 108  CO2 26 24  GLUCOSE 137* 135*  BUN 18 13  CREATININE 1.97* 1.55*  CALCIUM 7.8* 7.6*   Studies/Results: [place]  Result Date: 05/04/2016 CLINICAL DATA:  Subacute onset of left flank pain. Initial encounter. EXAM: RENAL / URINARY TRACT ULTRASOUND COMPLETE COMPARISON:  CT of the abdomen and pelvis from 04/16/2016 FINDINGS: Right Kidney: Length: 11.3 cm. Echogenicity within normal limits. A nonobstructing 6 mm stone is noted at the interpole region of the right kidney. No mass or hydronephrosis visualized. Left Kidney: Length: 11.7 cm. Echogenicity within normal limits. A nonobstructing 7 mm stone is noted at the interpole region of the left kidney. Minimal left-sided hydronephrosis is suggested. No mass visualized. Bladder: Decompressed and not well characterized. The right ureteral jet is visualized. The left ureteral jet is not well seen. IMPRESSION: 1. Minimal left-sided hydronephrosis suggested. This could reflect mild underlying obstruction, as the left ureteral jet is not visualized. 2. Nonobstructing bilateral renal stones seen, measuring up to 7 mm in size. Electronically Signed   By: Roanna RaiderJeffery  Chang M.D.   On: 05/04/2016 18:34   UCx 11/16 negative Left ureteral culture pending  Assessment/ Plan: s/p Procedure(s): POD 1 s/p CYSTOSCOPY WITH STENT PLACEMENT  1. Left ureteral stone s/p stent.  OK to discharge home with urology outpt follow up (will arrange) to discuss definative management of the stone.  Please send with pain meds, flomax, and ditropan.  2. Acute renal failure- improved with stent/ foley.  Complete L ureteral obstruction on retrograde.  OK to d/c Foley.  3. Hypotension- suspect infection behind stone, final cultures pending.  Recommend ppx Keflex upon discharge.  4. Leukocytosis- resolved.    All questions answered/  disucssed with nurse, patient, and Dr. Hilton SinclairWeiting   LOS: 2 days    Vanna ScotlandAshley Shya Kovatch 05/06/2016

## 2016-05-06 NOTE — Discharge Instructions (Signed)
You have a ureteral stent in place.  This is a tube that extends from your kidney to your bladder.  This may cause urinary bleeding, burning with urination, and urinary frequency.  Please call our office or present to the ED if you develop fevers >101 or pain which is not able to be controlled with oral pain medications.  You may be given either Flomax and/ or ditropan to help with bladder spasms and stent pain in addition to pain medications.   ° °[facility] °[address], [address] °, [place] [address] °[phone] °

## 2016-05-06 NOTE — Progress Notes (Signed)
Patient discharge summary reviewed with verbal understanding. Alerted patient that urology office will call on Monday for f/u, if doesn't hear back to call to schedule. Foley was removed prior to discharge, urination x 4 without difficulty. Answered all questions. Escorted patient to ED entrance.

## 2016-05-06 NOTE — Discharge Summary (Signed)
[facility] - [place] at [facility]   PATIENT NAME: Dustin DowningJeffrey Williamson    MR#:  [id number]  DATE OF BIRTH:  01/26/1971  DATE OF ADMISSION:  05/04/2016 ADMITTING PHYSICIAN: Oralia Manisavid Willis, MD  DATE OF DISCHARGE: 05/06/2016  PRIMARY CARE PHYSICIAN: Emogene MorganAYCOCK, NGWE A, MD    ADMISSION DIAGNOSIS:  Ureterolithiasis [N20.1] Hyperglycemia [R73.9] Acute left flank pain [R10.9] Hypotension, unspecified hypotension type [I95.9] Leukocytosis, unspecified type [D72.829]  DISCHARGE DIAGNOSIS:  Principal Problem:   Hypotension Active Problems:   AKI (acute kidney injury) (HCC)   Diabetes (HCC)   Urinary obstruction   Acute left flank pain   SECONDARY DIAGNOSIS:   Past Medical History:  Diagnosis Date  . Depressed   . Diabetes mellitus without complication (HCC)   . Hypertension     HOSPITAL COURSE:   1. Acute kidney injury with obstructing stone. Patient had a ureteral stent by Dr. Apolinar JunesBrandon urology. Patient seen by Dr. Apolinar JunesBrandon this morning and okay for removal of Foley and discharge home with pain medication and antibiotic and Flomax. 2. Hypotension obstructive ureteral stone and leukocytosis. Patient was given IV fluid hydration and empiric antibiotics. Urine Cultures so far are negative at this point. Empiric Keflex. 3. History of essential hypertension. Patient takes propranolol and lisinopril. Both of these medications will have to be held upon discharge home. 4. Depression on Paxil 5. Type 2 diabetes mellitus. Here on sliding scale. Can go back on his Lantus 24 units at bedtime. 6. Acute kidney injury. Creatinine improved with IV fluid hydration. Continue to monitor as outpatient  DISCHARGE CONDITIONS:   Satisfactory  CONSULTS OBTAINED:  Treatment Team:  Vanna ScotlandAshley Brandon, MD  DRUG ALLERGIES:  No Known Allergies  DISCHARGE MEDICATIONS:   Current Discharge Medication List    START taking these medications   Details  cephALEXin (KEFLEX) 500 MG capsule Take 1  capsule (500 mg total) by mouth every 8 (eight) hours. Qty: 15 capsule, Refills: 0    oxybutynin (DITROPAN) 5 MG tablet Take 1 tablet (5 mg total) by mouth every 8 (eight) hours as needed for bladder spasms. Qty: 30 tablet, Refills: 0      CONTINUE these medications which have CHANGED   Details  insulin glargine (LANTUS) 100 UNIT/ML injection Inject 0.24 mLs (24 Units total) into the skin at bedtime. Qty: 10 mL, Refills: 11    oxyCODONE-acetaminophen (ROXICET) 5-325 MG tablet Take 1 tablet by mouth every 6 (six) hours as needed for severe pain. Qty: 12 tablet, Refills: 0      CONTINUE these medications which have NOT CHANGED   Details  metFORMIN (GLUCOPHAGE) 1000 MG tablet Take 1,000 mg by mouth 2 (two) times daily with a meal.    PARoxetine (PAXIL) 30 MG tablet Take 30 mg by mouth daily.    promethazine (PHENERGAN) 25 MG tablet Take 1 tablet (25 mg total) by mouth every 6 (six) hours as needed for nausea or vomiting. Qty: 15 tablet, Refills: 0    tamsulosin (FLOMAX) 0.4 MG CAPS capsule Take 1 capsule (0.4 mg total) by mouth daily. Qty: 30 capsule, Refills: 0      STOP taking these medications     cloNIDine (CATAPRES) 0.2 MG tablet      propranolol ER (INDERAL LA) 160 MG SR capsule      naproxen (NAPROSYN) 500 MG tablet          DISCHARGE INSTRUCTIONS:   Follow-up with Dr. Apolinar JunesBrandon this week for follow-up Follow-up medical doctor 2 weeks  If you experience worsening  of your admission symptoms, develop shortness of breath, life threatening emergency, suicidal or homicidal thoughts you must seek medical attention immediately by calling 911 or calling your MD immediately  if symptoms less severe.  You Must read complete instructions/literature along with all the possible adverse reactions/side effects for all the Medicines you take and that have been prescribed to you. Take any new Medicines after you have completely understood and accept all the possible adverse  reactions/side effects.   Please note  You were cared for by a hospitalist during your hospital stay. If you have any questions about your discharge medications or the care you received while you were in the hospital after you are discharged, you can call the unit and asked to speak with the hospitalist on call if the hospitalist that took care of you is not available. Once you are discharged, your primary care physician will handle any further medical issues. Please note that NO REFILLS for any discharge medications will be authorized once you are discharged, as it is imperative that you return to your primary care physician (or establish a relationship with a primary care physician if you do not have one) for your aftercare needs so that they can reassess your need for medications and monitor your lab values.    Today   CHIEF COMPLAINT:   Chief Complaint  Patient presents with  . Flank Pain    HISTORY OF PRESENT ILLNESS:  Dustin Williamson  is a 45 y.o. male with a known history of Presented with flank pain and found to have a kidney stone   VITAL SIGNS:  Blood pressure 122/78, pulse 70, temperature 97.8 F (36.6 C), temperature source Oral, resp. rate 18, height 5\' 6"  (1.676 m), weight 100.1 kg (220 lb 10.9 oz), SpO2 98 %.   PHYSICAL EXAMINATION:  GENERAL:  44 y.o.-year-old patient lying in the bed with no acute distress.  EYES: Pupils equal, round, reactive to light and accommodation. No scleral icterus. Extraocular muscles intact.  HEENT: Head atraumatic, normocephalic. Oropharynx and nasopharynx clear.  NECK:  Supple, no jugular venous distention. No thyroid enlargement, no tenderness.  LUNGS: Normal breath sounds bilaterally, no wheezing, rales,rhonchi or crepitation. No use of accessory muscles of respiration.  CARDIOVASCULAR: S1, S2 normal. No murmurs, rubs, or gallops.  ABDOMEN: Soft, Some lower abdominal pain, non-distended. Bowel sounds present. No organomegaly or mass.   EXTREMITIES: Trace edema. No cyanosis, or clubbing.  NEUROLOGIC: Cranial nerves II through XII are intact. Muscle strength 5/5 in all extremities. Sensation intact. Gait not checked.  PSYCHIATRIC: The patient is alert and oriented x 3.  SKIN: No obvious rash, lesion, or ulcer.   DATA REVIEW:   CBC  Recent Labs Lab 05/06/16 0349  WBC 8.8  HGB 14.6  HCT 42.1  PLT 172    Chemistries   Recent Labs Lab 05/06/16 0349  NA 135  K 3.9  CL 108  CO2 24  GLUCOSE 135*  BUN 13  CREATININE 1.55*  CALCIUM 7.6*    Cardiac Enzymes  Recent Labs Lab 05/04/16 1643  TROPONINI <0.03    Microbiology Results  Results for orders placed or performed during the hospital encounter of 05/04/16  Urine culture     Status: None   Collection Time: 05/04/16  4:43 PM  Result Value Ref Range Status   Specimen Description URINE, CATHETERIZED  Final   Special Requests NONE  Final   Culture NO GROWTH Performed at [facility]   Final   Report Status  05/06/2016 FINAL  Final  MRSA PCR Screening     Status: None   Collection Time: 05/05/16 12:00 AM  Result Value Ref Range Status   MRSA by PCR NEGATIVE NEGATIVE Final    Comment:        The GeneXpert MRSA Assay (FDA approved for NASAL specimens only), is one component of a comprehensive MRSA colonization surveillance program. It is not intended to diagnose MRSA infection nor to guide or monitor treatment for MRSA infections.   Urine culture     Status: None   Collection Time: 05/05/16 10:02 AM  Result Value Ref Range Status   Specimen Description KIDNEY  Final   Special Requests LEFT  Final   Culture NO GROWTH [facility]   Final   Report Status 05/06/2016 FINAL  Final    RADIOLOGY:  [place] Renal  Result Date: 05/04/2016 CLINICAL DATA:  Subacute onset of left flank pain. Initial encounter. EXAM: RENAL / URINARY TRACT ULTRASOUND COMPLETE COMPARISON:  CT of the abdomen and pelvis from 04/16/2016 FINDINGS:  Right Kidney: Length: 11.3 cm. Echogenicity within normal limits. A nonobstructing 6 mm stone is noted at the interpole region of the right kidney. No mass or hydronephrosis visualized. Left Kidney: Length: 11.7 cm. Echogenicity within normal limits. A nonobstructing 7 mm stone is noted at the interpole region of the left kidney. Minimal left-sided hydronephrosis is suggested. No mass visualized. Bladder: Decompressed and not well characterized. The right ureteral jet is visualized. The left ureteral jet is not well seen. IMPRESSION: 1. Minimal left-sided hydronephrosis suggested. This could reflect mild underlying obstruction, as the left ureteral jet is not visualized. 2. Nonobstructing bilateral renal stones seen, measuring up to 7 mm in size. Electronically Signed   By: Roanna RaiderJeffery  Chang M.D.   On: 05/04/2016 18:34   Management plans discussed with the patient, And he is in agreement.  CODE STATUS:     Code Status Orders        Start     Ordered   05/05/16 0000  Full code  Continuous     05/04/16 2359    Code Status History    Date Active Date Inactive Code Status Order ID Comments User Context   09/26/2015 10:31 AM 09/28/2015  9:48 PM Full Code [id number]  Arnaldo NatalMichael S Diamond, MD Inpatient      TOTAL TIME TAKING CARE OF THIS PATIENT: 35 minutes.    Alford HighlandWIETING, Latresha Yahr M.D on 05/06/2016 at 2:57 PM  Between 7am to 6pm - Pager - [phone]  After 6pm go to [email or web address] - password [email or web address]  [facility] Office  [phone]  CC: Primary care physician; Emogene MorganAYCOCK, NGWE A, MD

## 2016-05-08 NOTE — Anesthesia Postprocedure Evaluation (Signed)
Anesthesia Post Note  Patient: Charletta CousinJeffrey D Fretz  Procedure(s) Performed: Procedure(s) (LRB): CYSTOSCOPY WITH STENT PLACEMENT (Left)  Patient location during evaluation: PACU Anesthesia Type: General Level of consciousness: awake and alert Pain management: pain level controlled Vital Signs Assessment: post-procedure vital signs reviewed and stable Respiratory status: spontaneous breathing, nonlabored ventilation, respiratory function stable and patient connected to nasal cannula oxygen Cardiovascular status: blood pressure returned to baseline and stable Postop Assessment: no signs of nausea or vomiting Anesthetic complications: no    Last Vitals:  Vitals:   05/06/16 0749 05/06/16 1439  BP: 111/69 122/78  Pulse: 63 70  Resp: 18 18  Temp: 36.6 C 36.6 C    Last Pain:  Vitals:   05/06/16 1439  TempSrc: Oral  PainSc:                  Lenard SimmerAndrew Chanae Gemma

## 2016-05-09 ENCOUNTER — Other Ambulatory Visit: Payer: Self-pay | Admitting: Radiology

## 2016-05-09 ENCOUNTER — Ambulatory Visit (INDEPENDENT_AMBULATORY_CARE_PROVIDER_SITE_OTHER): Payer: Self-pay | Admitting: Urology

## 2016-05-09 ENCOUNTER — Encounter: Payer: Self-pay | Admitting: Urology

## 2016-05-09 VITALS — BP 119/78 | HR 70 | Ht 66.0 in | Wt 220.5 lb

## 2016-05-09 DIAGNOSIS — N132 Hydronephrosis with renal and ureteral calculous obstruction: Secondary | ICD-10-CM

## 2016-05-09 DIAGNOSIS — N200 Calculus of kidney: Secondary | ICD-10-CM

## 2016-05-09 DIAGNOSIS — N201 Calculus of ureter: Secondary | ICD-10-CM

## 2016-05-09 DIAGNOSIS — R310 Gross hematuria: Secondary | ICD-10-CM

## 2016-05-09 LAB — URINALYSIS, COMPLETE
BILIRUBIN UA: NEGATIVE
Ketones, UA: NEGATIVE
Nitrite, UA: NEGATIVE
PH UA: 6 (ref 5.0–7.5)
Specific Gravity, UA: 1.01 (ref 1.005–1.030)
Urobilinogen, Ur: 0.2 mg/dL (ref 0.2–1.0)

## 2016-05-09 LAB — MICROSCOPIC EXAMINATION
BACTERIA UA: NONE SEEN
RBC, UA: >30 /hpf — AB (ref 0–?)

## 2016-05-09 NOTE — Progress Notes (Signed)
05/09/2016 7:11 PM   Dustin CousinJeffrey D Williamson September 06, 1970 [id number]  Referring provider: Emogene MorganNgwe A Aycock, MD [address] [place], [place] [address]  Chief Complaint  Patient presents with  . New Patient (Initial Visit)    stone management per Dr. Apolinar JunesBrandon    HPI: Patient is a 45 year old Caucasian male who is status post left ureteral stent placement for a obstructing left distal ureteral calculus.  Background history Dustin GensJeffrey was admitted to the ICU for hypotension, leukocytosis to 16, acute kidney injury with Cr 1.96 and left obstructing ureteral stone.  He presented to the emergency room yesterday with worsening left lower quadrant pain and felt like his stone is moving. His pain was poorly controlled. He denies any nausea, vomiting, fevers or chills.  He was having dysuria and gross hematuria.  He previously presented to the emergency room on 04/16/2016 on a 5.4 mm mid left ureteral stone with hydronephrosis. At that time, he was clinically well and his pain was able to be controlled. Upon return to the emergency room yesterday, renal ultrasound did confirm the presence of ongoing left mild hydronephrosis concerning for persistent stone.  Upon evaluation in the emergency room, he was noted to be hypotensive with blood pressure in the 70/ 50s to 60s and bradycardia.  He also was noted to have relatively low oxygen saturation levels at 91%.  Overnight, his blood pressure is not improved significantly despite fluid boluses. He remains afebrile. His pain is persistent.  He underwent emergent left ureteral stent placement on 05/05/2016.  His clinical course did improve over the next few days and he was discharged from hospital.  Today, he complains of frequent urination, dysuria, nocturia, intermittency, straining to urinate and gross hematuria.  He presents today with his daughter, Dustin Williamson.   He specifically denies any fevers, chills, nausea or vomiting.  He is experiencing night sweats.  He  did not pick up his antibiotic until yesterday.    He does have a prior history of nephrolithiasis. He states he underwent ESWL approximately 10 years ago.  He was drinking a lot of soda until recently, but now he is drinking water.  His UA today is positive for greater than 30 RBCs per high prior field. This is consistent with a stent placement.    PMH: Past Medical History:  Diagnosis Date  . Anxiety   . Depressed   . Diabetes mellitus without complication (HCC)   . Heartburn   . HLD (hyperlipidemia)   . Hypertension   . Kidney stone   . Sleep apnea     Surgical History: Past Surgical History:  Procedure Laterality Date  . CYSTOSCOPY WITH STENT PLACEMENT Left 05/05/2016   Procedure: CYSTOSCOPY WITH STENT PLACEMENT;  Surgeon: Vanna ScotlandAshley Brandon, MD;  Location: [facility] ORS;  Service: Urology;  Laterality: Left;  . HERNIA REPAIR    . KNEE SURGERY Left 2009    Home Medications:    Medication List       Accurate as of 05/09/16 11:59 PM. Always use your most recent med list.          atorvastatin 20 MG tablet Commonly known as:  LIPITOR Take 20 mg by mouth daily.   cephALEXin 500 MG capsule Commonly known as:  KEFLEX Take 1 capsule (500 mg total) by mouth every 8 (eight) hours.   hydrOXYzine 25 MG tablet Commonly known as:  ATARAX/VISTARIL Take 25 mg by mouth 3 (three) times daily.   insulin glargine 100 UNIT/ML injection Commonly known as:  LANTUS Inject 0.24 mLs (24 Units total) into the skin at bedtime.   lisinopril 10 MG tablet Commonly known as:  PRINIVIL,ZESTRIL Take 10 mg by mouth daily.   metFORMIN 1000 MG tablet Commonly known as:  GLUCOPHAGE Take 1,000 mg by mouth 2 (two) times daily with a meal.   oxybutynin 5 MG tablet Commonly known as:  DITROPAN Take 1 tablet (5 mg total) by mouth every 8 (eight) hours as needed for bladder spasms.   oxyCODONE-acetaminophen 5-325 MG tablet Commonly known as:  ROXICET Take 1 tablet by mouth every 6 (six) hours  as needed for severe pain.   PARoxetine 30 MG tablet Commonly known as:  PAXIL Take 30 mg by mouth daily.   promethazine 25 MG tablet Commonly known as:  PHENERGAN Take 1 tablet (25 mg total) by mouth every 6 (six) hours as needed for nausea or vomiting.   propranolol 80 MG tablet Commonly known as:  INDERAL Take 80 mg by mouth 2 (two) times daily.   tamsulosin 0.4 MG Caps capsule Commonly known as:  FLOMAX Take 1 capsule (0.4 mg total) by mouth daily.       Allergies: No Known Allergies  Family History: Family History  Problem Relation Age of Onset  . Diabetes Mellitus II Father   . Prostate cancer Neg Hx   . Kidney disease Neg Hx   . Bladder Cancer Neg Hx     Social History:  reports that he quit smoking about 15 years ago. He has never used smokeless tobacco. He reports that he does not drink alcohol or use drugs.  ROS: UROLOGY Frequent Urination?: Yes Hard to postpone urination?: No Burning/pain with urination?: Yes Get up at night to urinate?: Yes Leakage of urine?: No Urine stream starts and stops?: No Trouble starting stream?: Yes Do you have to strain to urinate?: Yes Blood in urine?: Yes Urinary tract infection?: No Sexually transmitted disease?: No Injury to kidneys or bladder?: Yes Painful intercourse?: No Weak stream?: No Erection problems?: No Penile pain?: Yes  Gastrointestinal Nausea?: Yes Vomiting?: No Indigestion/heartburn?: Yes Diarrhea?: No Constipation?: Yes  Constitutional Fever: No Night sweats?: Yes Weight loss?: Yes Fatigue?: Yes  Skin Skin rash/lesions?: No Itching?: No  Eyes Blurred vision?: Yes Double vision?: No  Ears/Nose/Throat Sore throat?: No Sinus problems?: No  Hematologic/Lymphatic Swollen glands?: No Easy bruising?: No  Cardiovascular Leg swelling?: No Chest pain?: Yes  Respiratory Cough?: No Shortness of breath?: Yes  Endocrine Excessive thirst?: Yes  Musculoskeletal Back pain?:  Yes Joint pain?: No  Neurological Headaches?: Yes Dizziness?: Yes  Psychologic Depression?: Yes Anxiety?: Yes  Physical Exam: BP 119/78   Pulse 70   Ht 5\' 6"  (1.676 m)   Wt 220 lb 8 oz (100 kg)   BMI 35.59 kg/m   Constitutional: Well nourished. Alert and oriented, No acute distress. HEENT: [place] AT, moist mucus membranes. Trachea midline, no masses. Cardiovascular: No clubbing, cyanosis, or edema. Respiratory: Normal respiratory effort, no increased work of breathing. GI: Abdomen is soft, non tender, non distended, no abdominal masses. Liver and spleen not palpable.  No hernias appreciated.  Stool sample for occult testing is not indicated.   GU: No CVA tenderness.  No bladder fullness or masses.   Skin: No rashes, bruises or suspicious lesions. Lymph: No cervical or inguinal adenopathy. Neurologic: Grossly intact, no focal deficits, moving all 4 extremities. Psychiatric: Normal mood and affect.  Laboratory Data: Lab Results  Component Value Date   WBC 9.3 05/12/2016   HGB 16.1  05/12/2016   HCT 46.3 05/12/2016   MCV 89.0 05/12/2016   PLT 251 05/12/2016    Lab Results  Component Value Date   CREATININE 1.58 (H) 05/12/2016    Lab Results  Component Value Date   HGBA1C 16.0 (H) 09/26/2015    Lab Results  Component Value Date   TSH 1.669 09/26/2015    Lab Results  Component Value Date   AST 29 09/26/2015   Lab Results  Component Value Date   ALT 33 09/26/2015    Urinalysis > 30 RBC's/hpf.  See EPIC.    Pertinent Imaging: CLINICAL DATA:  Left flank pain.  EXAM: CT ABDOMEN AND PELVIS WITHOUT CONTRAST  TECHNIQUE: Multidetector CT imaging of the abdomen and pelvis was performed following the standard protocol without IV contrast.  COMPARISON:  September 26, 2015  FINDINGS: Lower chest: No acute abnormality.  Hepatobiliary: No focal liver abnormality is seen. No gallstones, gallbladder wall thickening, or biliary dilatation.  Pancreas:  Unremarkable. No pancreatic ductal dilatation or surrounding inflammatory changes.  Spleen: Normal in size without focal abnormality.  Adrenals/Urinary Tract: The adrenal glands are within normal limits. Bilateral renal stones are identified with 2 small stones in the right lung and a small stone on the left. No hydronephrosis or acute perinephric stranding on the right. The right ureter is normal in appearance with no stones or dilatation. There is asymmetric perinephric stranding and hydronephrosis on the left. There is a stone in the mid left ureter measuring 5.4 mm on series 2, image 58. The stone is at the L4 level. No other stones seen within the left ureter.  Stomach/Bowel: Scattered colonic diverticulosis is seen without diverticulitis. The appendix is well seen with no appendicitis. The stomach and small bowel are normal.  Vascular/Lymphatic: No significant vascular findings are present. No enlarged abdominal or pelvic lymph nodes.  Reproductive: Uterus and bilateral adnexa are unremarkable.  Other: Hernia mesh seen in the left inguinal region.  Musculoskeletal: There is a hemangioma in the L5 vertebral body. Mild scattered degenerative changes.  IMPRESSION: 1. 5.4 mm obstructing stone in the mid left ureter. Small stones seen in both kidneys.   Electronically Signed   By: Gerome Sam III M.D   On: 04/16/2016 17:26  CLINICAL DATA:  Subacute onset of left flank pain. Initial encounter.  EXAM: RENAL / URINARY TRACT ULTRASOUND COMPLETE  COMPARISON:  CT of the abdomen and pelvis from 04/16/2016  FINDINGS: Right Kidney:  Length: 11.3 cm. Echogenicity within normal limits. A nonobstructing 6 mm stone is noted at the interpole region of the right kidney. No mass or hydronephrosis visualized.  Left Kidney:  Length: 11.7 cm. Echogenicity within normal limits. A nonobstructing 7 mm stone is noted at the interpole region of the left  kidney. Minimal left-sided hydronephrosis is suggested. No mass visualized.  Bladder:  Decompressed and not well characterized. The right ureteral jet is visualized. The left ureteral jet is not well seen.  IMPRESSION: 1. Minimal left-sided hydronephrosis suggested. This could reflect mild underlying obstruction, as the left ureteral jet is not visualized. 2. Nonobstructing bilateral renal stones seen, measuring up to 7 mm in size.   Electronically Signed   By: Roanna Raider M.D.   On: 05/04/2016 18:34   Assessment & Plan:    He will be scheduled for a left ureteroscopy with laser lithotripsy and ureteral stent exchange for definitive treatment of a 5.4 mm obstructing stone in the mid left ureter.  1. Left ureteral stone  - s/p emergent  left ureteral stent placement on 05/05/2016  - schedule left ureteroscopy with laser lithotripsy and ureteral stent exchange  - explained to the patient how the procedure is performed and the risks involved  - informed patient that they will have a stent placed during the procedure and will remain in place after the procedure for a short time.   - stent may be removed in the office with a cystoscope or patient may be instructed to remove the stent themselves by the string  - described "stent pain" as feelings of needing to urinate/overactive bladder and a warm, tingling sensation to intense pain in the affected flank  - residual stones within the kidney or ureter may be present after the procedure and may need to have these addressed at a different encounter  - injury to the ureter is the most common intra-operative risk, it may result in an open procedure to correct the defect  - infection and bleeding are also risks  - explained the risks of general anesthesia, such as: MI, CVA, paralysis, coma and/or death.  - advised to contact our office or seek treatment in the ED if becomes febrile or pain/ vomiting are difficult control in order to  arrange for emergent/urgent intervention  - Urinalysis, Complete  - CULTURE, URINE COMPREHENSIVE  2. Left hydronephrosis  - Patient was found to have left hydronephrosis due to an ureteral stone.  A RUS will be obtained one month after he has underwent URS  to ensure the hydronephrosis has resolved.    3. Gross hematuria  - We will continue to monitor the patient's UA after the treatment/passage of the stone to ensure the hematuria has resolved.  If hematuria persists, we will pursue a hematuria workup with CT Urogram and cystoscopy if appropriate.  4. Bilateral nephrolithiasis  - pursue 24 hour urine once patient has recovered from his left ureteral stone treatment   Return for left URS/LL/ureteral stent exchange.  These notes generated with voice recognition software. I apologize for typographical errors.  Michiel Cowboy, PA-C  [facility] [address] [place], [place] [address] [phone]

## 2016-05-11 LAB — CULTURE, URINE COMPREHENSIVE

## 2016-05-12 ENCOUNTER — Encounter: Payer: Self-pay | Admitting: Emergency Medicine

## 2016-05-12 ENCOUNTER — Emergency Department
Admission: EM | Admit: 2016-05-12 | Discharge: 2016-05-12 | Disposition: A | Discharge status: Home / Self Care | Payer: Self-pay | Attending: Emergency Medicine | Admitting: Emergency Medicine

## 2016-05-12 ENCOUNTER — Emergency Department: Payer: Self-pay

## 2016-05-12 DIAGNOSIS — R39198 Other difficulties with micturition: Secondary | ICD-10-CM | POA: Insufficient documentation

## 2016-05-12 DIAGNOSIS — I10 Essential (primary) hypertension: Secondary | ICD-10-CM | POA: Insufficient documentation

## 2016-05-12 DIAGNOSIS — R109 Unspecified abdominal pain: Secondary | ICD-10-CM

## 2016-05-12 DIAGNOSIS — Z87891 Personal history of nicotine dependence: Secondary | ICD-10-CM | POA: Insufficient documentation

## 2016-05-12 DIAGNOSIS — E119 Type 2 diabetes mellitus without complications: Secondary | ICD-10-CM | POA: Insufficient documentation

## 2016-05-12 DIAGNOSIS — Z79899 Other long term (current) drug therapy: Secondary | ICD-10-CM | POA: Insufficient documentation

## 2016-05-12 DIAGNOSIS — Z794 Long term (current) use of insulin: Secondary | ICD-10-CM | POA: Insufficient documentation

## 2016-05-12 DIAGNOSIS — R319 Hematuria, unspecified: Secondary | ICD-10-CM

## 2016-05-12 DIAGNOSIS — R1030 Lower abdominal pain, unspecified: Secondary | ICD-10-CM | POA: Insufficient documentation

## 2016-05-12 LAB — URINALYSIS COMPLETE WITH MICROSCOPIC (ARMC ONLY)
Bilirubin Urine: NEGATIVE
Glucose, UA: 50 mg/dL — AB
Ketones, ur: NEGATIVE mg/dL
Nitrite: NEGATIVE
Protein, ur: 100 mg/dL — AB
Specific Gravity, Urine: 1.025 (ref 1.005–1.030)
Squamous Epithelial / HPF: NONE SEEN
pH: 5 (ref 5.0–8.0)

## 2016-05-12 LAB — CBC WITH DIFFERENTIAL/PLATELET
Basophils Absolute: 0.1 10*3/uL (ref 0–0.1)
Basophils Relative: 2 %
EOS PCT: 3 %
Eosinophils Absolute: 0.3 10*3/uL (ref 0–0.7)
HCT: 46.3 % (ref 40.0–52.0)
HEMOGLOBIN: 16.1 g/dL (ref 13.0–18.0)
LYMPHS ABS: 3.6 10*3/uL (ref 1.0–3.6)
LYMPHS PCT: 39 %
MCH: 31 pg (ref 26.0–34.0)
MCHC: 34.8 g/dL (ref 32.0–36.0)
MCV: 89 fL (ref 80.0–100.0)
Monocytes Absolute: 0.7 10*3/uL (ref 0.2–1.0)
Monocytes Relative: 7 %
NEUTROS PCT: 49 %
Neutro Abs: 4.6 10*3/uL (ref 1.4–6.5)
Platelets: 251 10*3/uL (ref 150–440)
RBC: 5.21 MIL/uL (ref 4.40–5.90)
RDW: 12.3 % (ref 11.5–14.5)
WBC: 9.3 10*3/uL (ref 3.8–10.6)

## 2016-05-12 LAB — BASIC METABOLIC PANEL
Anion gap: 9 (ref 5–15)
BUN: 20 mg/dL (ref 6–20)
CHLORIDE: 106 mmol/L (ref 101–111)
CO2: 21 mmol/L — ABNORMAL LOW (ref 22–32)
Calcium: 9.1 mg/dL (ref 8.9–10.3)
Creatinine, Ser: 1.58 mg/dL — ABNORMAL HIGH (ref 0.61–1.24)
GFR calc Af Amer: 59 mL/min — ABNORMAL LOW (ref >60)
GFR calc non Af Amer: 51 mL/min — ABNORMAL LOW (ref >60)
GLUCOSE: 175 mg/dL — AB (ref 65–99)
POTASSIUM: 4.2 mmol/L (ref 3.5–5.1)
Sodium: 136 mmol/L (ref 135–145)

## 2016-05-12 MED ORDER — MORPHINE SULFATE (PF) 4 MG/ML IV SOLN
4.0000 mg | Freq: Once | INTRAVENOUS | Status: AC
  Administered 2016-05-12: 4 mg via INTRAVENOUS
  Filled 2016-05-12: qty 1

## 2016-05-12 MED ORDER — SODIUM CHLORIDE 0.9 % IV BOLUS (SEPSIS)
1000.0000 mL | Freq: Once | INTRAVENOUS | Status: AC
  Administered 2016-05-12: 1000 mL via INTRAVENOUS

## 2016-05-12 NOTE — ED Provider Notes (Signed)
[facility] Emergency Department Provider Note    ____________________________________________   I have reviewed the triage vital signs and the nursing notes.   HISTORY  Chief Complaint Flank Pain   History limited by: Not Limited   HPI Charletta CousinJeffrey D Micallef is a 45 y.o. male who presents to the emergency department today because of concern for difficulty with urination. Patient had a cystoscopy with a left ureteral stent placement one week ago. He states he was doing okay for the first couple of days after this procedure. However he then started noticing more bleeding. Today he feels like most of his pain is in the suprapubic region. He additionally was having a hard time urinating with just dribbling this afternoon. Denies any back pain. No fevers.   Past Medical History:  Diagnosis Date  . Anxiety   . Depressed   . Diabetes mellitus without complication (HCC)   . Heartburn   . HLD (hyperlipidemia)   . Hypertension   . Kidney stone   . Sleep apnea     Patient Active Problem List   Diagnosis Date Noted  . Acute left flank pain   . AKI (acute kidney injury) (HCC) 05/04/2016  . Diabetes (HCC) 05/04/2016  . Hypotension 05/04/2016  . Urinary obstruction 05/04/2016  . Hyperglycemia 09/26/2015  . CLOSED FRACTURE OF BASE OF OTHER METACARPAL BONE 03/09/2009    Past Surgical History:  Procedure Laterality Date  . CYSTOSCOPY WITH STENT PLACEMENT Left 05/05/2016   Procedure: CYSTOSCOPY WITH STENT PLACEMENT;  Surgeon: Vanna ScotlandAshley Brandon, MD;  Location: [facility] ORS;  Service: Urology;  Laterality: Left;  . HERNIA REPAIR    . KNEE SURGERY Left 2009    Prior to Admission medications   Medication Sig Start Date End Date Taking? Authorizing Provider  atorvastatin (LIPITOR) 20 MG tablet Take 20 mg by mouth daily.    Historical Provider, MD  cephALEXin (KEFLEX) 500 MG capsule Take 1 capsule (500 mg total) by mouth every 8 (eight) hours. 05/06/16   Alford Highlandichard Wieting, MD   hydrOXYzine (ATARAX/VISTARIL) 25 MG tablet Take 25 mg by mouth 3 (three) times daily.    Historical Provider, MD  insulin glargine (LANTUS) 100 UNIT/ML injection Inject 0.24 mLs (24 Units total) into the skin at bedtime. 05/06/16   Alford Highlandichard Wieting, MD  lisinopril (PRINIVIL,ZESTRIL) 10 MG tablet Take 10 mg by mouth daily.    Historical Provider, MD  metFORMIN (GLUCOPHAGE) 1000 MG tablet Take 1,000 mg by mouth 2 (two) times daily with a meal.    Historical Provider, MD  oxybutynin (DITROPAN) 5 MG tablet Take 1 tablet (5 mg total) by mouth every 8 (eight) hours as needed for bladder spasms. 05/06/16   Vanna ScotlandAshley Brandon, MD  oxyCODONE-acetaminophen (ROXICET) 5-325 MG tablet Take 1 tablet by mouth every 6 (six) hours as needed for severe pain. 05/06/16   Alford Highlandichard Wieting, MD  PARoxetine (PAXIL) 30 MG tablet Take 30 mg by mouth daily.    Historical Provider, MD  promethazine (PHENERGAN) 25 MG tablet Take 1 tablet (25 mg total) by mouth every 6 (six) hours as needed for nausea or vomiting. Patient not taking: Reported on 05/09/2016 04/16/16   Sharman CheekPhillip Stafford, MD  propranolol (INDERAL) 80 MG tablet Take 80 mg by mouth 2 (two) times daily.    Historical Provider, MD  tamsulosin (FLOMAX) 0.4 MG CAPS capsule Take 1 capsule (0.4 mg total) by mouth daily. 04/16/16   Sharman CheekPhillip Stafford, MD    Allergies Patient has no known allergies.  Family History  Problem Relation Age of Onset  . Diabetes Mellitus II Father   . Prostate cancer Neg Hx   . Kidney disease Neg Hx   . Bladder Cancer Neg Hx     Social History Social History  Substance Use Topics  . Smoking status: Former Smoker    Quit date: 06/19/2000  . Smokeless tobacco: Never Used  . Alcohol use No    Review of Systems  Constitutional: Negative for fever. Cardiovascular: Negative for chest pain. Respiratory: Negative for shortness of breath. Gastrointestinal: Positive for suprapubic pain. Genitourinary: Negative for dysuria. Musculoskeletal:  Negative for back pain. Skin: Negative for rash. Neurological: Negative for headaches, focal weakness or numbness.  10-point ROS otherwise negative.  ____________________________________________   PHYSICAL EXAM:  VITAL SIGNS: ED Triage Vitals [05/12/16 1723]  Enc Vitals Group     BP 120/77     Pulse Rate 85     Resp 16     Temp 98 F (36.7 C)     Temp Source Oral     SpO2 98 %     Weight 220 lb (99.8 kg)     Height 5\' 6"  (1.676 m)     Head Circumference      Peak Flow      Pain Score 10   Constitutional: Alert and oriented. Well appearing and in no distress. Eyes: Conjunctivae are normal. Normal extraocular movements. ENT   Head: Normocephalic and atraumatic.   Nose: No congestion/rhinnorhea.   Mouth/Throat: Mucous membranes are moist.   Neck: No stridor. Hematological/Lymphatic/Immunilogical: No cervical lymphadenopathy. Cardiovascular: Normal rate, regular rhythm.  No murmurs, rubs, or gallops.  Respiratory: Normal respiratory effort without tachypnea nor retractions. Breath sounds are clear and equal bilaterally. No wheezes/rales/rhonchi. Gastrointestinal: Soft. Tender to palpation in the lower abdomen.  Genitourinary: Deferred Musculoskeletal: Normal range of motion in all extremities. No lower extremity edema. Neurologic:  Normal speech and language. No gross focal neurologic deficits are appreciated.  Skin:  Skin is warm, dry and intact. No rash noted. Psychiatric: Mood and affect are normal. Speech and behavior are normal. Patient exhibits appropriate insight and judgment.  ____________________________________________    LABS (pertinent positives/negatives)  Labs Reviewed  URINALYSIS COMPLETEWITH MICROSCOPIC ([facility] ONLY) - Abnormal; Notable for the following:       Result Value   Color, Urine YELLOW (*)    APPearance HAZY (*)    Glucose, UA 50 (*)    Hgb urine dipstick 3+ (*)    Protein, ur 100 (*)    Leukocytes, UA TRACE (*)    Bacteria,  UA RARE (*)    All other components within normal limits  BASIC METABOLIC PANEL - Abnormal; Notable for the following:    CO2 21 (*)    Glucose, Bld 175 (*)    Creatinine, Ser 1.58 (*)    GFR calc non Af Amer 51 (*)    GFR calc Af Amer 59 (*)    All other components within normal limits  CBC WITH DIFFERENTIAL/PLATELET     ____________________________________________   EKG  None  ____________________________________________    RADIOLOGY  US renal IMPRESSION: 1. No significant hydronephrosis is visualized. Intrarenal portion of the stent not well identified. Distal portion of the stent is within the left aspect of the bladder. 2. Echodense foci within the bilateral kidneys, patient had previous CT demonstrated punctate stones within both kidneys.  ____________________________________________   PROCEDURES  Procedures  ____________________________________________   INITIAL IMPRESSION / ASSESSMENT AND PLAN / ED COURSE  Pertinent labs &  imaging results that were available during my care of the patient were reviewed by me and considered in my medical decision making (see chart for details).  Patient here with lower abdominal pain and hematuria. Recent stent placement. Workup here without any overly concerning findings. Creatinine more or less the patient's baseline. Patient's urine did have some white blood cells however is currently undergoing antibiotics for UTI. Additionally a renal ultrasound was obtained did not show any hydronephrosis. Patient had both initial and subsequent bladder scan without a significant bout of retained urine. This point I feel that patient is stable and safe for discharge. Will have patient follow-up with his urologist. ____________________________________________   FINAL CLINICAL IMPRESSION(S) / ED DIAGNOSES  Final diagnoses:  Hematuria, unspecified type  Abdominal pain, unspecified abdominal location     Note: This dictation was  prepared with Dragon dictation. Any transcriptional errors that result from this process are unintentional    Phineas Semen, MD 05/12/16 2257

## 2016-05-12 NOTE — ED Triage Notes (Signed)
Pt reports hematuria this morning, had stent placed last week for kidney stone. Pt reports urinary frequency and difficulty urinating.

## 2016-05-12 NOTE — Discharge Instructions (Signed)
Please seek medical attention for any high fevers, chest pain, shortness of breath, change in behavior, persistent vomiting, bloody stool or any other new or concerning symptoms.  

## 2016-05-12 NOTE — ED Notes (Signed)
Pt. Requested meal tray and sprite.  Pt. Told to wait for Ultra Sounds results to come back.

## 2016-05-15 ENCOUNTER — Telehealth: Payer: Self-pay | Admitting: Urology

## 2016-05-15 ENCOUNTER — Other Ambulatory Visit: Payer: Self-pay | Admitting: Urology

## 2016-05-15 MED ORDER — OXYCODONE-ACETAMINOPHEN 5-325 MG PO TABS: 1.0000 | ORAL_TABLET | Freq: Four times a day (QID) | ORAL | 0 refills | Status: DC | PRN

## 2016-05-15 NOTE — Telephone Encounter (Signed)
Patient called the office requesting more pain medication.    He does not have a phone, but will call the office back to check on the prescription.

## 2016-05-15 NOTE — Telephone Encounter (Signed)
Pt came into office but left before anyone was able to help him.

## 2016-05-15 NOTE — Telephone Encounter (Signed)
Please have Carollee HerterShannon address this since she is in the office and has seen him most recently.    Vanna ScotlandAshley Elizibeth Breau, MD

## 2016-05-20 ENCOUNTER — Emergency Department
Admission: EM | Admit: 2016-05-20 | Discharge: 2016-05-20 | Disposition: A | Discharge status: Home / Self Care | Payer: Self-pay | Attending: Emergency Medicine | Admitting: Emergency Medicine

## 2016-05-20 DIAGNOSIS — E119 Type 2 diabetes mellitus without complications: Secondary | ICD-10-CM | POA: Insufficient documentation

## 2016-05-20 DIAGNOSIS — I10 Essential (primary) hypertension: Secondary | ICD-10-CM | POA: Insufficient documentation

## 2016-05-20 DIAGNOSIS — Z79899 Other long term (current) drug therapy: Secondary | ICD-10-CM | POA: Insufficient documentation

## 2016-05-20 DIAGNOSIS — Z87891 Personal history of nicotine dependence: Secondary | ICD-10-CM | POA: Insufficient documentation

## 2016-05-20 DIAGNOSIS — N200 Calculus of kidney: Secondary | ICD-10-CM

## 2016-05-20 DIAGNOSIS — Z794 Long term (current) use of insulin: Secondary | ICD-10-CM | POA: Insufficient documentation

## 2016-05-20 MED ORDER — KETOROLAC TROMETHAMINE 30 MG/ML IJ SOLN
INTRAMUSCULAR | Status: AC
  Filled 2016-05-20: qty 1

## 2016-05-20 MED ORDER — KETOROLAC TROMETHAMINE 30 MG/ML IJ SOLN
30.0000 mg | Freq: Once | INTRAMUSCULAR | Status: AC
  Administered 2016-05-20: 30 mg via INTRAVENOUS

## 2016-05-20 NOTE — ED Provider Notes (Signed)
[facility] Emergency Department Provider Note    First MD Initiated Contact with Patient 05/20/16 [phone]     (approximate)  I have reviewed the triage vital signs and the nursing notes.   HISTORY  Chief Complaint multiple medical complaints   HPI Dustin Williamson is a 45 y.o. male bolus of chronic medical conditions return to the emergency department with stated "kidney stone pain". Patient states that he had a urethral stent placed on 05/05/2016 and admits to left lower quadrant abdominal pain consistent with previous kidney stones. Patient states that he ran out of his Percocet's 2 days ago and was informed that he could not receive another prescription until December 3. In addition patient admits to left ear pain and multiple other medical complaints. However he states that he came tonight secondary to his discomfort and affect that he is ran out of his Percocet's.   Past Medical History:  Diagnosis Date  . Anxiety   . Depressed   . Diabetes mellitus without complication (HCC)   . Heartburn   . HLD (hyperlipidemia)   . Hypertension   . Kidney stone   . Sleep apnea     Patient Active Problem List   Diagnosis Date Noted  . Acute left flank pain   . AKI (acute kidney injury) (HCC) 05/04/2016  . Diabetes (HCC) 05/04/2016  . Hypotension 05/04/2016  . Urinary obstruction 05/04/2016  . Hyperglycemia 09/26/2015  . CLOSED FRACTURE OF BASE OF OTHER METACARPAL BONE 03/09/2009    Past Surgical History:  Procedure Laterality Date  . CYSTOSCOPY WITH STENT PLACEMENT Left 05/05/2016   Procedure: CYSTOSCOPY WITH STENT PLACEMENT;  Surgeon: Vanna Scotland, MD;  Location: [facility] ORS;  Service: Urology;  Laterality: Left;  . HERNIA REPAIR    . KNEE SURGERY Left 2009    Prior to Admission medications   Medication Sig Start Date End Date Taking? Authorizing Provider  atorvastatin (LIPITOR) 20 MG tablet Take 20 mg by mouth daily.    Historical Provider, MD    cephALEXin (KEFLEX) 500 MG capsule Take 1 capsule (500 mg total) by mouth every 8 (eight) hours. 05/06/16   Alford Highland, MD  hydrOXYzine (ATARAX/VISTARIL) 25 MG tablet Take 25 mg by mouth 3 (three) times daily.    Historical Provider, MD  insulin glargine (LANTUS) 100 UNIT/ML injection Inject 0.24 mLs (24 Units total) into the skin at bedtime. 05/06/16   Alford Highland, MD  lisinopril (PRINIVIL,ZESTRIL) 10 MG tablet Take 10 mg by mouth daily.    Historical Provider, MD  metFORMIN (GLUCOPHAGE) 1000 MG tablet Take 1,000 mg by mouth 2 (two) times daily with a meal.    Historical Provider, MD  oxybutynin (DITROPAN) 5 MG tablet Take 1 tablet (5 mg total) by mouth every 8 (eight) hours as needed for bladder spasms. 05/06/16   Vanna Scotland, MD  oxyCODONE-acetaminophen (ROXICET) 5-325 MG tablet Take 1 tablet by mouth every 6 (six) hours as needed for severe pain. 05/15/16   Carollee Herter A McGowan, PA-C  PARoxetine (PAXIL) 30 MG tablet Take 30 mg by mouth daily.    Historical Provider, MD  promethazine (PHENERGAN) 25 MG tablet Take 1 tablet (25 mg total) by mouth every 6 (six) hours as needed for nausea or vomiting. Patient not taking: Reported on 05/09/2016 04/16/16   Sharman Cheek, MD  propranolol (INDERAL) 80 MG tablet Take 80 mg by mouth 2 (two) times daily.    Historical Provider, MD  tamsulosin (FLOMAX) 0.4 MG CAPS capsule Take 1 capsule (  0.4 mg total) by mouth daily. 04/16/16   Sharman Cheek, MD    Allergies Patient has no known allergies.  Family History  Problem Relation Age of Onset  . Diabetes Mellitus II Father   . Prostate cancer Neg Hx   . Kidney disease Neg Hx   . Bladder Cancer Neg Hx     Social History Social History  Substance Use Topics  . Smoking status: Former Smoker    Quit date: 06/19/2000  . Smokeless tobacco: Never Used  . Alcohol use No    Review of Systems Constitutional: No fever/chills Eyes: No visual changes. ENT: No sore throat. Cardiovascular:  Denies chest pain. Respiratory: Denies shortness of breath. Gastrointestinal:Positive for left flank pain and nausea Genitourinary: Negative for dysuria. Musculoskeletal: Negative for back pain. Skin: Negative for rash. Neurological: Negative for headaches, focal weakness or numbness.  10-point ROS otherwise negative.  ____________________________________________   PHYSICAL EXAM:  VITAL SIGNS: ED Triage Vitals  Enc Vitals Group     BP 05/20/16 0333 (!) 140/106     Pulse Rate 05/20/16 0333 (!) 107     Resp 05/20/16 0333 16     Temp 05/20/16 0333 98.3 F (36.8 C)     Temp Source 05/20/16 0333 Oral     SpO2 05/20/16 0333 98 %     Weight 05/20/16 0333 220 lb (99.8 kg)     Height 05/20/16 0333 5\' 6"  (1.676 m)     Head Circumference --      Peak Flow --      Pain Score 05/20/16 0334 10     Pain Loc --      Pain Edu? --      Excl. in GC? --     Constitutional: Alert and oriented. Well appearing and in no acute distress. Eyes: Conjunctivae are normal. PERRL. EOMI. Head: Atraumatic. Ears:  Healthy appearing ear canals and TMs bilaterally Nose: No congestion/rhinnorhea. Mouth/Throat: Mucous membranes are moist.  Oropharynx non-erythematous. Neck: No stridor.   Cardiovascular: Normal rate, regular rhythm. Good peripheral circulation. Grossly normal heart sounds. Respiratory: Normal respiratory effort.  No retractions. Lungs CTAB. Gastrointestinal: Soft and nontender. No distention.  Musculoskeletal: No lower extremity tenderness nor edema. No gross deformities of extremities. Neurologic:  Normal speech and language. No gross focal neurologic deficits are appreciated.  Skin:  Skin is warm, dry and intact. No rash noted. Psychiatric: Mood and affect are normal. Speech and behavior are normal.  _  :   Procedures     INITIAL IMPRESSION / ASSESSMENT AND PLAN / ED COURSE  Pertinent labs & imaging results that were available during my care of the patient were reviewed by me  and considered in my medical decision making (see chart for details).  Patient given Toradol 30 mg will be prescribed for by mouth Toradol at home 10 mg tablets. Patient advised to follow-up with urologist   Clinical Course     ____________________________________________  FINAL CLINICAL IMPRESSION(S) / ED DIAGNOSES  Final diagnoses:  Kidney stones     MEDICATIONS GIVEN DURING THIS VISIT:  Medications  ketorolac (TORADOL) 30 MG/ML injection 30 mg (not administered)     NEW OUTPATIENT MEDICATIONS STARTED DURING THIS VISIT:  New Prescriptions   No medications on file    Modified Medications   No medications on file    Discontinued Medications   No medications on file     Note:  This document was prepared using Dragon voice recognition software and may include unintentional dictation errors.  Darci Currentandolph N Gradie Ohm, MD 05/20/16 [phone]

## 2016-05-20 NOTE — ED Triage Notes (Signed)
Pt ambulatory to triage with no difficulty. Pt reports he had a stent place for kidney stone on the left side on 11/171/17. Pt reports having a difficult time urinating and mostly passing blood. Pt also reports nausea, left ear pain, headache and chest pain. Pt talking in full and complete sentences with no difficulty at this time.

## 2016-05-21 ENCOUNTER — Encounter
Admission: EM | Disposition: A | Discharge status: Home / Self Care | Payer: Self-pay | Source: Home / Self Care | Attending: Urology

## 2016-05-21 ENCOUNTER — Inpatient Hospital Stay
Admission: EM | Admit: 2016-05-21 | Discharge: 2016-05-27 | DRG: 683 | Disposition: A | Discharge status: Home / Self Care | Payer: Self-pay | Attending: Urology | Admitting: Urology

## 2016-05-21 ENCOUNTER — Emergency Department: Payer: Self-pay

## 2016-05-21 ENCOUNTER — Emergency Department: Payer: Self-pay | Admitting: Anesthesiology

## 2016-05-21 ENCOUNTER — Encounter: Payer: Self-pay | Admitting: Anesthesiology

## 2016-05-21 DIAGNOSIS — Z79891 Long term (current) use of opiate analgesic: Secondary | ICD-10-CM

## 2016-05-21 DIAGNOSIS — F419 Anxiety disorder, unspecified: Secondary | ICD-10-CM | POA: Diagnosis present

## 2016-05-21 DIAGNOSIS — N179 Acute kidney failure, unspecified: Secondary | ICD-10-CM | POA: Diagnosis present

## 2016-05-21 DIAGNOSIS — Z791 Long term (current) use of non-steroidal anti-inflammatories (NSAID): Secondary | ICD-10-CM

## 2016-05-21 DIAGNOSIS — N201 Calculus of ureter: Secondary | ICD-10-CM

## 2016-05-21 DIAGNOSIS — F14129 Cocaine abuse with intoxication, unspecified: Secondary | ICD-10-CM | POA: Diagnosis present

## 2016-05-21 DIAGNOSIS — G4730 Sleep apnea, unspecified: Secondary | ICD-10-CM | POA: Diagnosis present

## 2016-05-21 DIAGNOSIS — N135 Crossing vessel and stricture of ureter without hydronephrosis: Secondary | ICD-10-CM | POA: Diagnosis present

## 2016-05-21 DIAGNOSIS — F1290 Cannabis use, unspecified, uncomplicated: Secondary | ICD-10-CM | POA: Diagnosis present

## 2016-05-21 DIAGNOSIS — N189 Chronic kidney disease, unspecified: Secondary | ICD-10-CM | POA: Diagnosis present

## 2016-05-21 DIAGNOSIS — Z87891 Personal history of nicotine dependence: Secondary | ICD-10-CM

## 2016-05-21 DIAGNOSIS — F329 Major depressive disorder, single episode, unspecified: Secondary | ICD-10-CM | POA: Diagnosis present

## 2016-05-21 DIAGNOSIS — E1122 Type 2 diabetes mellitus with diabetic chronic kidney disease: Secondary | ICD-10-CM | POA: Diagnosis present

## 2016-05-21 DIAGNOSIS — I129 Hypertensive chronic kidney disease with stage 1 through stage 4 chronic kidney disease, or unspecified chronic kidney disease: Principal | ICD-10-CM | POA: Diagnosis present

## 2016-05-21 DIAGNOSIS — Z794 Long term (current) use of insulin: Secondary | ICD-10-CM

## 2016-05-21 DIAGNOSIS — Z833 Family history of diabetes mellitus: Secondary | ICD-10-CM

## 2016-05-21 DIAGNOSIS — T83192A Other mechanical complication of urinary stent, initial encounter: Secondary | ICD-10-CM

## 2016-05-21 DIAGNOSIS — I4891 Unspecified atrial fibrillation: Secondary | ICD-10-CM | POA: Diagnosis present

## 2016-05-21 DIAGNOSIS — E1165 Type 2 diabetes mellitus with hyperglycemia: Secondary | ICD-10-CM | POA: Diagnosis present

## 2016-05-21 DIAGNOSIS — N132 Hydronephrosis with renal and ureteral calculous obstruction: Secondary | ICD-10-CM | POA: Diagnosis present

## 2016-05-21 LAB — URINE DRUG SCREEN, QUALITATIVE (ARMC ONLY)
AMPHETAMINES, UR SCREEN: NOT DETECTED
BENZODIAZEPINE, UR SCRN: NOT DETECTED
Barbiturates, Ur Screen: NOT DETECTED
Cannabinoid 50 Ng, Ur ~~LOC~~: POSITIVE — AB
Cocaine Metabolite,Ur ~~LOC~~: POSITIVE — AB
MDMA (ECSTASY) UR SCREEN: NOT DETECTED
METHADONE SCREEN, URINE: NOT DETECTED
Opiate, Ur Screen: NOT DETECTED
PHENCYCLIDINE (PCP) UR S: NOT DETECTED
TRICYCLIC, UR SCREEN: NOT DETECTED

## 2016-05-21 LAB — CBC WITH DIFFERENTIAL/PLATELET
BASOS ABS: 0.1 10*3/uL (ref 0–0.1)
Basophils Relative: 1 %
EOS PCT: 2 %
Eosinophils Absolute: 0.2 10*3/uL (ref 0–0.7)
HEMATOCRIT: 46.4 % (ref 40.0–52.0)
Hemoglobin: 16.2 g/dL (ref 13.0–18.0)
LYMPHS PCT: 25 %
Lymphs Abs: 3 10*3/uL (ref 1.0–3.6)
MCH: 30.8 pg (ref 26.0–34.0)
MCHC: 34.9 g/dL (ref 32.0–36.0)
MCV: 88.3 fL (ref 80.0–100.0)
MONO ABS: 1 10*3/uL (ref 0.2–1.0)
MONOS PCT: 9 %
NEUTROS ABS: 7.5 10*3/uL — AB (ref 1.4–6.5)
Neutrophils Relative %: 63 %
PLATELETS: 223 10*3/uL (ref 150–440)
RBC: 5.25 MIL/uL (ref 4.40–5.90)
RDW: 12.4 % (ref 11.5–14.5)
WBC: 11.9 10*3/uL — ABNORMAL HIGH (ref 3.8–10.6)

## 2016-05-21 LAB — URINALYSIS COMPLETE WITH MICROSCOPIC (ARMC ONLY)
Bilirubin Urine: NEGATIVE
Nitrite: NEGATIVE
PROTEIN: 100 mg/dL — AB
SPECIFIC GRAVITY, URINE: 1.026 (ref 1.005–1.030)
SQUAMOUS EPITHELIAL / LPF: NONE SEEN
pH: 6 (ref 5.0–8.0)

## 2016-05-21 LAB — BASIC METABOLIC PANEL
Anion gap: 9 (ref 5–15)
BUN: 19 mg/dL (ref 6–20)
CO2: 22 mmol/L (ref 22–32)
Calcium: 9.4 mg/dL (ref 8.9–10.3)
Chloride: 105 mmol/L (ref 101–111)
Creatinine, Ser: 1.39 mg/dL — ABNORMAL HIGH (ref 0.61–1.24)
GFR calc Af Amer: >60 mL/min (ref >60)
GFR, EST NON AFRICAN AMERICAN: 60 mL/min — AB (ref >60)
GLUCOSE: 215 mg/dL — AB (ref 65–99)
POTASSIUM: 3.5 mmol/L (ref 3.5–5.1)
Sodium: 136 mmol/L (ref 135–145)

## 2016-05-21 LAB — ETHANOL

## 2016-05-21 SURGERY — CYSTOSCOPY, WITH STENT INSERTION
Anesthesia: General

## 2016-05-21 MED ORDER — LIDOCAINE HCL 2 % EX GEL
CUTANEOUS | Status: AC
  Administered 2016-05-21: 10
  Filled 2016-05-21: qty 10

## 2016-05-21 MED ORDER — IOTHALAMATE MEGLUMINE 17.2 % UR SOLN: 250.0000 mL | Freq: Once | URETHRAL | Status: DC | PRN

## 2016-05-21 MED ORDER — HYDROMORPHONE HCL 1 MG/ML IJ SOLN
1.0000 mg | Freq: Once | INTRAMUSCULAR | Status: AC
Start: 2016-05-21 — End: 2016-05-21
  Administered 2016-05-21: 1 mg via INTRAVENOUS
  Filled 2016-05-21: qty 1

## 2016-05-21 MED ORDER — ONDANSETRON HCL 4 MG/2ML IJ SOLN
4.0000 mg | Freq: Once | INTRAMUSCULAR | Status: AC
  Administered 2016-05-21: 4 mg via INTRAVENOUS
  Filled 2016-05-21: qty 2

## 2016-05-21 MED ORDER — SODIUM CHLORIDE 0.9 % IV BOLUS (SEPSIS)
1000.0000 mL | Freq: Once | INTRAVENOUS | Status: AC
  Administered 2016-05-21: 1000 mL via INTRAVENOUS

## 2016-05-21 MED ORDER — KETOROLAC TROMETHAMINE 30 MG/ML IJ SOLN
15.0000 mg | INTRAMUSCULAR | Status: AC
  Administered 2016-05-21: 15 mg via INTRAVENOUS
  Filled 2016-05-21: qty 1

## 2016-05-21 MED ORDER — IOPAMIDOL (ISOVUE-300) INJECTION 61%
300.0000 mL | Freq: Once | INTRAVENOUS | Status: AC | PRN
  Administered 2016-05-21: 300 mL via URETHRAL

## 2016-05-21 SURGICAL SUPPLY — 21 items
BAG DRAIN CYSTO-URO LG1000N (MISCELLANEOUS) ×3 IMPLANT
CATH FOL 2WAY LX 16X5 (CATHETERS) IMPLANT
CATH URETL 5X70 OPEN END (CATHETERS) ×3 IMPLANT
CONRAY 43 FOR UROLOGY 50M (MISCELLANEOUS) ×3 IMPLANT
GLOVE BIO SURGEON STRL SZ 6.5 (GLOVE) ×2 IMPLANT
GLOVE BIO SURGEONS STRL SZ 6.5 (GLOVE) ×1
GOWN STRL REUS W/ TWL LRG LVL4 (GOWN DISPOSABLE) ×2 IMPLANT
GOWN STRL REUS W/TWL LRG LVL4 (GOWN DISPOSABLE) ×4
HOLDER FOLEY CATH W/STRAP (MISCELLANEOUS) IMPLANT
KIT RM TURNOVER CYSTO AR (KITS) ×3 IMPLANT
PACK CYSTO AR (MISCELLANEOUS) ×3 IMPLANT
SENSORWIRE 0.038 NOT ANGLED (WIRE) ×3
SET CYSTO W/LG BORE CLAMP LF (SET/KITS/TRAYS/PACK) ×3 IMPLANT
SOL .9 NS 3000ML IRR  AL (IV SOLUTION) ×2
SOL .9 NS 3000ML IRR UROMATIC (IV SOLUTION) ×1 IMPLANT
STENT URET 6FRX24 CONTOUR (STENTS) IMPLANT
STENT URET 6FRX26 CONTOUR (STENTS) IMPLANT
SURGILUBE 2OZ TUBE FLIPTOP (MISCELLANEOUS) ×3 IMPLANT
SYRINGE IRR TOOMEY STRL 70CC (SYRINGE) ×3 IMPLANT
WATER STERILE IRR 1000ML POUR (IV SOLUTION) ×3 IMPLANT
WIRE SENSOR 0.038 NOT ANGLED (WIRE) ×1 IMPLANT

## 2016-05-21 NOTE — ED Triage Notes (Signed)
Pt here via ems, pt seen last night and diagnosed with an 8 mm kidney stone, pt had a stent placed recently for stones, pt is scheduled for the 18th for surgery, pt was given 100 mcg of fentanyl by ems pta

## 2016-05-21 NOTE — ED Provider Notes (Signed)
[facility] Emergency Department Provider Note  ____________________________________________  Time seen: Approximately 4:41 PM  I have reviewed the triage vital signs and the nursing notes.   HISTORY  Chief Complaint Abdominal Pain    HPI Dustin Williamson is a 45 y.o. male brought to the ED by EMS due to left lower quadrant pain. He reports that it is severe, onset last night. He was seen in the ED yesterday evening requesting a refill of Percocet that he represented as a long-term medication. Review of the controlled substance reporting system shows that he is obtained for 5 Percocet prescriptions from different providers each amounting to about 12 times each, not from any pain management clinic. He also has a history of cocaine abuse.  Reviewing electronic medical record when he was seen in the emergency room last night he was informed that we will be unable to provide him further Percocet prescriptions on command. He was discharged home with prescription for Toradol. He did not fill this and now complains of severe pain and requests Dilaudid.  Patient describes the pain is severe sharp left lower quadrant radiating to the groin. No nausea or vomiting. No constipation. No fevers or chills. Waxing and waning, sudden onset.  He does have a history of 5 mm left-sided ureteral lithiasis diagnosed on CT scan. He reports that a week ago urology inserted A stent in his ureter.Records show this was 2 weeks ago.     Past Medical History:  Diagnosis Date  . Anxiety   . Depressed   . Diabetes mellitus without complication (HCC)   . Heartburn   . HLD (hyperlipidemia)   . Hypertension   . Kidney stone   . Sleep apnea      Patient Active Problem List   Diagnosis Date Noted  . Acute left flank pain   . AKI (acute kidney injury) (HCC) 05/04/2016  . Diabetes (HCC) 05/04/2016  . Hypotension 05/04/2016  . Urinary obstruction 05/04/2016  . Hyperglycemia 09/26/2015   . CLOSED FRACTURE OF BASE OF OTHER METACARPAL BONE 03/09/2009     Past Surgical History:  Procedure Laterality Date  . CYSTOSCOPY WITH STENT PLACEMENT Left 05/05/2016   Procedure: CYSTOSCOPY WITH STENT PLACEMENT;  Surgeon: Vanna ScotlandAshley Brandon, MD;  Location: [facility] ORS;  Service: Urology;  Laterality: Left;  . HERNIA REPAIR    . KNEE SURGERY Left 2009     Prior to Admission medications   Medication Sig Start Date End Date Taking? Authorizing Provider  atorvastatin (LIPITOR) 20 MG tablet Take 20 mg by mouth daily.   Yes Historical Provider, MD  hydrOXYzine (ATARAX/VISTARIL) 25 MG tablet Take 25 mg by mouth 3 (three) times daily.   Yes Historical Provider, MD  insulin glargine (LANTUS) 100 UNIT/ML injection Inject 0.24 mLs (24 Units total) into the skin at bedtime. 05/06/16  Yes Richard Renae GlossWieting, MD  lisinopril (PRINIVIL,ZESTRIL) 10 MG tablet Take 10 mg by mouth daily.   Yes Historical Provider, MD  metFORMIN (GLUCOPHAGE) 1000 MG tablet Take 1,000 mg by mouth 2 (two) times daily with a meal.   Yes Historical Provider, MD  oxybutynin (DITROPAN) 5 MG tablet Take 1 tablet (5 mg total) by mouth every 8 (eight) hours as needed for bladder spasms. 05/06/16  Yes Vanna ScotlandAshley Brandon, MD  oxyCODONE-acetaminophen (ROXICET) 5-325 MG tablet Take 1 tablet by mouth every 6 (six) hours as needed for severe pain. 05/15/16  Yes Shannon A McGowan, PA-C  PARoxetine (PAXIL) 30 MG tablet Take 30 mg by mouth daily.  Yes Historical Provider, MD  propranolol (INDERAL) 80 MG tablet Take 80 mg by mouth 2 (two) times daily.   Yes Historical Provider, MD  tamsulosin (FLOMAX) 0.4 MG CAPS capsule Take 1 capsule (0.4 mg total) by mouth daily. 04/16/16  Yes Sharman CheekPhillip Smita Lesh, MD  cephALEXin (KEFLEX) 500 MG capsule Take 1 capsule (500 mg total) by mouth every 8 (eight) hours. Patient not taking: Reported on 05/21/2016 05/06/16   Alford Highlandichard Wieting, MD  promethazine (PHENERGAN) 25 MG tablet Take 1 tablet (25 mg total) by mouth every 6  (six) hours as needed for nausea or vomiting. Patient not taking: Reported on 05/09/2016 04/16/16   Sharman CheekPhillip Aleina Burgio, MD     Allergies Patient has no known allergies.   Family History  Problem Relation Age of Onset  . Diabetes Mellitus II Father   . Prostate cancer Neg Hx   . Kidney disease Neg Hx   . Bladder Cancer Neg Hx     Social History Social History  Substance Use Topics  . Smoking status: Former Smoker    Quit date: 06/19/2000  . Smokeless tobacco: Never Used  . Alcohol use No    Review of Systems  Constitutional:   No fever or chills.  ENT:   No sore throat. No rhinorrhea. Cardiovascular:   No chest pain. Respiratory:   No dyspnea or cough. Gastrointestinal:   Left lower quadrant abdominal pain. No vomiting or diarrhea.  Genitourinary:   Negative for dysuria or difficulty urinating. Musculoskeletal:   Negative for focal pain or swelling Neurological:   Negative for headaches 10-point ROS otherwise negative.  ____________________________________________   PHYSICAL EXAM:  VITAL SIGNS: ED Triage Vitals  Enc Vitals Group     BP --      Pulse Rate 05/21/16 1624 (!) 113     Resp 05/21/16 1624 20     Temp 05/21/16 1624 98.1 F (36.7 C)     Temp Source 05/21/16 1624 Oral     SpO2 05/21/16 1624 99 %     Weight 05/21/16 1625 213 lb (96.6 kg)     Height 05/21/16 1625 5\' 6"  (1.676 m)     Head Circumference --      Peak Flow --      Pain Score 05/21/16 1625 10     Pain Loc --      Pain Edu? --      Excl. in GC? --     Vital signs reviewed, nursing assessments reviewed.   Constitutional:   Alert and oriented. Uncomfortable but not in distress. Eyes:   No scleral icterus. No conjunctival pallor. PERRL. EOMI.  No nystagmus. ENT   Head:   Normocephalic and atraumatic.   Nose:   No congestion/rhinnorhea. No septal hematoma   Mouth/Throat:   MMM, no pharyngeal erythema. No peritonsillar mass. Breath smells of alcohol   Neck:   No stridor. No  SubQ emphysema. No meningismus. Hematological/Lymphatic/Immunilogical:   No cervical lymphadenopathy. Cardiovascular:   RRR. Symmetric bilateral radial and DP pulses.  No murmurs.  Respiratory:   Normal respiratory effort without tachypnea nor retractions. Breath sounds are clear and equal bilaterally. No wheezes/rales/rhonchi. Gastrointestinal:   Soft with significant voluntary guarding of the left side, limiting examination. No focal tenderness identified.. Non distended.  No rebound, rigidity, or guarding. Genitourinary:   deferred Musculoskeletal:   Nontender with normal range of motion in all extremities. No joint effusions.  No lower extremity tenderness.  No edema. Neurologic:   Vigorous speech, swearing and yelling..Marland Kitchen  CN 2-10 normal. Motor grossly intact. No gross focal neurologic deficits are appreciated.  Skin:    Skin is warm, dry and intact. No rash noted.  No petechiae, purpura, or bullae.  ____________________________________________    LABS (pertinent positives/negatives) (all labs ordered are listed, but only abnormal results are displayed) Labs Reviewed  BASIC METABOLIC PANEL - Abnormal; Notable for the following:       Result Value   Glucose, Bld 215 (*)    Creatinine, Ser 1.39 (*)    GFR calc non Af Amer 60 (*)    All other components within normal limits  CBC WITH DIFFERENTIAL/PLATELET - Abnormal; Notable for the following:    WBC 11.9 (*)    Neutro Abs 7.5 (*)    All other components within normal limits  URINALYSIS COMPLETEWITH MICROSCOPIC ([facility] ONLY) - Abnormal; Notable for the following:    Color, Urine YELLOW (*)    APPearance HAZY (*)    Glucose, UA >500 (*)    Ketones, ur TRACE (*)    Hgb urine dipstick 3+ (*)    Protein, ur 100 (*)    Leukocytes, UA TRACE (*)    Bacteria, UA RARE (*)    All other components within normal limits  URINE DRUG SCREEN, QUALITATIVE ([facility] ONLY) - Abnormal; Notable for the following:    Cocaine Metabolite,Ur [place] POSITIVE  (*)    Cannabinoid 50 Ng, Ur [place] POSITIVE (*)    All other components within normal limits  ETHANOL   ____________________________________________   EKG    ____________________________________________    RADIOLOGY  CT abdomen and pelvis reveals 6 mm stone in the distal left ureter, with moderate hydronephrosis, possibly indicating occluded stent KUB cystogram shows no reflux of contrast from the bladder through the left ureter or stent. Consistent with occluded stent  ____________________________________________   PROCEDURES Procedures ED cystogram performed by me at 7:40pm Foley inserted by nurse,  250 mL of contrast infused into bladder via foley using NG tube 60mL syringe.  KUB series performed Re-connected to foley drainage bag.  Pt tolerated well without complications. Pain well controlled.  ____________________________________________   INITIAL IMPRESSION / ASSESSMENT AND PLAN / ED COURSE  Pertinent labs & imaging results that were available during my care of the patient were reviewed by me and considered in my medical decision making (see chart for details).  Patient presents with left-sided abdominal pain. History of kidney stones with recent intervention by urology. We'll check labs urinalysis, give IV fluids and Toradol. Check CT scan. Although the patient does represent significant pain, there are exam features and historical features that suggest that the patient may be somewhat intoxicated, they be engaged in prescription drug diversion and substance abuse, so we will avoid opioids at this time although we will certainly offer the patient pain medication to control his symptoms.     Clinical Course as of May 21 2232  Wynelle Link May 21, 2016  1811 CT findings reviewed. D/w urology on call, Dr. Selena Batten rec performing cystogram in ED with foley / KUB. If positive for stent obstruction, may need further intervention. Creatinine better than baseline. UA non-infectious.    [PS]  2039 Cystogram consistent with stent obstruction. Patient's pain remains controlled after the Dilaudid. We'll continue to keep him nothing by mouth. Discussed again with Dr. Selena Batten who will come evaluate the patient.  [PS]    Clinical Course User Index [PS] Sharman Cheek, MD    ----------------------------------------- 10:33 PM on 05/21/2016 -----------------------------------------  Patient plan for  emergent surgical management with stent manipulation or replacement, further management by urology. Remains hemodynamically stable at this time. ____________________________________________   FINAL CLINICAL IMPRESSION(S) / ED DIAGNOSES  Final diagnoses:  Ureterolithiasis  Ureteral stent occlusion, initial encounter (HCC)  Chronic kidney disease, unspecified CKD stage       Portions of this note were generated with dragon dictation software. Dictation errors may occur despite best attempts at proofreading.    Sharman Cheek, MD 05/21/16 2233

## 2016-05-21 NOTE — Anesthesia Preprocedure Evaluation (Deleted)
Anesthesia Evaluation  Patient identified by MRN, date of birth, ID band Patient awake    Reviewed: Allergy & Precautions, H&P , NPO status , Patient's Chart, lab work & pertinent test results, reviewed documented beta blocker date and time   History of Anesthesia Complications Negative for: history of anesthetic complications  Airway Mallampati: IV  TM Distance: >3 FB Neck ROM: full    Dental  (+) Missing, Poor Dentition   Pulmonary neg pulmonary ROS, sleep apnea , former smoker,    Pulmonary exam normal        Cardiovascular Exercise Tolerance: Good hypertension, Pt. on medications and Pt. on home beta blockers (-) angina(-) CAD, (-) Past MI, (-) Cardiac Stents and (-) CABG Normal cardiovascular exam(-) dysrhythmias (-) Valvular Problems/Murmurs     Neuro/Psych PSYCHIATRIC DISORDERS Anxiety Depression negative neurological ROS     GI/Hepatic negative GI ROS, Neg liver ROS,   Endo/Other  diabetes, Type 2, Insulin Dependent, Oral Hypoglycemic Agents  Renal/GU Renal InsufficiencyRenal disease (kidney stones)  negative genitourinary   Musculoskeletal   Abdominal   Peds negative pediatric ROS (+)  Hematology negative hematology ROS (+)   Anesthesia Other Findings Past Medical History: No date: Depressed No date: Diabetes mellitus without complication (HCC) No date: Hypertension   Reproductive/Obstetrics negative OB ROS                             Anesthesia Physical  Anesthesia Plan  ASA: III and emergent  Anesthesia Plan: General   Post-op Pain Management:    Induction: Intravenous  Airway Management Planned: Oral ETT  Additional Equipment:   Intra-op Plan:   Post-operative Plan: Extubation in OR  Informed Consent: I have reviewed the patients History and Physical, chart, labs and discussed the procedure including the risks, benefits and alternatives for the proposed  anesthesia with the patient or authorized representative who has indicated his/her understanding and acceptance.   Dental Advisory Given  Plan Discussed with: Anesthesiologist, CRNA and Surgeon  Anesthesia Plan Comments:         Anesthesia Quick Evaluation

## 2016-05-21 NOTE — Consult Note (Signed)
Urology Consult  Referring physician: Marjean Donna, MD (ER) Reason for referral: Obstructed Left Ureteral Stent with Obstructing Left Ureteral Stone and Refractory Colic  Chief Complaint: LLQ Abd Pain  History of Present Illness: 45 y.o. WM s/p left ureteral stent placement 05/05/2016 by Dr. Erlene Quan for left ureteral obstruction due to a 655m left ureteral stone with severe sepsis. Multiple urine cultures, including selective urine from the left kidney obtained during stent placement returned negative, although the pt was on IV Rocephin. On POD #1, the pt remained afebrile without leukocytosis with improving AKI, and resolution of hypotension.  The pt was d/c'd on po Keflex 5036mtid and Percocet (5/325), #12. [facility]:30[facility] because he had run out of Percocet and was told he could not receive anymore until 05/21/2016.  The pt was given Toradol prescribed po Toradol.  [facility] c/o severe LLQ Abd Pain.  Pt reports being awakened from sleep at 7am with severe pain in the LLQ (sharp, 10/10, constant) associated with N/V and gross hematuria. Pt denies dysuria, F/C, nL BM's. In the ER, the pt was Afeb with mild tachycardia (102) and increased BP (142/98). Hypertension/tachycardia improved with IV Toradol, then IV Dilaudid 55m39mLab evaluation revealed a mild leukocytosis at 11.9k without a left shift. Cr with stable/improving AKI at 1.39. Glucose 215. Serum ethyl alcohol level <5 mg/dL.  Urine drug screen positive for cocaine and cannabinoid (pt denies any use).  Stone CT revealed moderate left hydro with a 6mm55mone in the distal left ureter with JJ stent in good position. Cystogram with 250mL7mCysto-Conray did not reveal any reflux of contrast into the left ureteral stent or left ureter/collecting system consistent with occlusion of the left ureteral stent.    Past Medical History:  Diagnosis Date  . Anxiety   . Depressed   .  Diabetes mellitus without complication (HCC) [place] Heartburn   . HLD (hyperlipidemia)   . Hypertension   . Kidney stone   . Sleep apnea    Past Surgical History:  Procedure Laterality Date  . CYSTOSCOPY WITH STENT PLACEMENT Left 05/05/2016   Procedure: CYSTOSCOPY WITH STENT PLACEMENT;  Surgeon: AshleHollice Espy  Location: [facility] ORS;  Service: Urology;  Laterality: Left;  . HERNIA REPAIR    . KNEE SURGERY Left 2009    Medications: I have reviewed the patient's current medications. Prior to Admission:  Current Discharge Medication List from D/C 05/06/2016        START taking these medications   Details  cephALEXin (KEFLEX) 500 MG capsule Take 1 capsule (500 mg total) by mouth every 8 (eight) hours. Qty: 15 capsule, Refills: 0    oxybutynin (DITROPAN) 5 MG tablet Take 1 tablet (5 mg total) by mouth every 8 (eight) hours as needed for bladder spasms. Qty: 30 tablet, Refills: 0          CONTINUE these medications which have CHANGED   Details  insulin glargine (LANTUS) 100 UNIT/ML injection Inject 0.24 mLs (24 Units total) into the skin at bedtime. Qty: 10 mL, Refills: 11    oxyCODONE-acetaminophen (ROXICET) 5-325 MG tablet Take 1 tablet by mouth every 6 (six) hours as needed for severe pain. Qty: 12 tablet, Refills: 0          CONTINUE these medications which have NOT CHANGED   Details  metFORMIN (GLUCOPHAGE) 1000 MG tablet Take 1,000 mg by mouth 2 (two) times daily  with a meal.    PARoxetine (PAXIL) 30 MG tablet Take 30 mg by mouth daily.    promethazine (PHENERGAN) 25 MG tablet Take 1 tablet (25 mg total) by mouth every 6 (six) hours as needed for nausea or vomiting. Qty: 15 tablet, Refills: 0    tamsulosin (FLOMAX) 0.4 MG CAPS capsule Take 1 capsule (0.4 mg total) by mouth daily. Qty: 30 capsule, Refills: 0         STOP taking these medications     cloNIDine (CATAPRES) 0.2 MG tablet      propranolol ER (INDERAL LA) 160 MG SR capsule       naproxen (NAPROSYN) 500 MG tablet    VOH:[id number] meglumine Allergies: No Known Allergies  Family History  Problem Relation Age of Onset  . Diabetes Mellitus II Father   . Prostate cancer Neg Hx   . Kidney disease Neg Hx   . Bladder Cancer Neg Hx    Social History:  reports that he quit smoking about 15 years ago (1ppd x10 yrs). He has never used smokeless tobacco. He reports that he does not drink alcohol or use drugs.  ROS 10 point system review negative except for as specified in the HPI.  Physical Exam:  Vital signs in last 24 hours: Temp:  [98.1 F (36.7 C)] 98.1 F (36.7 C) (12/03 1624) Pulse Rate:  [70-113] 79 (12/03 2200) Resp:  [20] 20 (12/03 1624) BP: (118-149)/(85-113) 119/90 (12/03 2200) SpO2:  [92 %-99 %] 92 % (12/03 2200) Weight:  [96.6 kg (213 lb)] 96.6 kg (213 lb) (12/03 1625)   Physical Exam  Nursing note and vitals reviewed. Constitutional: He is oriented to person, place, and time. He appears well-developed and well-nourished. No distress.  HENT:  Head: Normocephalic and atraumatic.  Eyes: Conjunctivae and EOM are normal. Left eye exhibits no discharge. No scleral icterus.  Neck: Normal range of motion. Neck supple. No JVD present. No tracheal deviation present. No thyromegaly present.  Cardiovascular: Normal rate, regular rhythm, normal heart sounds and intact distal pulses.  Exam reveals no gallop and no friction rub.   No murmur heard. Respiratory: Effort normal and breath sounds normal. No stridor. No respiratory distress. He has no wheezes. He has no rales. He exhibits no tenderness.  GI: Soft. Bowel sounds are normal. He exhibits no distension and no mass. There is tenderness. There is no rebound and no guarding.  LLQ tenderness. No CVAT.  Genitourinary: Penis normal. No penile tenderness.  Genitourinary Comments: Foley catheter in place draining clear amber urine. B/L descended testes - NT/no masses.  Musculoskeletal: Normal range of motion.  He exhibits no edema, tenderness or deformity.  Lymphadenopathy:    He has no cervical adenopathy.  Neurological: He is alert and oriented to person, place, and time.  Skin: Skin is warm and dry. No rash noted. No erythema. No pallor.  Psychiatric: He has a normal mood and affect. His behavior is normal. Judgment and thought content normal.    Laboratory Data:  Results for orders placed or performed during the hospital encounter of 05/21/16 (from the past 72 hour(s))  Basic metabolic panel     Status: Abnormal   Collection Time: 05/21/16  4:31 PM  Result Value Ref Range   Sodium 136 135 - 145 mmol/L   Potassium 3.5 3.5 - 5.1 mmol/L   Chloride 105 101 - 111 mmol/L   CO2 22 22 - 32 mmol/L   Glucose, Bld 215 (H) 65 - 99 mg/dL   BUN  19 6 - 20 mg/dL   Creatinine, Ser 1.39 (H) 0.61 - 1.24 mg/dL   Calcium 9.4 8.9 - 10.3 mg/dL   GFR calc non Af Amer 60 (L) >60 mL/min   GFR calc Af Amer >60 >60 mL/min    Comment: (NOTE) The eGFR has been calculated using the CKD EPI equation. This calculation has not been validated in all clinical situations. eGFR's persistently <60 mL/min signify possible Chronic Kidney Disease.    Anion gap 9 5 - 15  CBC with Differential     Status: Abnormal   Collection Time: 05/21/16  4:31 PM  Result Value Ref Range   WBC 11.9 (H) 3.8 - 10.6 K/uL   RBC 5.25 4.40 - 5.90 MIL/uL   Hemoglobin 16.2 13.0 - 18.0 g/dL   HCT 46.4 40.0 - 52.0 %   MCV 88.3 80.0 - 100.0 fL   MCH 30.8 26.0 - 34.0 pg   MCHC 34.9 32.0 - 36.0 g/dL   RDW 12.4 11.5 - 14.5 %   Platelets 223 150 - 440 K/uL   Neutrophils Relative % 63 %   Neutro Abs 7.5 (H) 1.4 - 6.5 K/uL   Lymphocytes Relative 25 %   Lymphs Abs 3.0 1.0 - 3.6 K/uL   Monocytes Relative 9 %   Monocytes Absolute 1.0 0.2 - 1.0 K/uL   Eosinophils Relative 2 %   Eosinophils Absolute 0.2 0 - 0.7 K/uL   Basophils Relative 1 %   Basophils Absolute 0.1 0 - 0.1 K/uL  Urinalysis complete, with microscopic     Status: Abnormal    Collection Time: 05/21/16  4:31 PM  Result Value Ref Range   Color, Urine YELLOW (A) YELLOW   APPearance HAZY (A) CLEAR   Glucose, UA >500 (A) NEGATIVE mg/dL   Bilirubin Urine NEGATIVE NEGATIVE   Ketones, ur TRACE (A) NEGATIVE mg/dL   Specific Gravity, Urine 1.026 1.005 - 1.030   Hgb urine dipstick 3+ (A) NEGATIVE   pH 6.0 5.0 - 8.0   Protein, ur 100 (A) NEGATIVE mg/dL   Nitrite NEGATIVE NEGATIVE   Leukocytes, UA TRACE (A) NEGATIVE   RBC / HPF TOO NUMEROUS TO COUNT 0 - 5 RBC/hpf   WBC, UA 6-30 0 - 5 WBC/hpf   Bacteria, UA RARE (A) NONE SEEN   Squamous Epithelial / LPF NONE SEEN NONE SEEN   Mucous PRESENT    Budding Yeast PRESENT   Ethanol     Status: None   Collection Time: 05/21/16  4:31 PM  Result Value Ref Range   Alcohol, Ethyl (B) <5 <5 mg/dL    Comment:        LOWEST DETECTABLE LIMIT FOR SERUM ALCOHOL IS 5 mg/dL FOR MEDICAL PURPOSES ONLY   Urine Drug Screen, Qualitative     Status: Abnormal   Collection Time: 05/21/16  4:31 PM  Result Value Ref Range   Tricyclic, Ur Screen NONE DETECTED NONE DETECTED   Amphetamines, Ur Screen NONE DETECTED NONE DETECTED   MDMA (Ecstasy)Ur Screen NONE DETECTED NONE DETECTED   Cocaine Metabolite,Ur [place] POSITIVE (A) NONE DETECTED   Opiate, Ur Screen NONE DETECTED NONE DETECTED   Phencyclidine (PCP) Ur S NONE DETECTED NONE DETECTED   Cannabinoid 50 Ng, Ur [place] POSITIVE (A) NONE DETECTED   Barbiturates, Ur Screen NONE DETECTED NONE DETECTED   Benzodiazepine, Ur Scrn NONE DETECTED NONE DETECTED   Methadone Scn, Ur NONE DETECTED NONE DETECTED    Comment: (NOTE) 706  Tricyclics, urine  Cutoff 1000 ng/mL 200  Amphetamines, urine             Cutoff 1000 ng/mL 300  MDMA (Ecstasy), urine           Cutoff 500 ng/mL 400  Cocaine Metabolite, urine       Cutoff 300 ng/mL 500  Opiate, urine                   Cutoff 300 ng/mL 600  Phencyclidine (PCP), urine      Cutoff 25 ng/mL 700  Cannabinoid, urine              Cutoff 50 ng/mL 800   Barbiturates, urine             Cutoff 200 ng/mL 900  Benzodiazepine, urine           Cutoff 200 ng/mL 1000 Methadone, urine                Cutoff 300 ng/mL 1100 1200 The urine drug screen provides only a preliminary, unconfirmed 1300 analytical test result and should not be used for non-medical 1400 purposes. Clinical consideration and professional judgment should 1500 be applied to any positive drug screen result due to possible 1600 interfering substances. A more specific alternate chemical method 1700 must be used in order to obtain a confirmed analytical result.  1800 Gas chromato graphy / mass spectrometry (GC/MS) is the preferred 1900 confirmatory method.    No results found for this or any previous visit (from the past 240 hour(s)). Creatinine:  Recent Labs  05/21/16 1631  CREATININE 1.39*   Baseline Creatinine: 1.22-1.24  Impression/Assessment:  1. Occluded left ureteral stent with recurrent left ureteral obstruction due to a distal 23m ureteral stone with refractory colic. -given the pt's positive drug screen for cocaine, anesthesia recommends deferring any non-emergent anesthesia until the urine test for cocaine becomes negative.  2. AKI - due to #1 3. IDDM - mild hyperglycemia (215) 4. HTN - stable   Plan:  1. Admit for pain control until left JJ stent can be changed (urine drug screen must turn negative for cocaine first). 2. PCA Morphine 3. Continue pre-admission meds, as prescribed  KDarcella Cheshire12/08/2015, 10:33 PM

## 2016-05-21 NOTE — ED Notes (Signed)
Dr Scotty Courtstafford and x-ray at bedside

## 2016-05-21 NOTE — ED Notes (Signed)
Pt here today c/o kidney stone pain, pt states that the pain is in his llq in the front. Pt is holding his front over his bladder. Pt is c/o severe pain at this time, pt arrives via ems with his discharge paperwork as well as his prescription attached from his last visit. Pt states that he hasn't taken his medication today states that he has been sleeping all day and was awakened out of his sleep from the pain

## 2016-05-21 NOTE — ED Notes (Signed)
Dr Scotty CourtStafford at bedside with CT results for pt

## 2016-05-22 DIAGNOSIS — N135 Crossing vessel and stricture of ureter without hydronephrosis: Secondary | ICD-10-CM

## 2016-05-22 DIAGNOSIS — N201 Calculus of ureter: Secondary | ICD-10-CM

## 2016-05-22 LAB — GLUCOSE, CAPILLARY
GLUCOSE-CAPILLARY: 156 mg/dL — AB (ref 65–99)
GLUCOSE-CAPILLARY: 81 mg/dL (ref 65–99)
GLUCOSE-CAPILLARY: 134 mg/dL — AB (ref 65–99)
Glucose-Capillary: 155 mg/dL — ABNORMAL HIGH (ref 65–99)

## 2016-05-22 LAB — URINE DRUG SCREEN, QUALITATIVE (ARMC ONLY)
Amphetamines, Ur Screen: NOT DETECTED
BARBITURATES, UR SCREEN: NOT DETECTED
BENZODIAZEPINE, UR SCRN: NOT DETECTED
Cannabinoid 50 Ng, Ur ~~LOC~~: NOT DETECTED
Cocaine Metabolite,Ur ~~LOC~~: POSITIVE — AB
MDMA (Ecstasy)Ur Screen: NOT DETECTED
METHADONE SCREEN, URINE: NOT DETECTED
OPIATE, UR SCREEN: POSITIVE — AB
Phencyclidine (PCP) Ur S: NOT DETECTED
TRICYCLIC, UR SCREEN: NOT DETECTED

## 2016-05-22 LAB — COMPREHENSIVE METABOLIC PANEL
ALK PHOS: 61 U/L (ref 38–126)
ALT: 14 U/L — AB (ref 17–63)
ANION GAP: 4 — AB (ref 5–15)
AST: 17 U/L (ref 15–41)
Albumin: 3.1 g/dL — ABNORMAL LOW (ref 3.5–5.0)
BILIRUBIN TOTAL: 0.3 mg/dL (ref 0.3–1.2)
BUN: 19 mg/dL (ref 6–20)
CALCIUM: 8.3 mg/dL — AB (ref 8.9–10.3)
CO2: 27 mmol/L (ref 22–32)
CREATININE: 1.63 mg/dL — AB (ref 0.61–1.24)
Chloride: 107 mmol/L (ref 101–111)
GFR, EST AFRICAN AMERICAN: 57 mL/min — AB (ref >60)
GFR, EST NON AFRICAN AMERICAN: 49 mL/min — AB (ref >60)
Glucose, Bld: 183 mg/dL — ABNORMAL HIGH (ref 65–99)
Potassium: 3.6 mmol/L (ref 3.5–5.1)
Sodium: 138 mmol/L (ref 135–145)
TOTAL PROTEIN: 5.3 g/dL — AB (ref 6.5–8.1)

## 2016-05-22 MED ORDER — INSULIN GLARGINE 100 UNIT/ML ~~LOC~~ SOLN
24.0000 [IU] | Freq: Every day | SUBCUTANEOUS | Status: DC
  Administered 2016-05-23 – 2016-05-25 (×3): 24 [IU] via SUBCUTANEOUS
  Filled 2016-05-22 (×5): qty 0.24

## 2016-05-22 MED ORDER — PROMETHAZINE HCL 25 MG/ML IJ SOLN: 12.5000 mg | Freq: Four times a day (QID) | INTRAMUSCULAR | Status: DC | PRN

## 2016-05-22 MED ORDER — DIPHENHYDRAMINE HCL 12.5 MG/5ML PO ELIX: 12.5000 mg | ORAL_SOLUTION | Freq: Four times a day (QID) | ORAL | Status: DC | PRN

## 2016-05-22 MED ORDER — ONDANSETRON HCL 4 MG/2ML IJ SOLN: 4.0000 mg | Freq: Four times a day (QID) | INTRAMUSCULAR | Status: DC | PRN

## 2016-05-22 MED ORDER — ONDANSETRON HCL 4 MG/2ML IJ SOLN
4.0000 mg | Freq: Four times a day (QID) | INTRAMUSCULAR | Status: DC | PRN
  Administered 2016-05-22 – 2016-05-23 (×2): 4 mg via INTRAVENOUS
  Filled 2016-05-22 (×2): qty 2

## 2016-05-22 MED ORDER — GLUCERNA SHAKE PO LIQD
237.0000 mL | ORAL | Status: DC
  Administered 2016-05-23 – 2016-05-25 (×3): 237 mL via ORAL

## 2016-05-22 MED ORDER — NALOXONE HCL 0.4 MG/ML IJ SOLN: 0.4000 mg | INTRAMUSCULAR | Status: DC | PRN

## 2016-05-22 MED ORDER — SODIUM CHLORIDE 0.9% FLUSH: 9.0000 mL | INTRAVENOUS | Status: DC | PRN

## 2016-05-22 MED ORDER — DIPHENHYDRAMINE HCL 50 MG/ML IJ SOLN: 12.5000 mg | Freq: Four times a day (QID) | INTRAMUSCULAR | Status: DC | PRN

## 2016-05-22 MED ORDER — DEXTROSE-NACL 5-0.45 % IV SOLN
INTRAVENOUS | Status: DC
  Administered 2016-05-22 – 2016-05-26 (×5): via INTRAVENOUS

## 2016-05-22 MED ORDER — METFORMIN HCL 500 MG PO TABS
1000.0000 mg | ORAL_TABLET | Freq: Two times a day (BID) | ORAL | Status: DC
  Administered 2016-05-22 – 2016-05-26 (×9): 1000 mg via ORAL
  Filled 2016-05-22 (×9): qty 2

## 2016-05-22 MED ORDER — TAMSULOSIN HCL 0.4 MG PO CAPS
0.4000 mg | ORAL_CAPSULE | Freq: Every day | ORAL | Status: DC
  Administered 2016-05-22 – 2016-05-27 (×6): 0.4 mg via ORAL
  Filled 2016-05-22 (×6): qty 1

## 2016-05-22 MED ORDER — CEPHALEXIN 500 MG PO CAPS
500.0000 mg | ORAL_CAPSULE | Freq: Three times a day (TID) | ORAL | Status: DC
  Administered 2016-05-22 – 2016-05-27 (×15): 500 mg via ORAL
  Filled 2016-05-22 (×15): qty 1

## 2016-05-22 MED ORDER — HYDROMORPHONE HCL 2 MG PO TABS
2.0000 mg | ORAL_TABLET | ORAL | Status: DC | PRN
  Administered 2016-05-22 (×2): 2 mg via ORAL
  Administered 2016-05-22 – 2016-05-25 (×11): 4 mg via ORAL
  Administered 2016-05-25: 2 mg via ORAL
  Administered 2016-05-26 (×2): 4 mg via ORAL
  Filled 2016-05-22: qty 2
  Filled 2016-05-22: qty 1
  Filled 2016-05-22 (×6): qty 2
  Filled 2016-05-22: qty 1
  Filled 2016-05-22 (×2): qty 2
  Filled 2016-05-22 (×2): qty 1
  Filled 2016-05-22: qty 2
  Filled 2016-05-22: qty 1
  Filled 2016-05-22 (×2): qty 2

## 2016-05-22 MED ORDER — INSULIN ASPART 100 UNIT/ML ~~LOC~~ SOLN
0.0000 [IU] | Freq: Three times a day (TID) | SUBCUTANEOUS | Status: DC
  Administered 2016-05-22 (×2): 3 [IU] via SUBCUTANEOUS
  Administered 2016-05-23: 5 [IU] via SUBCUTANEOUS
  Administered 2016-05-24: 2 [IU] via SUBCUTANEOUS
  Administered 2016-05-25 (×2): 3 [IU] via SUBCUTANEOUS
  Administered 2016-05-26: 5 [IU] via SUBCUTANEOUS
  Filled 2016-05-22: qty 5
  Filled 2016-05-22: qty 2
  Filled 2016-05-22 (×4): qty 3
  Filled 2016-05-22: qty 5

## 2016-05-22 MED ORDER — MORPHINE SULFATE 2 MG/ML IV SOLN
INTRAVENOUS | Status: DC
  Administered 2016-05-22: 01:00:00 via INTRAVENOUS
  Administered 2016-05-22: 7.5 mg via INTRAVENOUS
  Administered 2016-05-22 (×2): 3 mg via INTRAVENOUS
  Filled 2016-05-22: qty 25

## 2016-05-22 MED ORDER — PAROXETINE HCL 30 MG PO TABS
30.0000 mg | ORAL_TABLET | Freq: Every day | ORAL | Status: DC
  Administered 2016-05-22 – 2016-05-26 (×5): 30 mg via ORAL
  Filled 2016-05-22 (×2): qty 1
  Filled 2016-05-22: qty 1.5
  Filled 2016-05-22 (×2): qty 1

## 2016-05-22 NOTE — ED Notes (Signed)
Pt resting quietly, no distress noted, cont to monitor 

## 2016-05-22 NOTE — Progress Notes (Signed)
1 Day Post-Op Subjective: Eating lunch. Pain seems adequately controlled with PCA. No nausea or vomiting.  Objective: Vital signs in last 24 hours: Temp:  [97.5 F (36.4 C)-98.2 F (36.8 C)] 97.9 F (36.6 C) (12/04 1257) Pulse Rate:  [68-113] 73 (12/04 1257) Resp:  [9-20] 18 (12/04 1257) BP: (112-149)/(77-113) 112/84 (12/04 1257) SpO2:  [92 %-99 %] 94 % (12/04 1257) Weight:  [213 lb (96.6 kg)] 213 lb (96.6 kg) (12/03 1625)  Intake/Output from previous day: 12/03 0701 - 12/04 0700 In: 90.5 [I.V.:90.5] Out: 535 [Urine:535] Intake/Output this shift: Total I/O In: 734 [P.O.:600; I.V.:134] Out: -   Physical Exam:  General: Alert and oriented.  Mother bedside. Lungs: No increased work of breathing or respiratory distress. GI: Soft, Nondistended. Incisions: Clean and dry. Urine: Light pink, Foley in place Extremities: Nontender, no erythema, no edema.  Lab Results:  Recent Labs  05/21/16 1631  HGB 16.2  HCT 46.4          Recent Labs  05/21/16 1631 05/22/16 0458  CREATININE 1.39* 1.63*           Results for orders placed or performed during the hospital encounter of 05/21/16 (from the past 24 hour(s))  Basic metabolic panel     Status: Abnormal   Collection Time: 05/21/16  4:31 PM  Result Value Ref Range   Sodium 136 135 - 145 mmol/L   Potassium 3.5 3.5 - 5.1 mmol/L   Chloride 105 101 - 111 mmol/L   CO2 22 22 - 32 mmol/L   Glucose, Bld 215 (H) 65 - 99 mg/dL   BUN 19 6 - 20 mg/dL   Creatinine, Ser 9.561.39 (H) 0.61 - 1.24 mg/dL   Calcium 9.4 8.9 - 21.310.3 mg/dL   GFR calc non Af Amer 60 (L) >60 mL/min   GFR calc Af Amer >60 >60 mL/min   Anion gap 9 5 - 15  CBC with Differential     Status: Abnormal   Collection Time: 05/21/16  4:31 PM  Result Value Ref Range   WBC 11.9 (H) 3.8 - 10.6 K/uL   RBC 5.25 4.40 - 5.90 MIL/uL   Hemoglobin 16.2 13.0 - 18.0 g/dL   HCT 08.646.4 57.840.0 - 46.952.0 %   MCV 88.3 80.0 - 100.0 fL   MCH 30.8 26.0 - 34.0 pg   MCHC 34.9 32.0 - 36.0 g/dL    RDW 62.912.4 52.811.5 - 41.314.5 %   Platelets 223 150 - 440 K/uL   Neutrophils Relative % 63 %   Neutro Abs 7.5 (H) 1.4 - 6.5 K/uL   Lymphocytes Relative 25 %   Lymphs Abs 3.0 1.0 - 3.6 K/uL   Monocytes Relative 9 %   Monocytes Absolute 1.0 0.2 - 1.0 K/uL   Eosinophils Relative 2 %   Eosinophils Absolute 0.2 0 - 0.7 K/uL   Basophils Relative 1 %   Basophils Absolute 0.1 0 - 0.1 K/uL  Urinalysis complete, with microscopic     Status: Abnormal   Collection Time: 05/21/16  4:31 PM  Result Value Ref Range   Color, Urine YELLOW (A) YELLOW   APPearance HAZY (A) CLEAR   Glucose, UA >500 (A) NEGATIVE mg/dL   Bilirubin Urine NEGATIVE NEGATIVE   Ketones, ur TRACE (A) NEGATIVE mg/dL   Specific Gravity, Urine 1.026 1.005 - 1.030   Hgb urine dipstick 3+ (A) NEGATIVE   pH 6.0 5.0 - 8.0   Protein, ur 100 (A) NEGATIVE mg/dL   Nitrite NEGATIVE NEGATIVE   Leukocytes, UA  TRACE (A) NEGATIVE   RBC / HPF TOO NUMEROUS TO COUNT 0 - 5 RBC/hpf   WBC, UA 6-30 0 - 5 WBC/hpf   Bacteria, UA RARE (A) NONE SEEN   Squamous Epithelial / LPF NONE SEEN NONE SEEN   Mucous PRESENT    Budding Yeast PRESENT   Ethanol     Status: None   Collection Time: 05/21/16  4:31 PM  Result Value Ref Range   Alcohol, Ethyl (B) <5 <5 mg/dL  Urine Drug Screen, Qualitative     Status: Abnormal   Collection Time: 05/21/16  4:31 PM  Result Value Ref Range   Tricyclic, Ur Screen NONE DETECTED NONE DETECTED   Amphetamines, Ur Screen NONE DETECTED NONE DETECTED   MDMA (Ecstasy)Ur Screen NONE DETECTED NONE DETECTED   Cocaine Metabolite,Ur [place] POSITIVE (A) NONE DETECTED   Opiate, Ur Screen NONE DETECTED NONE DETECTED   Phencyclidine (PCP) Ur S NONE DETECTED NONE DETECTED   Cannabinoid 50 Ng, Ur [place] POSITIVE (A) NONE DETECTED   Barbiturates, Ur Screen NONE DETECTED NONE DETECTED   Benzodiazepine, Ur Scrn NONE DETECTED NONE DETECTED   Methadone Scn, Ur NONE DETECTED NONE DETECTED  Comprehensive metabolic panel     Status: Abnormal    Collection Time: 05/22/16  4:58 AM  Result Value Ref Range   Sodium 138 135 - 145 mmol/L   Potassium 3.6 3.5 - 5.1 mmol/L   Chloride 107 101 - 111 mmol/L   CO2 27 22 - 32 mmol/L   Glucose, Bld 183 (H) 65 - 99 mg/dL   BUN 19 6 - 20 mg/dL   Creatinine, Ser 4.011.63 (H) 0.61 - 1.24 mg/dL   Calcium 8.3 (L) 8.9 - 10.3 mg/dL   Total Protein 5.3 (L) 6.5 - 8.1 g/dL   Albumin 3.1 (L) 3.5 - 5.0 g/dL   AST 17 15 - 41 U/L   ALT 14 (L) 17 - 63 U/L   Alkaline Phosphatase 61 38 - 126 U/L   Total Bilirubin 0.3 0.3 - 1.2 mg/dL   GFR calc non Af Amer 49 (L) >60 mL/min   GFR calc Af Amer 57 (L) >60 mL/min   Anion gap 4 (L) 5 - 15  Urine Drug Screen, Qualitative ([facility] only)     Status: Abnormal   Collection Time: 05/22/16  6:50 AM  Result Value Ref Range   Tricyclic, Ur Screen NONE DETECTED NONE DETECTED   Amphetamines, Ur Screen NONE DETECTED NONE DETECTED   MDMA (Ecstasy)Ur Screen NONE DETECTED NONE DETECTED   Cocaine Metabolite,Ur [place] POSITIVE (A) NONE DETECTED   Opiate, Ur Screen POSITIVE (A) NONE DETECTED   Phencyclidine (PCP) Ur S NONE DETECTED NONE DETECTED   Cannabinoid 50 Ng, Ur  NONE DETECTED NONE DETECTED   Barbiturates, Ur Screen NONE DETECTED NONE DETECTED   Benzodiazepine, Ur Scrn NONE DETECTED NONE DETECTED   Methadone Scn, Ur NONE DETECTED NONE DETECTED  Glucose, capillary     Status: Abnormal   Collection Time: 05/22/16  7:46 AM  Result Value Ref Range   Glucose-Capillary 155 (H) 65 - 99 mg/dL   Comment 1 Notify RN   Glucose, capillary     Status: Abnormal   Collection Time: 05/22/16 11:31 AM  Result Value Ref Range   Glucose-Capillary 156 (H) 65 - 99 mg/dL   Comment 1 Notify RN     Assessment/Plan: 45 year old male admitted with left ureteral stent occlusion and poorly controlled pain. Urinalysis consistent with indwelling ureteral stent, urine culture pending. Remains afebrile and hemodynamically  stable.  Due to his cocaine positive status, we will unable to exchange his  stent or performed ureteroscopy until his urine drug screen is negative. Case discussed again today with Dr. Karlton Lemon from anesthesia  -d/c PCA, will transition to PO meds -continue PO abx until UCx returns -Nothing by mouth midnight, we will assess possibility of going to the OR tomorrow as an add-on case pending urine drug screen in the morning -Patient understands the importance of avoiding illicit drugs in the interim -To proceed to the operating room emergently should the patient shows signs or symptoms of sepsis   LOS: 1 day   Vanna Scotland 05/22/2016, 3:25 PM

## 2016-05-22 NOTE — ED Notes (Signed)
Transporting pt to room 226 in the stretcher.

## 2016-05-22 NOTE — ED Notes (Signed)
Pt resting quietly, asking for additional sprite to drink, no outward signs of pain noted, cont to monitor

## 2016-05-22 NOTE — ED Notes (Signed)
Pt resting quietly, cont to monitor °

## 2016-05-22 NOTE — ED Notes (Signed)
[place] sandwich tray given to pt, no distress noted, cont to monitor

## 2016-05-22 NOTE — ED Notes (Signed)
Pt resting quietly no distress noted, cont to monitor 

## 2016-05-22 NOTE — Progress Notes (Signed)
Initial Nutrition Assessment  DOCUMENTATION CODES:   Obesity unspecified  INTERVENTION:   Glucerna Shake po daily, each supplement provides 220 kcal and 10 grams of protein   NUTRITION DIAGNOSIS:   Unintentional weight loss related to poor appetite as evidenced by per patient/family report, percent weight loss.  GOAL:   Patient will meet greater than or equal to 90% of their needs  MONITOR:   PO intake  REASON FOR ASSESSMENT:   Malnutrition Screening Tool    ASSESSMENT:   45 y.o. male with h/o HTN and IDDM who presents for AKI secondary to obstructed Left Ureteral Stent with Obstructing Left Ureteral Stone and Refractory Colic; pt admitted for pain control; Pt is unable to have anesthesia at this point r/t positive urine cocaine test    Met with pt in room today. Pt reports that he is feeling better today but is still in some pain. Pt reports that his appetite is good; ate 100% of breakfast. Pt reports poor appetite for a couple months pta but especially for the past 3 weeks and reports that he has lost weight r/t poor appetite and PO intake. Per chart, pt has lost 21lbs(9%) over the past 6 months. This would be considered significant when considering pts report of poor appetite and decreased PO intake. Pts CBGs have been elevated during this admit. Last AIC from 09/2015 was 16.       Medications reviewed and include: keflex, insulin, metformin, morphine, paxil   Labs reviewed: creat 1.63(H), Ca 8.3(L) adj. 9.02 wnl, Alb 3.1(L), ALT 14(L), TP 5.3(L) WBC 11.9(H)  BG- 215,183 x 24hrs AIC- 16(H) 09/26/15  Nutrition-Focused physical exam completed. Findings are no fat depletion, no muscle depletion, and no edema.   Diet Order:  Diet Carb Modified Fluid consistency: Thin; Room service appropriate? Yes  Skin:  Wound (see comment) (incision groin)  Last BM:  none since pta  Height:   Ht Readings from Last 1 Encounters:  05/21/16 5' 6"  (1.676 m)    Weight:   Wt Readings  from Last 1 Encounters:  05/21/16 213 lb (96.6 kg)    Ideal Body Weight:  64.5 kg  BMI:  Body mass index is 34.38 kg/m.  Estimated Nutritional Needs:   Kcal:  1750-2100kcal/day   Protein:  97-116g/day   Fluid:  2L/day   EDUCATION NEEDS:   No education needs identified at this time  Koleen Distance, RD, LDN

## 2016-05-22 NOTE — H&P (Signed)
Please refer to the Urology Consultation Report dated 05/21/2016 at 11:31pm.

## 2016-05-22 NOTE — ED Notes (Signed)
Report given to dawn for 226

## 2016-05-23 LAB — CBC
HCT: 39.7 % — ABNORMAL LOW (ref 40.0–52.0)
Hemoglobin: 13.9 g/dL (ref 13.0–18.0)
MCH: 31.3 pg (ref 26.0–34.0)
MCHC: 35 g/dL (ref 32.0–36.0)
MCV: 89.3 fL (ref 80.0–100.0)
PLATELETS: 180 10*3/uL (ref 150–440)
RBC: 4.45 MIL/uL (ref 4.40–5.90)
RDW: 12.6 % (ref 11.5–14.5)
WBC: 6.5 10*3/uL (ref 3.8–10.6)

## 2016-05-23 LAB — BASIC METABOLIC PANEL
ANION GAP: 6 (ref 5–15)
BUN: 15 mg/dL (ref 6–20)
CALCIUM: 8.2 mg/dL — AB (ref 8.9–10.3)
CO2: 26 mmol/L (ref 22–32)
Chloride: 104 mmol/L (ref 101–111)
Creatinine, Ser: 1.57 mg/dL — ABNORMAL HIGH (ref 0.61–1.24)
GFR, EST AFRICAN AMERICAN: 60 mL/min — AB (ref >60)
GFR, EST NON AFRICAN AMERICAN: 52 mL/min — AB (ref >60)
GLUCOSE: 123 mg/dL — AB (ref 65–99)
Potassium: 3.4 mmol/L — ABNORMAL LOW (ref 3.5–5.1)
Sodium: 136 mmol/L (ref 135–145)

## 2016-05-23 LAB — URINE DRUG SCREEN, QUALITATIVE (ARMC ONLY)
Amphetamines, Ur Screen: NOT DETECTED
BENZODIAZEPINE, UR SCRN: NOT DETECTED
Barbiturates, Ur Screen: NOT DETECTED
CANNABINOID 50 NG, UR ~~LOC~~: NOT DETECTED
Cocaine Metabolite,Ur ~~LOC~~: POSITIVE — AB
MDMA (Ecstasy)Ur Screen: NOT DETECTED
Methadone Scn, Ur: NOT DETECTED
Opiate, Ur Screen: POSITIVE — AB
PHENCYCLIDINE (PCP) UR S: NOT DETECTED
Tricyclic, Ur Screen: NOT DETECTED

## 2016-05-23 LAB — GLUCOSE, CAPILLARY
GLUCOSE-CAPILLARY: 222 mg/dL — AB (ref 65–99)
GLUCOSE-CAPILLARY: 105 mg/dL — AB (ref 65–99)
Glucose-Capillary: 120 mg/dL — ABNORMAL HIGH (ref 65–99)
Glucose-Capillary: 138 mg/dL — ABNORMAL HIGH (ref 65–99)

## 2016-05-23 NOTE — Progress Notes (Signed)
Foley discontinued as ordered 400 ml of clear yellow urine obtained

## 2016-05-23 NOTE — Progress Notes (Signed)
2 Days Post-Op Subjective: Drug screen cocaine positive again this morning.  Pain seems adequately controlled with PCA. No nausea or vomiting.  VSS afebrile.  No leukocytosis.    Objective: Vital signs in last 24 hours: Temp:  [97.9 F (36.6 C)-98 F (36.7 C)] 97.9 F (36.6 C) (12/05 0511) Pulse Rate:  [65-73] 71 (12/05 0511) Resp:  [9-18] 17 (12/05 0511) BP: (105-119)/(67-84) 105/67 (12/05 0511) SpO2:  [92 %-96 %] 92 % (12/05 0511)  Intake/Output from previous day: 12/04 0701 - 12/05 0700 In: 1568 [P.O.:960; I.V.:608] Out: 1000 [Urine:1000] Intake/Output this shift: No intake/output data recorded.  Physical Exam:  General: Alert and oriented.  Lungs: No increased work of breathing or respiratory distress. GI: Soft, Nondistended. Incisions: Clean and dry. Urine: Light pink, Foley in place Extremities: Nontender, no erythema, no edema.  Lab Results:  Recent Labs  05/21/16 1631 05/23/16 0539  HGB 16.2 13.9  HCT 46.4 39.7*           Recent Labs  05/21/16 1631 05/22/16 0458 05/23/16 0539  CREATININE 1.39* 1.63* 1.57*           Results for orders placed or performed during the hospital encounter of 05/21/16 (from the past 24 hour(s))  Glucose, capillary     Status: Abnormal   Collection Time: 05/22/16  7:46 AM  Result Value Ref Range   Glucose-Capillary 155 (H) 65 - 99 mg/dL   Comment 1 Notify RN   Glucose, capillary     Status: Abnormal   Collection Time: 05/22/16 11:31 AM  Result Value Ref Range   Glucose-Capillary 156 (H) 65 - 99 mg/dL   Comment 1 Notify RN   Glucose, capillary     Status: None   Collection Time: 05/22/16  4:27 PM  Result Value Ref Range   Glucose-Capillary 81 65 - 99 mg/dL  Glucose, capillary     Status: Abnormal   Collection Time: 05/22/16  9:34 PM  Result Value Ref Range   Glucose-Capillary 134 (H) 65 - 99 mg/dL  Urine Drug Screen, Qualitative ([facility] only)     Status: Abnormal   Collection Time: 05/23/16  5:29 AM  Result Value  Ref Range   Tricyclic, Ur Screen NONE DETECTED NONE DETECTED   Amphetamines, Ur Screen NONE DETECTED NONE DETECTED   MDMA (Ecstasy)Ur Screen NONE DETECTED NONE DETECTED   Cocaine Metabolite,Ur [place] POSITIVE (A) NONE DETECTED   Opiate, Ur Screen POSITIVE (A) NONE DETECTED   Phencyclidine (PCP) Ur S NONE DETECTED NONE DETECTED   Cannabinoid 50 Ng, Ur [place] NONE DETECTED NONE DETECTED   Barbiturates, Ur Screen NONE DETECTED NONE DETECTED   Benzodiazepine, Ur Scrn NONE DETECTED NONE DETECTED   Methadone Scn, Ur NONE DETECTED NONE DETECTED  CBC     Status: Abnormal   Collection Time: 05/23/16  5:39 AM  Result Value Ref Range   WBC 6.5 3.8 - 10.6 K/uL   RBC 4.45 4.40 - 5.90 MIL/uL   Hemoglobin 13.9 13.0 - 18.0 g/dL   HCT 95.139.7 (L) 88.440.0 - 16.652.0 %   MCV 89.3 80.0 - 100.0 fL   MCH 31.3 26.0 - 34.0 pg   MCHC 35.0 32.0 - 36.0 g/dL   RDW 06.312.6 01.611.5 - 01.014.5 %   Platelets 180 150 - 440 K/uL  Basic metabolic panel     Status: Abnormal   Collection Time: 05/23/16  5:39 AM  Result Value Ref Range   Sodium 136 135 - 145 mmol/L   Potassium 3.4 (L) 3.5 - 5.1  mmol/L   Chloride 104 101 - 111 mmol/L   CO2 26 22 - 32 mmol/L   Glucose, Bld 123 (H) 65 - 99 mg/dL   BUN 15 6 - 20 mg/dL   Creatinine, Ser 2.951.57 (H) 0.61 - 1.24 mg/dL   Calcium 8.2 (L) 8.9 - 10.3 mg/dL   GFR calc non Af Amer 52 (L) >60 mL/min   GFR calc Af Amer 60 (L) >60 mL/min   Anion gap 6 5 - 15    Assessment/Plan: 45 year old male admitted with left ureteral stent occlusion and poorly controlled pain. Urinalysis consistent with indwelling ureteral stent, urine culture pending. Remains afebrile and hemodynamically stable.  Due to his cocaine positive status, we will unable to exchange his stent or performed ureteroscopy until his urine drug screen is negative. Case discussed again today with Dr. Karlton LemonKarenz from anesthesia  -d/c PCA, will transition to PO meds -continue PO abx until UCx returns - still pending -Nothing by mouth midnight, we will  assess possibility of going to the OR tomorrow as an add-on case pending urine drug screen in the morning - will discuss further with anesthesia as his drug screen continues to be positive and patient needs ureteroscopy to prevent serious sequelae -Patient understands the importance of avoiding illicit drugs in the interim - reinforced with patient again today and explained the risks of drug use and his condition -To proceed to the operating room emergently should the patient shows signs or symptoms of sepsis   LOS: 2 days   [facility] Hospital[facility]ANNON Brecken Williamson 05/23/2016, 7:33 AM

## 2016-05-24 ENCOUNTER — Ambulatory Visit: Admit: 2016-05-24 | Payer: Self-pay | Admitting: Urology

## 2016-05-24 ENCOUNTER — Encounter
Admission: EM | Disposition: A | Discharge status: Home / Self Care | Payer: Self-pay | Source: Home / Self Care | Attending: Urology

## 2016-05-24 LAB — URINE DRUG SCREEN, QUALITATIVE (ARMC ONLY)
AMPHETAMINES, UR SCREEN: NOT DETECTED
BENZODIAZEPINE, UR SCRN: NOT DETECTED
Barbiturates, Ur Screen: NOT DETECTED
Cannabinoid 50 Ng, Ur ~~LOC~~: POSITIVE — AB
Cocaine Metabolite,Ur ~~LOC~~: POSITIVE — AB
MDMA (Ecstasy)Ur Screen: NOT DETECTED
Methadone Scn, Ur: NOT DETECTED
OPIATE, UR SCREEN: POSITIVE — AB
PHENCYCLIDINE (PCP) UR S: NOT DETECTED
Tricyclic, Ur Screen: NOT DETECTED

## 2016-05-24 LAB — GLUCOSE, CAPILLARY
GLUCOSE-CAPILLARY: 131 mg/dL — AB (ref 65–99)
GLUCOSE-CAPILLARY: 99 mg/dL (ref 65–99)
Glucose-Capillary: 103 mg/dL — ABNORMAL HIGH (ref 65–99)
Glucose-Capillary: 149 mg/dL — ABNORMAL HIGH (ref 65–99)

## 2016-05-24 SURGERY — URETEROSCOPY, WITH LITHOTRIPSY USING HOLMIUM LASER
Anesthesia: Choice | Laterality: Left

## 2016-05-24 MED ORDER — CEFAZOLIN SODIUM-DEXTROSE 2-4 GM/100ML-% IV SOLN
2.0000 g | INTRAVENOUS | Status: AC
  Administered 2016-05-27: 2 g via INTRAVENOUS
  Filled 2016-05-24 (×2): qty 100

## 2016-05-24 NOTE — Progress Notes (Signed)
Patient drug screen positive for cocaine and marijuana this AM, Dr. Apolinar JunesBrandon notified and surgery cancelled today, received orders to resume diet, sitter at the bedside, patient may not leave the floor.

## 2016-05-24 NOTE — Progress Notes (Signed)
05/24/16  Subjective: Urine drug screen positive this morning for both cocaine and marijuana, not previously positive for marijuana. Patient adamantly denies that he used any drugs while in the hospital. Pain seems appropriately controlled on oral pain medication.  Objective: Vital signs in last 24 hours: Temp:  [97.7 F (36.5 C)-98.1 F (36.7 C)] 98 F (36.7 C) (12/06 0824) Pulse Rate:  [64-80] 66 (12/06 0824) Resp:  [17-18] 18 (12/06 0617) BP: (103-116)/(65-70) 116/65 (12/06 0824) SpO2:  [95 %-97 %] 95 % (12/06 0824)  Intake/Output from previous day: 12/05 0701 - 12/06 0700 In: 1862 [P.O.:960; I.V.:902] Out: 250 [Urine:250] Intake/Output this shift: No intake/output data recorded.  Physical Exam:  General: Alert and oriented.   Lungs: No increased work of breathing or respiratory distress. GI: Soft, Nondistended. Incisions: Clean and dry. Extremities: Nontender, no erythema, no edema.  Lab Results:  Recent Labs  05/21/16 1631 05/23/16 0539  HGB 16.2 13.9  HCT 46.4 39.7*           Recent Labs  05/21/16 1631 05/22/16 0458 05/23/16 0539  CREATININE 1.39* 1.63* 1.57*           Results for orders placed or performed during the hospital encounter of 05/21/16 (from the past 24 hour(s))  Glucose, capillary     Status: Abnormal   Collection Time: 05/23/16 11:49 AM  Result Value Ref Range   Glucose-Capillary 222 (H) 65 - 99 mg/dL   Comment 1 Notify RN   Glucose, capillary     Status: Abnormal   Collection Time: 05/23/16  4:47 PM  Result Value Ref Range   Glucose-Capillary 105 (H) 65 - 99 mg/dL   Comment 1 Notify RN   Glucose, capillary     Status: Abnormal   Collection Time: 05/23/16  8:37 PM  Result Value Ref Range   Glucose-Capillary 138 (H) 65 - 99 mg/dL   Comment 1 Notify RN   Urine Drug Screen, Qualitative ([facility] only)     Status: Abnormal   Collection Time: 05/24/16  6:29 AM  Result Value Ref Range   Tricyclic, Ur Screen NONE DETECTED NONE DETECTED   Amphetamines, Ur Screen NONE DETECTED NONE DETECTED   MDMA (Ecstasy)Ur Screen NONE DETECTED NONE DETECTED   Cocaine Metabolite,Ur [place] POSITIVE (A) NONE DETECTED   Opiate, Ur Screen POSITIVE (A) NONE DETECTED   Phencyclidine (PCP) Ur S NONE DETECTED NONE DETECTED   Cannabinoid 50 Ng, Ur [place] POSITIVE (A) NONE DETECTED   Barbiturates, Ur Screen NONE DETECTED NONE DETECTED   Benzodiazepine, Ur Scrn NONE DETECTED NONE DETECTED   Methadone Scn, Ur NONE DETECTED NONE DETECTED  Glucose, capillary     Status: Abnormal   Collection Time: 05/24/16  7:33 AM  Result Value Ref Range   Glucose-Capillary 103 (H) 65 - 99 mg/dL   Comment 1 Notify RN     Assessment/Plan: 45 year old male admitted with left ureteral stent occlusion and poorly controlled pain. Remains afebrile and hemodynamically stable.   Due to his cocaine positive status, we will unable to exchange his stent or performed ureteroscopy until his urine drug screen is negative. Case discussed again today with Dr. Karlton LemonKarenz from anesthesia (again).  Would likely fail outpatient management.  -continue PO abx  -Nothing by mouth midnight, we will assess possibility of going to the OR tomorrow as an add-on case pending urine drug screen in the morning AGAIN -Patient understands the importance of avoiding illicit drugs in the interim -sitter today, moved rooms to in front of nursing station,  -  To proceed to the operating room emergently should the patient shows signs or symptoms of sepsis   LOS: 3 days   Vanna Scotlandshley Brandalynn Ofallon 05/24/2016, 8:28 AM

## 2016-05-24 NOTE — Progress Notes (Signed)
Notified dr.brandon of pt fs of 99 and orders for 24 units of insulin scheduled tonight and NPO at midnight. Acknowledged and orders to give.

## 2016-05-25 LAB — URINE DRUG SCREEN, QUALITATIVE (ARMC ONLY)
AMPHETAMINES, UR SCREEN: NOT DETECTED
BARBITURATES, UR SCREEN: NOT DETECTED
BENZODIAZEPINE, UR SCRN: NOT DETECTED
CANNABINOID 50 NG, UR ~~LOC~~: NOT DETECTED
Cocaine Metabolite,Ur ~~LOC~~: POSITIVE — AB
MDMA (Ecstasy)Ur Screen: NOT DETECTED
Methadone Scn, Ur: NOT DETECTED
OPIATE, UR SCREEN: POSITIVE — AB
PHENCYCLIDINE (PCP) UR S: NOT DETECTED
Tricyclic, Ur Screen: NOT DETECTED

## 2016-05-25 LAB — GLUCOSE, CAPILLARY
GLUCOSE-CAPILLARY: 159 mg/dL — AB (ref 65–99)
Glucose-Capillary: 108 mg/dL — ABNORMAL HIGH (ref 65–99)
Glucose-Capillary: 185 mg/dL — ABNORMAL HIGH (ref 65–99)
Glucose-Capillary: 138 mg/dL — ABNORMAL HIGH (ref 65–99)

## 2016-05-25 NOTE — Progress Notes (Signed)
05/25/16  Subjective: Urine drug screen still positive for cocaine. Patient adamantly denies using it since prior to admission.  Currently situated in front of the nurse's station, Publishing copydigital sitter present.  Mr. Dustin Williamson states that he does not think he would be able to abstain for cocaine to have surgery as an outpatient. Pain adequately controlled.  Objective: Vital signs in last 24 hours: Temp:  [97.5 F (36.4 C)-98.2 F (36.8 C)] 97.5 F (36.4 C) (12/07 0415) Pulse Rate:  [57-69] 57 (12/07 0415) Resp:  [17-18] 17 (12/07 0415) BP: (127-136)/(81-96) 127/81 (12/07 0415) SpO2:  [94 %-97 %] 94 % (12/07 0415)  Intake/Output from previous day: 12/06 0701 - 12/07 0700 In: 1343 [P.O.:840; I.V.:503] Out: 2050 [Urine:2050] Intake/Output this shift: No intake/output data recorded.  Physical Exam:  General: Alert and oriented.   Lungs: No increased work of breathing or respiratory distress. GI: Soft, Nondistended. Incisions: Clean and dry. Extremities: Nontender, no erythema, no edema.  Lab Results:  Recent Labs  05/23/16 0539  HGB 13.9  HCT 39.7*           Recent Labs  05/21/16 1631 05/22/16 0458 05/23/16 0539  CREATININE 1.39* 1.63* 1.57*           Results for orders placed or performed during the hospital encounter of 05/21/16 (from the past 24 hour(s))  Glucose, capillary     Status: Abnormal   Collection Time: 05/24/16 11:28 AM  Result Value Ref Range   Glucose-Capillary 149 (H) 65 - 99 mg/dL   Comment 1 Notify RN   Glucose, capillary     Status: Abnormal   Collection Time: 05/24/16  4:27 PM  Result Value Ref Range   Glucose-Capillary 131 (H) 65 - 99 mg/dL   Comment 1 Notify RN   Glucose, capillary     Status: None   Collection Time: 05/24/16  8:51 PM  Result Value Ref Range   Glucose-Capillary 99 65 - 99 mg/dL  Urine Drug Screen, Qualitative ([facility] only)     Status: Abnormal   Collection Time: 05/25/16  4:05 AM  Result Value Ref Range   Tricyclic, Ur Screen  NONE DETECTED NONE DETECTED   Amphetamines, Ur Screen NONE DETECTED NONE DETECTED   MDMA (Ecstasy)Ur Screen NONE DETECTED NONE DETECTED   Cocaine Metabolite,Ur [place] POSITIVE (A) NONE DETECTED   Opiate, Ur Screen POSITIVE (A) NONE DETECTED   Phencyclidine (PCP) Ur S NONE DETECTED NONE DETECTED   Cannabinoid 50 Ng, Ur [place] NONE DETECTED NONE DETECTED   Barbiturates, Ur Screen NONE DETECTED NONE DETECTED   Benzodiazepine, Ur Scrn NONE DETECTED NONE DETECTED   Methadone Scn, Ur NONE DETECTED NONE DETECTED  Glucose, capillary     Status: Abnormal   Collection Time: 05/25/16  7:33 AM  Result Value Ref Range   Glucose-Capillary 108 (H) 65 - 99 mg/dL    Assessment/Plan: 45 year old male admitted with left ureteral stent occlusion and poorly controlled pain. Remains afebrile and hemodynamically stable.   Due to his cocaine positive status, we will unable to exchange his stent or performed ureteroscopy until his urine drug screen is negative.  Would likely fail outpatient management.  -continue PO abx   -Nothing by mouth midnight, we will assess possibility of going to the OR tomorrow as an add-on case pending urine drug screen in the morning AGAIN -Patient understands the importance of avoiding illicit drugs in the interim -continue sitter, patient to remain on urit,  -To proceed to the operating room emergently should the patient shows signs  or symptoms of sepsis   LOS: 4 days   Vanna Scotlandshley Micajah Dennin 05/25/2016, 8:54 AM

## 2016-05-26 ENCOUNTER — Inpatient Hospital Stay: Admission: RE | Admit: 2016-05-26 | Payer: Self-pay | Source: Ambulatory Visit

## 2016-05-26 DIAGNOSIS — N201 Calculus of ureter: Secondary | ICD-10-CM

## 2016-05-26 LAB — URINE DRUG SCREEN, QUALITATIVE (ARMC ONLY)
Amphetamines, Ur Screen: NOT DETECTED
BENZODIAZEPINE, UR SCRN: NOT DETECTED
Barbiturates, Ur Screen: NOT DETECTED
CANNABINOID 50 NG, UR ~~LOC~~: NOT DETECTED
Cocaine Metabolite,Ur ~~LOC~~: POSITIVE — AB
MDMA (Ecstasy)Ur Screen: NOT DETECTED
Methadone Scn, Ur: NOT DETECTED
Opiate, Ur Screen: POSITIVE — AB
PHENCYCLIDINE (PCP) UR S: NOT DETECTED
Tricyclic, Ur Screen: NOT DETECTED

## 2016-05-26 LAB — GLUCOSE, CAPILLARY
GLUCOSE-CAPILLARY: 210 mg/dL — AB (ref 65–99)
GLUCOSE-CAPILLARY: 68 mg/dL (ref 65–99)
Glucose-Capillary: 98 mg/dL (ref 65–99)
Glucose-Capillary: 91 mg/dL (ref 65–99)

## 2016-05-26 MED ORDER — INSULIN GLARGINE 100 UNIT/ML ~~LOC~~ SOLN
12.0000 [IU] | Freq: Once | SUBCUTANEOUS | Status: AC
  Administered 2016-05-26: 12 [IU] via SUBCUTANEOUS
  Filled 2016-05-26: qty 0.12

## 2016-05-26 NOTE — Progress Notes (Signed)
The Nurse in the unit asked CH to visit the Pt in [id number]. CH visited the Pt, Pt was lying in the Bed in pain, CH spoke to Pt briefly, Pt asked CH to come back later, but also requested CH to call the Nurse; [facility] and asked the Pt's Nurse to check on the Pt.     05/26/16 1600  Clinical Encounter Type  Visited With Patient  Visit Type Initial  Referral From Nurse  Consult/Referral To Chaplain  Spiritual Encounters  Spiritual Needs Prayer;Other (Comment)

## 2016-05-26 NOTE — Progress Notes (Signed)
Called to patients room and patient passed stone, patient states that he thought he had to use the bathroom and passed the stone instead. Patient retrieved the stone from the toliet and put it in a specimen container to show Dr. Apolinar JunesBrandon in the morning.

## 2016-05-26 NOTE — Progress Notes (Signed)
05/26/16  Subjective: Urine drug screen still positive for cocaine.  Diet advanced again today.  Pain adequately controlled.  Objective: Vital signs in last 24 hours: Temp:  [97.9 F (36.6 C)-98.4 F (36.9 C)] 97.9 F (36.6 C) (12/08 0903) Pulse Rate:  [66-70] 67 (12/08 0903) Resp:  [18-20] 19 (12/08 0903) BP: (120-136)/(63-91) 133/81 (12/08 0903) SpO2:  [94 %-96 %] 94 % (12/08 0903)  Intake/Output from previous day: 12/07 0701 - 12/08 0700 In: 638.5 [P.O.:240; I.V.:398.5] Out: 900 [Urine:900] Intake/Output this shift: Total I/O In: -  Out: 350 [Urine:350]  Physical Exam:  General: Alert and oriented.   Lungs: No increased work of breathing or respiratory distress. GI: Soft, Nondistended. Incisions: Clean and dry. Extremities: Nontender, no erythema, no edema.  Lab Results: No results for input(s): HGB, HCT in the last 72 hours.         Recent Labs  05/21/16 1631 05/22/16 0458 05/23/16 0539  CREATININE 1.39* 1.63* 1.57*           Results for orders placed or performed during the hospital encounter of 05/21/16 (from the past 24 hour(s))  Glucose, capillary     Status: Abnormal   Collection Time: 05/25/16 11:31 AM  Result Value Ref Range   Glucose-Capillary 185 (H) 65 - 99 mg/dL   Comment 1 Notify RN   Glucose, capillary     Status: Abnormal   Collection Time: 05/25/16  4:40 PM  Result Value Ref Range   Glucose-Capillary 159 (H) 65 - 99 mg/dL  Glucose, capillary     Status: Abnormal   Collection Time: 05/25/16  8:57 PM  Result Value Ref Range   Glucose-Capillary 138 (H) 65 - 99 mg/dL  Urine Drug Screen, Qualitative ([facility] only)     Status: Abnormal   Collection Time: 05/26/16  1:22 AM  Result Value Ref Range   Tricyclic, Ur Screen NONE DETECTED NONE DETECTED   Amphetamines, Ur Screen NONE DETECTED NONE DETECTED   MDMA (Ecstasy)Ur Screen NONE DETECTED NONE DETECTED   Cocaine Metabolite,Ur [place] POSITIVE (A) NONE DETECTED   Opiate, Ur Screen POSITIVE (A) NONE  DETECTED   Phencyclidine (PCP) Ur S NONE DETECTED NONE DETECTED   Cannabinoid 50 Ng, Ur [place] NONE DETECTED NONE DETECTED   Barbiturates, Ur Screen NONE DETECTED NONE DETECTED   Benzodiazepine, Ur Scrn NONE DETECTED NONE DETECTED   Methadone Scn, Ur NONE DETECTED NONE DETECTED  Glucose, capillary     Status: None   Collection Time: 05/26/16  7:33 AM  Result Value Ref Range   Glucose-Capillary 98 65 - 99 mg/dL    Assessment/Plan: 45 year old male admitted with left ureteral stent occlusion and poorly controlled pain. Remains afebrile and hemodynamically stable.   Due to his cocaine positive status, we will unable to exchange his stent or performed ureteroscopy until his urine drug screen is negative.  Would likely fail outpatient management.  -continue PO abx   -Nothing by mouth midnight, we will assess possibility of going to the OR tomorrow as an add-on case pending urine drug screen in the morning AGAIN -Patient understands the importance of avoiding illicit drugs in the interim -continue sitter, patient to remain on unit -To proceed to the operating room emergently should the patient shows signs or symptoms of sepsis   LOS: 5 days   Vanna Scotlandshley Dalayla Aldredge 05/26/2016, 11:11 AM   Case was discussed again today with Dr. Orson ApePiscatello from anesthesia and Dr. Selena BattenKim (on call for urology Saturday).

## 2016-05-26 NOTE — Progress Notes (Signed)
Ok to give half dose of scheduled lantus (12 units) tonight since pt will be NPO after midnight.

## 2016-05-26 NOTE — Care Management (Signed)
Patient self pay.  ODC and Medication management placed on Chart for discharge.  RNCM following for discharge medication needs.

## 2016-05-26 NOTE — Progress Notes (Signed)
Nutrition Follow-up  DOCUMENTATION CODES:   Obesity unspecified  INTERVENTION:  -Cater to pt preferences -Continue Glucerna shake daily   NUTRITION DIAGNOSIS:   Unintentional weight loss related to poor appetite as evidenced by per patient/family report, percent weight loss.  GOAL:   Patient will meet greater than or equal to 90% of their needs  MONITOR:   PO intake  REASON FOR ASSESSMENT:   Malnutrition Screening Tool    ASSESSMENT:   45 y.o. 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 Procedure has been on hold due to drug screen Pt appetite remains good, eating 86% of meals on average  Labs and Meds reviewed  Diet Order:  Diet Carb Modified Fluid consistency: Thin; Room service appropriate? Yes  Skin:  Wound (see comment) (incision groin)  Last BM:  12/7  Height:   Ht Readings from Last 1 Encounters:  05/21/16 5\' 6"  (1.676 m)    Weight:   Wt Readings from Last 1 Encounters:  05/21/16 213 lb (96.6 kg)   Filed Weights   05/21/16 1625  Weight: 213 lb (96.6 kg)     Ideal Body Weight:  64.5 kg  BMI:  Body mass index is 34.38 kg/m.  Estimated Nutritional Needs:   Kcal:  1750-2100kcal/day   Protein:  97-116g/day   Fluid:  2L/day   EDUCATION NEEDS:   No education needs identified at this time  Dustin StarcherCate Abir Craine MS, RD, LDN [phone] Pager  [phone] Weekend/On-Call Pager

## 2016-05-27 ENCOUNTER — Encounter
Admission: EM | Disposition: A | Discharge status: Home / Self Care | Payer: Self-pay | Source: Home / Self Care | Attending: Urology

## 2016-05-27 ENCOUNTER — Inpatient Hospital Stay: Payer: Self-pay | Admitting: Registered Nurse

## 2016-05-27 HISTORY — PX: CYSTOSCOPY W/ URETERAL STENT REMOVAL: SHX1430

## 2016-05-27 LAB — URINE DRUG SCREEN, QUALITATIVE (ARMC ONLY)
AMPHETAMINES, UR SCREEN: NOT DETECTED
Barbiturates, Ur Screen: NOT DETECTED
Benzodiazepine, Ur Scrn: NOT DETECTED
COCAINE METABOLITE, UR ~~LOC~~: NOT DETECTED
Cannabinoid 50 Ng, Ur ~~LOC~~: NOT DETECTED
MDMA (ECSTASY) UR SCREEN: NOT DETECTED
METHADONE SCREEN, URINE: NOT DETECTED
OPIATE, UR SCREEN: POSITIVE — AB
Phencyclidine (PCP) Ur S: NOT DETECTED
Tricyclic, Ur Screen: NOT DETECTED

## 2016-05-27 LAB — GLUCOSE, CAPILLARY
Glucose-Capillary: 104 mg/dL — ABNORMAL HIGH (ref 65–99)
Glucose-Capillary: 113 mg/dL — ABNORMAL HIGH (ref 65–99)
Glucose-Capillary: 107 mg/dL — ABNORMAL HIGH (ref 65–99)

## 2016-05-27 SURGERY — REMOVAL, STENT, URETER, CYSTOSCOPIC
Anesthesia: General

## 2016-05-27 MED ORDER — ONDANSETRON HCL 4 MG/2ML IJ SOLN: 4.0000 mg | Freq: Once | INTRAMUSCULAR | Status: DC | PRN

## 2016-05-27 MED ORDER — FENTANYL CITRATE (PF) 100 MCG/2ML IJ SOLN: 25.0000 ug | INTRAMUSCULAR | Status: DC | PRN

## 2016-05-27 MED ORDER — LIDOCAINE HCL (CARDIAC) 20 MG/ML IV SOLN
INTRAVENOUS | Status: DC | PRN
Start: 2016-05-27 — End: 2016-05-27
  Administered 2016-05-27: 100 mg via INTRAVENOUS

## 2016-05-27 MED ORDER — ONDANSETRON HCL 4 MG/2ML IJ SOLN
INTRAMUSCULAR | Status: DC | PRN
  Administered 2016-05-27: 4 mg via INTRAVENOUS

## 2016-05-27 MED ORDER — GLYCOPYRROLATE 0.2 MG/ML IJ SOLN
INTRAMUSCULAR | Status: DC | PRN
  Administered 2016-05-27: 0.2 mg via INTRAVENOUS

## 2016-05-27 MED ORDER — MIDAZOLAM HCL 2 MG/2ML IJ SOLN
INTRAMUSCULAR | Status: DC | PRN
  Administered 2016-05-27: 2 mg via INTRAVENOUS

## 2016-05-27 MED ORDER — PROPOFOL 10 MG/ML IV BOLUS
INTRAVENOUS | Status: DC | PRN
  Administered 2016-05-27: 200 mg via INTRAVENOUS

## 2016-05-27 MED ORDER — LACTATED RINGERS IV SOLN
INTRAVENOUS | Status: DC | PRN
  Administered 2016-05-27: 16:00:00 via INTRAVENOUS

## 2016-05-27 MED ORDER — DEXAMETHASONE SODIUM PHOSPHATE 10 MG/ML IJ SOLN
INTRAMUSCULAR | Status: DC | PRN
  Administered 2016-05-27: 5 mg via INTRAVENOUS

## 2016-05-27 SURGICAL SUPPLY — 21 items
BAG DRAIN CYSTO-URO LG1000N (MISCELLANEOUS) ×3 IMPLANT
CATH FOL 2WAY LX 16X5 (CATHETERS) IMPLANT
CATH URETL 5X70 OPEN END (CATHETERS) IMPLANT
CONRAY 43 FOR UROLOGY 50M (MISCELLANEOUS) IMPLANT
GLOVE BIO SURGEON STRL SZ 6.5 (GLOVE) ×6 IMPLANT
GLOVE BIO SURGEONS STRL SZ 6.5 (GLOVE) ×3
GOWN STRL REUS W/ TWL LRG LVL4 (GOWN DISPOSABLE) ×2 IMPLANT
GOWN STRL REUS W/TWL LRG LVL4 (GOWN DISPOSABLE) ×4
HOLDER FOLEY CATH W/STRAP (MISCELLANEOUS) IMPLANT
KIT RM TURNOVER CYSTO AR (KITS) ×3 IMPLANT
PACK CYSTO AR (MISCELLANEOUS) ×3 IMPLANT
SENSORWIRE 0.038 NOT ANGLED (WIRE)
SET CYSTO W/LG BORE CLAMP LF (SET/KITS/TRAYS/PACK) ×3 IMPLANT
SOL .9 NS 3000ML IRR  AL (IV SOLUTION) ×2
SOL .9 NS 3000ML IRR UROMATIC (IV SOLUTION) ×1 IMPLANT
STENT URET 6FRX24 CONTOUR (STENTS) IMPLANT
STENT URET 6FRX26 CONTOUR (STENTS) IMPLANT
SURGILUBE 2OZ TUBE FLIPTOP (MISCELLANEOUS) ×3 IMPLANT
SYRINGE IRR TOOMEY STRL 70CC (SYRINGE) ×3 IMPLANT
WATER STERILE IRR 1000ML POUR (IV SOLUTION) ×3 IMPLANT
WIRE SENSOR 0.038 NOT ANGLED (WIRE) IMPLANT

## 2016-05-27 NOTE — Anesthesia Procedure Notes (Signed)
Procedure Name: LMA Insertion Date/Time: 05/27/2016 4:32 PM Performed by: Stormy FabianURTIS, Kannan Proia Pre-anesthesia Checklist: Patient identified, Patient being monitored, Timeout performed, Emergency Drugs available and Suction available Patient Re-evaluated:Patient Re-evaluated prior to inductionOxygen Delivery Method: Circle system utilized Preoxygenation: Pre-oxygenation with 100% oxygen Intubation Type: IV induction Ventilation: Mask ventilation without difficulty LMA: LMA inserted LMA Size: 5.0 Tube type: Oral Number of attempts: 1 Placement Confirmation: positive ETCO2 and breath sounds checked- equal and bilateral Tube secured with: Tape Dental Injury: Teeth and Oropharynx as per pre-operative assessment

## 2016-05-27 NOTE — Transfer of Care (Signed)
Immediate Anesthesia Transfer of Care Note  Patient: Dustin CousinJeffrey D Glahn  Procedure(s) Performed: Procedure(s): CYSTOSCOPY WITH STENT REMOVAL (N/A)  Patient Location: PACU  Anesthesia Type:General  Level of Consciousness: sedated  Airway & Oxygen Therapy: Patient Spontanous Breathing and Patient connected to face mask oxygen  Post-op Assessment: Report given to RN and Post -op Vital signs reviewed and stable  Post vital signs: Reviewed and stable  Last Vitals:  Vitals:   05/27/16 1328 05/27/16 1652  BP: 128/78   Pulse: 70 83  Resp: 18 18  Temp: 36.7 C 36.1 C    Complications: No apparent anesthesia complications

## 2016-05-27 NOTE — Discharge Summary (Signed)
Physician Discharge Summary  Patient ID: Dustin Williamson MRN: [id number] DOB/AGE: September 16, 1970 45 y.o.  Admit date: 05/21/2016 Discharge date: 05/27/2016  Admission Diagnoses:   Obstructed Left Ureteral Stent with Left Hydronephrosis and Refractory Colic Cocaine Intoxication  Discharge Diagnoses:  Active Problems:   Ureteral obstruction, left   Left ureteral stone   Discharged Condition: stable  Hospital Course: [facility] with an obstructed left ureteral stent that had been placed 05/05/2016 during a previous admission for an obstructing left ureteral stone with severe sepsis.  [facility]2016 revealed the 6 mm left ureteral stone just proximal to the UVJ with the stent in good position but worsening hydronephrosis.  A cystogram confirmed stent obstruction with the lack of reflux of contrast up the stent into the collecting system.  Exchange of the JJ ureteral stent could not be performed as the pt's urine drug screen was positive for cocaine.  Daily urine drug screens continued to be positive until this morning.  The patient, however, passed his stone last night.  As such, the patient was taken to the operating room and the left JJ ureteral stent removed.  The stone has been sent for analysis.  It is anticipated that the patient will be discharged from the floor after appropriate recovery in the PACU.  He will continue his Keflex for three more days and [facility].  Consults: None  Significant Diagnostic Studies: labs: as per the Admission H&P;  and radiology: as per the Admission H&P  Treatments: surgery: Cysto with Left Ureteral Stent removal  Discharge Exam: (PACU) Blood pressure 128/78, pulse 83, temperature 97 F (36.1 C), temperature source Temporal, resp. rate 18, height 5\' 6"  (1.676 m), weight 96.6 kg (213 lb), SpO2 96 %. General appearance: alert, cooperative and no distress Head: Normocephalic, without obvious  abnormality, atraumatic Eyes: negative Resp: clear to auscultation bilaterally and nL respiratory effort Cardio: regular rate and rhythm, S1, S2 normal, no murmur, click, rub or gallop GI: soft, non-tender; bowel sounds normal; no masses,  no organomegaly Extremities: extremities normal, atraumatic, no cyanosis or edema Pulses: 2+ and symmetric Skin: Skin color, texture, turgor normal. No rashes or lesions Neurologic: Grossly normal  Disposition: 01-Home or Self Care  Pt will continue all his preadmission medicines as previously prescribed. He will complete 3 more days of Keflex.  Signed: Marin OlpKim,  Atia Haupt H 05/27/2016, 5:08 PM

## 2016-05-27 NOTE — Op Note (Signed)
Cystoscopy with Removal of Ureteral Stent Procedure Note  Indications: Diagnosis  Pre-operative Diagnosis: Passed Left Ureteral Stone with Left Ureteral Stent in place  Post-operative Diagnosis: Same  Surgeon: Marin OlpKim,  Avilene Marrin H   Assistants: None  Anesthesia: General LMA anesthesia  ASA Class: per anesthesia records  Procedure Details  The risks, benefits, complications, treatment options, and expected outcomes were discussed with the patient. The patient concurred with the proposed plan, giving informed consent.  Cystoscopy with removal of a previously placed left JJ ureteral stent was performed today under general anesthesia, using sterile technique.   The patient was placed in the lithotomy position, prepped with Betadine, and draped in the usual sterile fashion. A 21 French sheath rigid cystoscope was used to inspect both the urethra and bladder using the 30 degree lenses.  Findings: Anterior urethra: normal without strictures and without scarring. Prostatic urethra: wnL no prostatic hyperplasia, without bladder neck contracture. Bladder: Normal mucosa, without lesions. Ureteral orifice(s) was/were seen bilateral. Ureteral orifice(s) bilateral were in the normal location. The previously placed left JJ ureteral stent was grasped with foreign body graspers and removed intact without difficulty.  Specimens: None               Complications:  None; patient tolerated the procedure well.         Disposition: To home after routine post-general anesthesia observation.         Condition: stable  Attending Attestation: A qualified resident physician was not available.

## 2016-05-27 NOTE — Anesthesia Preprocedure Evaluation (Signed)
Anesthesia Evaluation  Patient identified by MRN, date of birth, ID band Patient awake    Reviewed: Allergy & Precautions, H&P , NPO status , Patient's Chart, lab work & pertinent test results, reviewed documented beta blocker date and time   Airway Mallampati: II  TM Distance: >3 FB Neck ROM: full    Dental  (+) Teeth Intact   Pulmonary neg pulmonary ROS, sleep apnea , former smoker,    Pulmonary exam normal        Cardiovascular Exercise Tolerance: Good hypertension, negative cardio ROS Normal cardiovascular exam Rhythm:regular Rate:Normal     Neuro/Psych PSYCHIATRIC DISORDERS negative neurological ROS  negative psych ROS   GI/Hepatic negative GI ROS, Neg liver ROS,   Endo/Other  negative endocrine ROSdiabetes  Renal/GU Renal diseasenegative Renal ROS  negative genitourinary   Musculoskeletal   Abdominal   Peds  Hematology negative hematology ROS (+)   Anesthesia Other Findings   Reproductive/Obstetrics negative OB ROS                             Anesthesia Physical Anesthesia Plan  ASA: III and emergent  Anesthesia Plan: General LMA   Post-op Pain Management:    Induction:   Airway Management Planned:   Additional Equipment:   Intra-op Plan:   Post-operative Plan:   Informed Consent: I have reviewed the patients History and Physical, chart, labs and discussed the procedure including the risks, benefits and alternatives for the proposed anesthesia with the patient or authorized representative who has indicated his/her understanding and acceptance.     Plan Discussed with: CRNA  Anesthesia Plan Comments:         Anesthesia Quick Evaluation

## 2016-05-27 NOTE — Progress Notes (Signed)
Patient is discharged home with his mother after stent removal, he is in stable condition, he is alert and oriented discharge instructions were reviewed with the patient, he was informed that oxycodone script was not signed yet but he said he can get a script from Dr Apolinar JunesBrandon later and did not want to wait.

## 2016-05-27 NOTE — Progress Notes (Signed)
05/27/16  Subjective: Urine drug screen negative for cocaine this morning.  Remains NPO since midnight last night in anticipation of surgery.  Pt reports passing a stone early yesterday evening with minimal discomfort since.  Stone was saved in a specimen container and is inspected - it is consistent with the passage of the entire left distal ureteral stone.  Objective: Vital signs in last 24 hours: Temp:  [97.9 F (36.6 C)-98.3 F (36.8 C)] 97.9 F (36.6 C) (12/09 0508) Pulse Rate:  [71-79] 71 (12/09 0508) Resp:  [18] 18 (12/09 0508) BP: (124-139)/(82-105) 126/95 (12/09 0508) SpO2:  [95 %-97 %] 95 % (12/09 0508)  Intake/Output from previous day: 12/08 0701 - 12/09 0700 In: 2144 [P.O.:1440; I.V.:704] Out: 2450 [Urine:2450] Intake/Output this shift: Total I/O In: -  Out: 525 [Urine:525]  Physical Exam:  General: Alert and oriented.   Lungs: No increased work of breathing or respiratory distress.   Lab Results: No results for input(s): HGB, HCT in the last 72 hours.         Recent Labs  05/21/16 1631 05/22/16 0458 05/23/16 0539  CREATININE 1.39* 1.63* 1.57*           Results for orders placed or performed during the hospital encounter of 05/21/16 (from the past 24 hour(s))  Glucose, capillary     Status: None   Collection Time: 05/26/16 11:27 AM  Result Value Ref Range   Glucose-Capillary 91 65 - 99 mg/dL  Glucose, capillary     Status: Abnormal   Collection Time: 05/26/16  4:38 PM  Result Value Ref Range   Glucose-Capillary 210 (H) 65 - 99 mg/dL  Glucose, capillary     Status: None   Collection Time: 05/26/16  9:01 PM  Result Value Ref Range   Glucose-Capillary 68 65 - 99 mg/dL   Comment 1 Notify RN   Urine Drug Screen, Qualitative ([facility] only)     Status: Abnormal   Collection Time: 05/27/16  5:15 AM  Result Value Ref Range   Tricyclic, Ur Screen NONE DETECTED NONE DETECTED   Amphetamines, Ur Screen NONE DETECTED NONE DETECTED   MDMA (Ecstasy)Ur Screen  NONE DETECTED NONE DETECTED   Cocaine Metabolite,Ur [place] NONE DETECTED NONE DETECTED   Opiate, Ur Screen POSITIVE (A) NONE DETECTED   Phencyclidine (PCP) Ur S NONE DETECTED NONE DETECTED   Cannabinoid 50 Ng, Ur [place] NONE DETECTED NONE DETECTED   Barbiturates, Ur Screen NONE DETECTED NONE DETECTED   Benzodiazepine, Ur Scrn NONE DETECTED NONE DETECTED   Methadone Scn, Ur NONE DETECTED NONE DETECTED  Glucose, capillary     Status: Abnormal   Collection Time: 05/27/16  7:44 AM  Result Value Ref Range   Glucose-Capillary 104 (H) 65 - 99 mg/dL    Assessment/Plan: 45 year old male admitted with left ureteral stent occlusion and poorly controlled pain. Remains afebrile and hemodynamically stable.   Due to his cocaine positive status, he has been unable to undergo stent exchange or ureteroscopy until his urine drug screen became negative.  Fortunately, the patient passed his stone last night.  Discussed the need to still remove the stent.  -continue PO abx   -continue NPO for Cysto/Left Ureteral Stent removal under IVA (Consent signed). -Stone for Analysis  -Patient understands the importance of avoiding illicit drugs in the interim -continue sitter, patient to remain on unit  Anticipate potential discharge after stent removal.   LOS: 6 days   Marin OlpKim,  Livian Vanderbeck H 05/27/2016, 10:48 AM

## 2016-05-27 NOTE — Plan of Care (Signed)
Problem: Physical Regulation: Goal: Will remain free from infection Outcome: Progressing Educated patient on the importance of hand hygein to prevent infection

## 2016-05-27 NOTE — Progress Notes (Signed)
RN Archie Pattentonya called that Dr Selena BattenKim not on site and unable to do med rec for pt who is suppose to go home. I am going to do med rec to complete the discharge for pt. No rx have been given by me

## 2016-05-29 ENCOUNTER — Telehealth: Payer: Self-pay | Admitting: Urology

## 2016-05-29 ENCOUNTER — Other Ambulatory Visit: Payer: Self-pay

## 2016-05-29 ENCOUNTER — Other Ambulatory Visit: Payer: Self-pay | Admitting: Urology

## 2016-05-29 ENCOUNTER — Encounter: Payer: Self-pay | Admitting: Urology

## 2016-05-29 MED ORDER — KETOROLAC TROMETHAMINE 10 MG PO TABS: 10.0000 mg | ORAL_TABLET | Freq: Four times a day (QID) | ORAL | 0 refills | Status: DC | PRN

## 2016-05-29 NOTE — Telephone Encounter (Signed)
Created in error

## 2016-05-30 NOTE — Anesthesia Postprocedure Evaluation (Signed)
Anesthesia Post Note  Patient: Dustin Williamson  Procedure(s) Performed: Procedure(s) (LRB): CYSTOSCOPY WITH STENT REMOVAL (N/A)  Patient location during evaluation: PACU Anesthesia Type: General Level of consciousness: awake and alert Pain management: pain level controlled Vital Signs Assessment: post-procedure vital signs reviewed and stable Respiratory status: spontaneous breathing, nonlabored ventilation, respiratory function stable and patient connected to nasal cannula oxygen Cardiovascular status: blood pressure returned to baseline and stable Postop Assessment: no signs of nausea or vomiting Anesthetic complications: no    Last Vitals:  Vitals:   05/27/16 1652 05/27/16 1733  BP:    Pulse: 83   Resp: 18   Temp: 36.1 C 36.3 C    Last Pain:  Vitals:   05/27/16 1719  TempSrc:   PainSc: 0-No pain                 Yevette EdwardsJames G Adams

## 2016-06-02 LAB — STONE ANALYSIS
CA PHOS CRY STONE QL IR: 5 %
Ca Oxalate,Dihydrate: 5 %
Ca Oxalate,Monohydr.: 90 %
STONE WEIGHT KSTONE: 132 mg

## 2016-06-05 MED ORDER — LORAZEPAM 2 MG/ML IJ SOLN
INTRAMUSCULAR | Status: AC
  Filled 2016-06-05: qty 1

## 2016-06-23 ENCOUNTER — Ambulatory Visit: Payer: Self-pay | Admitting: Urology

## 2016-06-23 ENCOUNTER — Telehealth: Payer: Self-pay | Admitting: Urology

## 2016-06-23 ENCOUNTER — Encounter: Payer: Self-pay | Admitting: Urology

## 2016-06-23 DIAGNOSIS — N132 Hydronephrosis with renal and ureteral calculous obstruction: Secondary | ICD-10-CM

## 2016-06-23 NOTE — Telephone Encounter (Signed)
Renal ultrasound order placed. Please return to the patient and see if he like to reschedule with renal cell prior. Stress importance of this follow-up to rule out ongoing hydronephrosis.  Vanna ScotlandAshley Kuron Docken, MD

## 2016-06-23 NOTE — Telephone Encounter (Signed)
Pt didn't show up for appt today and renal u/s wasn't done.  There were no orders in.  Please advise what you'd like to do next.

## 2016-06-28 NOTE — Telephone Encounter (Signed)
I called and LM with whoever answered the phone to have him call me back. Let them know it was about an app that he missed.  Dustin DusterMichelle

## 2016-07-27 ENCOUNTER — Telehealth: Payer: Self-pay | Admitting: Urology

## 2016-07-27 NOTE — Telephone Encounter (Signed)
Sure. Harlow OhmsWhats it about?

## 2016-07-27 NOTE — Telephone Encounter (Signed)
I have tried to reach this patient can you send out a letter?  Thanks  [employer] FinancialMichelle

## 2016-07-28 NOTE — Telephone Encounter (Signed)
Pt didn't show up for appt today and renal u/s wasn't done.  There were no orders in.  Please advise what you'd like to do next.   Renal ultrasound order placed. Please return to the patient and see if he like to reschedule with renal cell prior. Stress importance of this follow-up to rule out ongoing hydronephrosis  This the message French Anaracy sent to Dr. Apolinar JunesBrandon and then Dr. Apolinar JunesBrandon forwarded it to me. He never responded to my calls   Thanks,  Marcelino DusterMichelle  I guess we just need to send out a letter

## 2016-08-02 NOTE — Telephone Encounter (Signed)
Certified letter sent 

## 2016-08-12 ENCOUNTER — Encounter: Payer: Self-pay | Admitting: Emergency Medicine

## 2016-08-12 ENCOUNTER — Observation Stay
Admission: EM | Admit: 2016-08-12 | Discharge: 2016-08-13 | Disposition: A | Discharge status: Home / Self Care | Payer: Self-pay | Attending: Internal Medicine | Admitting: Internal Medicine

## 2016-08-12 DIAGNOSIS — F32A Depression, unspecified: Secondary | ICD-10-CM | POA: Diagnosis present

## 2016-08-12 DIAGNOSIS — F329 Major depressive disorder, single episode, unspecified: Secondary | ICD-10-CM | POA: Insufficient documentation

## 2016-08-12 DIAGNOSIS — E860 Dehydration: Secondary | ICD-10-CM

## 2016-08-12 DIAGNOSIS — I10 Essential (primary) hypertension: Secondary | ICD-10-CM | POA: Diagnosis present

## 2016-08-12 DIAGNOSIS — F419 Anxiety disorder, unspecified: Secondary | ICD-10-CM | POA: Insufficient documentation

## 2016-08-12 DIAGNOSIS — E1165 Type 2 diabetes mellitus with hyperglycemia: Principal | ICD-10-CM | POA: Insufficient documentation

## 2016-08-12 DIAGNOSIS — Z87891 Personal history of nicotine dependence: Secondary | ICD-10-CM | POA: Insufficient documentation

## 2016-08-12 DIAGNOSIS — E785 Hyperlipidemia, unspecified: Secondary | ICD-10-CM | POA: Insufficient documentation

## 2016-08-12 DIAGNOSIS — Z79899 Other long term (current) drug therapy: Secondary | ICD-10-CM | POA: Insufficient documentation

## 2016-08-12 DIAGNOSIS — Z794 Long term (current) use of insulin: Secondary | ICD-10-CM | POA: Insufficient documentation

## 2016-08-12 DIAGNOSIS — Z9114 Patient's other noncompliance with medication regimen: Secondary | ICD-10-CM | POA: Insufficient documentation

## 2016-08-12 DIAGNOSIS — E119 Type 2 diabetes mellitus without complications: Secondary | ICD-10-CM

## 2016-08-12 DIAGNOSIS — G4733 Obstructive sleep apnea (adult) (pediatric): Secondary | ICD-10-CM | POA: Diagnosis present

## 2016-08-12 DIAGNOSIS — R739 Hyperglycemia, unspecified: Secondary | ICD-10-CM

## 2016-08-12 HISTORY — DX: Migraine, unspecified, not intractable, without status migrainosus: G43.909

## 2016-08-12 LAB — URINALYSIS, COMPLETE (UACMP) WITH MICROSCOPIC
BACTERIA UA: NONE SEEN
BILIRUBIN URINE: NEGATIVE
Glucose, UA: >=500 mg/dL — AB
HGB URINE DIPSTICK: NEGATIVE
KETONES UR: NEGATIVE mg/dL
LEUKOCYTES UA: NEGATIVE
NITRITE: NEGATIVE
PH: 6 (ref 5.0–8.0)
Protein, ur: NEGATIVE mg/dL
RBC / HPF: NONE SEEN RBC/hpf (ref 0–5)
Specific Gravity, Urine: 1.027 (ref 1.005–1.030)

## 2016-08-12 LAB — CBC
HEMATOCRIT: 47.1 % (ref 40.0–52.0)
HEMOGLOBIN: 16 g/dL (ref 13.0–18.0)
MCH: 30 pg (ref 26.0–34.0)
MCHC: 34 g/dL (ref 32.0–36.0)
MCV: 88.3 fL (ref 80.0–100.0)
Platelets: 209 10*3/uL (ref 150–440)
RBC: 5.34 MIL/uL (ref 4.40–5.90)
RDW: 12.4 % (ref 11.5–14.5)
WBC: 10.5 10*3/uL (ref 3.8–10.6)

## 2016-08-12 LAB — BASIC METABOLIC PANEL
ANION GAP: 9 (ref 5–15)
BUN: 17 mg/dL (ref 6–20)
CO2: 23 mmol/L (ref 22–32)
Calcium: 8.9 mg/dL (ref 8.9–10.3)
Chloride: 91 mmol/L — ABNORMAL LOW (ref 101–111)
Creatinine, Ser: 1.27 mg/dL — ABNORMAL HIGH (ref 0.61–1.24)
GFR calc Af Amer: >60 mL/min (ref >60)
Glucose, Bld: 670 mg/dL (ref 65–99)
POTASSIUM: 4.3 mmol/L (ref 3.5–5.1)
SODIUM: 123 mmol/L — AB (ref 135–145)

## 2016-08-12 LAB — GLUCOSE, CAPILLARY: GLUCOSE-CAPILLARY: 375 mg/dL — AB (ref 65–99)

## 2016-08-12 MED ORDER — GABAPENTIN 100 MG PO CAPS
100.0000 mg | ORAL_CAPSULE | Freq: Three times a day (TID) | ORAL | Status: DC
  Administered 2016-08-13 (×2): 100 mg via ORAL
  Filled 2016-08-12 (×2): qty 1

## 2016-08-12 MED ORDER — OXYCODONE-ACETAMINOPHEN 5-325 MG PO TABS
1.0000 | ORAL_TABLET | Freq: Four times a day (QID) | ORAL | Status: DC | PRN
  Administered 2016-08-13: 1 via ORAL
  Filled 2016-08-12: qty 1

## 2016-08-12 MED ORDER — ACETAMINOPHEN 325 MG PO TABS: 650.0000 mg | ORAL_TABLET | Freq: Four times a day (QID) | ORAL | Status: DC | PRN

## 2016-08-12 MED ORDER — KETOROLAC TROMETHAMINE 10 MG PO TABS
10.0000 mg | ORAL_TABLET | Freq: Four times a day (QID) | ORAL | Status: DC | PRN
  Filled 2016-08-12: qty 1

## 2016-08-12 MED ORDER — LABETALOL HCL 5 MG/ML IV SOLN: 5.0000 mg | INTRAVENOUS | Status: DC | PRN

## 2016-08-12 MED ORDER — INSULIN ASPART 100 UNIT/ML ~~LOC~~ SOLN
0.0000 [IU] | Freq: Three times a day (TID) | SUBCUTANEOUS | Status: DC
  Administered 2016-08-12: 9 [IU] via SUBCUTANEOUS
  Administered 2016-08-13: 5 [IU] via SUBCUTANEOUS
  Administered 2016-08-13: 2 [IU] via SUBCUTANEOUS
  Filled 2016-08-12: qty 2
  Filled 2016-08-12: qty 5

## 2016-08-12 MED ORDER — ACETAMINOPHEN 650 MG RE SUPP: 650.0000 mg | Freq: Four times a day (QID) | RECTAL | Status: DC | PRN

## 2016-08-12 MED ORDER — INSULIN ASPART 100 UNIT/ML ~~LOC~~ SOLN
SUBCUTANEOUS | Status: AC
  Filled 2016-08-12: qty 9

## 2016-08-12 MED ORDER — LISINOPRIL 10 MG PO TABS
10.0000 mg | ORAL_TABLET | Freq: Every day | ORAL | Status: DC
  Administered 2016-08-13: 10 mg via ORAL
  Filled 2016-08-12: qty 1

## 2016-08-12 MED ORDER — SODIUM CHLORIDE 0.9 % IV BOLUS (SEPSIS)
1000.0000 mL | Freq: Once | INTRAVENOUS | Status: AC
  Administered 2016-08-12: 1000 mL via INTRAVENOUS

## 2016-08-12 NOTE — ED Notes (Signed)
Pt states continues to have foot "tingling and pain". Pt watching tv in no acute distress. Will release pain medication order from admission.

## 2016-08-12 NOTE — ED Notes (Signed)
Pt updated on delay and reason for delay to floor.

## 2016-08-12 NOTE — ED Notes (Signed)
Dr. Anne HahnWillis returned page.

## 2016-08-12 NOTE — ED Triage Notes (Signed)
Patient with a history of DM 2. Patient states that he has not been able to get his medications or check his blood sugar for a couple of weeks due to finances. Patient states that today he was able to get a meter and his blood sugar was to high to read. Patient states that he has had increase thirst and blurred vision times two weeks.

## 2016-08-12 NOTE — H&P (Signed)
[facility] [place] [facility]   PATIENT NAME: Dustin Williamson    MR#:  [id number]  DATE OF BIRTH:  13-Aug-1970  DATE OF ADMISSION:  08/12/2016  PRIMARY CARE PHYSICIAN: Emogene Morgan, MD   REQUESTING/REFERRING PHYSICIAN: Cyril Loosen, MD  CHIEF COMPLAINT:   Chief Complaint  Patient presents with  . Hyperglycemia    HISTORY OF PRESENT ILLNESS:  Dustin Williamson  is a 46 y.o. male who presents with Polydipsia, polyuria, hyperglycemia. Patient has diabetes and is been as medication for about a month. He comes in today significant only dehydrated with a glucose in the 600s. His anion gap is within normal limits, but hospitalists were called for admission for management for his diabetes.  PAST MEDICAL HISTORY:   Past Medical History:  Diagnosis Date  . Anxiety   . Depressed   . Diabetes mellitus without complication (HCC)   . Heartburn   . HLD (hyperlipidemia)   . Hypertension   . Kidney stone   . Migraines   . Sleep apnea     PAST SURGICAL HISTORY:   Past Surgical History:  Procedure Laterality Date  . CYSTOSCOPY W/ URETERAL STENT REMOVAL N/A 05/27/2016   Procedure: CYSTOSCOPY WITH STENT REMOVAL;  Surgeon: Marin Olp, MD;  Location: [facility] ORS;  Service: Urology;  Laterality: N/A;  . CYSTOSCOPY WITH STENT PLACEMENT Left 05/05/2016   Procedure: CYSTOSCOPY WITH STENT PLACEMENT;  Surgeon: Vanna Scotland, MD;  Location: [facility] ORS;  Service: Urology;  Laterality: Left;  . HERNIA REPAIR    . KNEE SURGERY Left 2009    SOCIAL HISTORY:   Social History  Substance Use Topics  . Smoking status: Former Smoker    Quit date: 06/19/2000  . Smokeless tobacco: Never Used  . Alcohol use No    FAMILY HISTORY:   Family History  Problem Relation Age of Onset  . Diabetes Mellitus II Father   . Prostate cancer Neg Hx   . Kidney disease Neg Hx   . Bladder Cancer Neg Hx     DRUG ALLERGIES:  No Known Allergies  MEDICATIONS AT HOME:   Prior to Admission  medications   Medication Sig Start Date End Date Taking? Authorizing Provider  atorvastatin (LIPITOR) 20 MG tablet Take 20 mg by mouth daily.    Historical Provider, MD  hydrOXYzine (ATARAX/VISTARIL) 25 MG tablet Take 25 mg by mouth 3 (three) times daily.    Historical Provider, MD  insulin glargine (LANTUS) 100 UNIT/ML injection Inject 0.24 mLs (24 Units total) into the skin at bedtime. 05/06/16   Alford Highland, MD  ketorolac (TORADOL) 10 MG tablet Take 1 tablet (10 mg total) by mouth every 6 (six) hours as needed. 05/29/16   Carollee Herter A McGowan, PA-C  lisinopril (PRINIVIL,ZESTRIL) 10 MG tablet Take 10 mg by mouth daily.    Historical Provider, MD  metFORMIN (GLUCOPHAGE) 1000 MG tablet Take 1,000 mg by mouth 2 (two) times daily with a meal.    Historical Provider, MD  oxybutynin (DITROPAN) 5 MG tablet Take 1 tablet (5 mg total) by mouth every 8 (eight) hours as needed for bladder spasms. 05/06/16   Vanna Scotland, MD  oxyCODONE-acetaminophen (ROXICET) 5-325 MG tablet Take 1 tablet by mouth every 6 (six) hours as needed for severe pain. 05/15/16   Carollee Herter A McGowan, PA-C  PARoxetine (PAXIL) 30 MG tablet Take 30 mg by mouth daily.    Historical Provider, MD  propranolol (INDERAL) 80 MG tablet Take 80 mg by mouth 2 (two) times  daily.    Historical Provider, MD  tamsulosin (FLOMAX) 0.4 MG CAPS capsule Take 1 capsule (0.4 mg total) by mouth daily. 04/16/16   Sharman CheekPhillip Stafford, MD    REVIEW OF SYSTEMS:  Review of Systems  Constitutional: Negative for chills, fever, malaise/fatigue and weight loss.  HENT: Negative for ear pain, hearing loss and tinnitus.   Eyes: Negative for blurred vision, double vision, pain and redness.  Respiratory: Negative for cough, hemoptysis and shortness of breath.   Cardiovascular: Negative for chest pain, palpitations, orthopnea and leg swelling.  Gastrointestinal: Negative for abdominal pain, constipation, diarrhea, nausea and vomiting.  Genitourinary: Negative for  dysuria, frequency and hematuria.  Musculoskeletal: Negative for back pain, joint pain and neck pain.  Skin:       No acne, rash, or lesions  Neurological: Negative for dizziness, tremors, focal weakness and weakness.  Endo/Heme/Allergies: Positive for polydipsia. Does not bruise/bleed easily.       Polyuria  Psychiatric/Behavioral: Negative for depression. The patient is not nervous/anxious and does not have insomnia.      VITAL SIGNS:   Vitals:   08/12/16 2018 08/12/16 2100 08/12/16 2115 08/12/16 2145  BP:  (!) 133/99    Pulse:  92 97 88  Resp:  16 20 18   Temp:      TempSrc:      SpO2:  98% 95% 96%  Weight: 97.1 kg (214 lb)     Height: 5\' 7"  (1.702 m)      Wt Readings from Last 3 Encounters:  08/12/16 97.1 kg (214 lb)  05/21/16 96.6 kg (213 lb)  05/20/16 99.8 kg (220 lb)    PHYSICAL EXAMINATION:  Physical Exam  Vitals reviewed. Constitutional: He is oriented to person, place, and time. He appears well-developed and well-nourished. No distress.  HENT:  Head: Normocephalic and atraumatic.  Dry mucous membranes  Eyes: Conjunctivae and EOM are normal. Pupils are equal, round, and reactive to light. No scleral icterus.  Neck: Normal range of motion. Neck supple. No JVD present. No thyromegaly present.  Cardiovascular: Normal rate, regular rhythm and intact distal pulses.  Exam reveals no gallop and no friction rub.   No murmur heard. Respiratory: Effort normal and breath sounds normal. No respiratory distress. He has no wheezes. He has no rales.  GI: Soft. Bowel sounds are normal. He exhibits no distension. There is no tenderness.  Musculoskeletal: Normal range of motion. He exhibits no edema.  No arthritis, no gout  Lymphadenopathy:    He has no cervical adenopathy.  Neurological: He is alert and oriented to person, place, and time. No cranial nerve deficit.  No dysarthria, no aphasia  Skin: Skin is warm and dry. No rash noted. No erythema.  Psychiatric: He has a normal  mood and affect. His behavior is normal. Judgment and thought content normal.    LABORATORY PANEL:   CBC  Recent Labs Lab 08/12/16 2030  WBC 10.5  HGB 16.0  HCT 47.1  PLT 209   ------------------------------------------------------------------------------------------------------------------  Chemistries   Recent Labs Lab 08/12/16 2030  NA 123*  K 4.3  CL 91*  CO2 23  GLUCOSE 670*  BUN 17  CREATININE 1.27*  CALCIUM 8.9   ------------------------------------------------------------------------------------------------------------------  Cardiac Enzymes No results for input(s): TROPONINI in the last 168 hours. ------------------------------------------------------------------------------------------------------------------  RADIOLOGY:  No results found.  EKG:   Orders placed or performed during the hospital encounter of 05/04/16  . EKG 12-Lead  . EKG 12-Lead  . EKG    IMPRESSION AND PLAN:  Principal Problem:   Hyperglycemia - IV fluids and placed to the patient's significant dehydration, will use subcutaneous insulin to control his glucose values. Active Problems:   Diabetes (HCC) - insulin and fluids as above, carb modified diet   HTN (hypertension) - continue home meds   Depression - continue home meds  All the records are reviewed and case discussed with ED provider. Management plans discussed with the patient and/or family.  DVT PROPHYLAXIS: SubQ lovenox  GI PROPHYLAXIS: None  ADMISSION STATUS: Observation  CODE STATUS: Full Code Status History    Date Active Date Inactive Code Status Order ID Comments User Context   05/22/2016 12:01 AM 05/27/2016 11:29 PM Full Code [id number]  Marin Olp, MD ED   05/04/2016 11:59 PM 05/06/2016  8:19 PM Full Code [id number]  Oralia Manis, MD Inpatient   09/26/2015 10:31 AM 09/28/2015  9:48 PM Full Code [id number]  Arnaldo Natal, MD Inpatient      TOTAL TIME TAKING CARE OF THIS PATIENT: 40 minutes.    Aliyha Fornes,  Yahshua Thibault FIELDING 08/12/2016, 10:16 PM  [facility]  Office  [phone]  CC: Primary care physician; Emogene Morgan, MD

## 2016-08-12 NOTE — ED Notes (Signed)
Pt complains of increased thirst and urination. Pt states had has not taken his metformin in over one month. Pt states he is splitting his lantus into bid dosage instead of daily dosage because he is out of metformin.

## 2016-08-12 NOTE — ED Notes (Addendum)
Called report to floor, rn on floor would like pt to have blood pressure "addressed"; before arrival to floor. hospitalist paged x2 without response at this time.

## 2016-08-12 NOTE — ED Provider Notes (Signed)
[facility] Emergency Department Provider Note   ____________________________________________    I have reviewed the triage vital signs and the nursing notes.   HISTORY  Chief Complaint Hyperglycemia     HPI Dustin Williamson is a 46 y.o. male who presents with high blood sugar. Patient reports she has not had his diabetic medication in the last month because of financial reasons. He complains of severe thirst, polyuria, dizziness, blurry vision. He states he is able to check his blood sugar today and was over 600. He denies fevers or chills. No abdominal pain. No cough or shortness of breath.   Past Medical History:  Diagnosis Date  . Anxiety   . Depressed   . Diabetes mellitus without complication (HCC)   . Heartburn   . HLD (hyperlipidemia)   . Hypertension   . Kidney stone   . Migraines   . Sleep apnea     Patient Active Problem List   Diagnosis Date Noted  . OSA (obstructive sleep apnea) 08/12/2016  . HTN (hypertension) 08/12/2016  . Depression 08/12/2016  . Left ureteral stone   . Ureteral obstruction, left 05/22/2016  . Acute left flank pain   . AKI (acute kidney injury) (HCC) 05/04/2016  . Diabetes (HCC) 05/04/2016  . Hypotension 05/04/2016  . Urinary obstruction 05/04/2016  . Hyperglycemia 09/26/2015  . CLOSED FRACTURE OF BASE OF OTHER METACARPAL BONE 03/09/2009    Past Surgical History:  Procedure Laterality Date  . CYSTOSCOPY W/ URETERAL STENT REMOVAL N/A 05/27/2016   Procedure: CYSTOSCOPY WITH STENT REMOVAL;  Surgeon: Marin OlpJay H Kim, MD;  Location: [facility] ORS;  Service: Urology;  Laterality: N/A;  . CYSTOSCOPY WITH STENT PLACEMENT Left 05/05/2016   Procedure: CYSTOSCOPY WITH STENT PLACEMENT;  Surgeon: Vanna ScotlandAshley Brandon, MD;  Location: [facility] ORS;  Service: Urology;  Laterality: Left;  . HERNIA REPAIR    . KNEE SURGERY Left 2009    Prior to Admission medications   Medication Sig Start Date End Date Taking? Authorizing Provider    atorvastatin (LIPITOR) 20 MG tablet Take 20 mg by mouth daily.    Historical Provider, MD  hydrOXYzine (ATARAX/VISTARIL) 25 MG tablet Take 25 mg by mouth 3 (three) times daily.    Historical Provider, MD  insulin glargine (LANTUS) 100 UNIT/ML injection Inject 0.24 mLs (24 Units total) into the skin at bedtime. 05/06/16   Alford Highlandichard Wieting, MD  ketorolac (TORADOL) 10 MG tablet Take 1 tablet (10 mg total) by mouth every 6 (six) hours as needed. 05/29/16   Carollee HerterShannon A McGowan, PA-C  lisinopril (PRINIVIL,ZESTRIL) 10 MG tablet Take 10 mg by mouth daily.    Historical Provider, MD  metFORMIN (GLUCOPHAGE) 1000 MG tablet Take 1,000 mg by mouth 2 (two) times daily with a meal.    Historical Provider, MD  oxybutynin (DITROPAN) 5 MG tablet Take 1 tablet (5 mg total) by mouth every 8 (eight) hours as needed for bladder spasms. 05/06/16   Vanna ScotlandAshley Brandon, MD  oxyCODONE-acetaminophen (ROXICET) 5-325 MG tablet Take 1 tablet by mouth every 6 (six) hours as needed for severe pain. 05/15/16   Carollee HerterShannon A McGowan, PA-C  PARoxetine (PAXIL) 30 MG tablet Take 30 mg by mouth daily.    Historical Provider, MD  propranolol (INDERAL) 80 MG tablet Take 80 mg by mouth 2 (two) times daily.    Historical Provider, MD  tamsulosin (FLOMAX) 0.4 MG CAPS capsule Take 1 capsule (0.4 mg total) by mouth daily. 04/16/16   Sharman CheekPhillip Stafford, MD  Allergies Patient has no known allergies.  Family History  Problem Relation Age of Onset  . Diabetes Mellitus II Father   . Prostate cancer Neg Hx   . Kidney disease Neg Hx   . Bladder Cancer Neg Hx     Social History Social History  Substance Use Topics  . Smoking status: Former Smoker    Quit date: 06/19/2000  . Smokeless tobacco: Never Used  . Alcohol use No    Review of Systems  Constitutional: No fever/chills Eyes: Blurry vision  ENT: No sore throat. Cardiovascular: Denies chest pain. Respiratory: No cough Gastrointestinal: No abdominal pain.     Genitourinary: Negative for  dysuria. Musculoskeletal: Negative for back pain. Skin: Negative for rash. Neurological: Negative for headaches   10-point ROS otherwise negative.  ____________________________________________   PHYSICAL EXAM:  VITAL SIGNS: ED Triage Vitals  Enc Vitals Group     BP 08/12/16 2017 (!) 150/115     Pulse Rate 08/12/16 2017 (!) 122     Resp 08/12/16 2017 18     Temp 08/12/16 2017 97.8 F (36.6 C)     Temp Source 08/12/16 2017 Oral     SpO2 08/12/16 2017 97 %     Weight 08/12/16 2018 214 lb (97.1 kg)     Height 08/12/16 2018 5\' 7"  (1.702 m)     Head Circumference --      Peak Flow --      Pain Score 08/12/16 2018 10     Pain Loc --      Pain Edu? --      Excl. in GC? --     Constitutional: Alert and oriented. No acute distress. Pleasant and interactive Eyes: Conjunctivae are normal.   Nose: No congestion/rhinnorhea. Mouth/Throat: Mucous membranes are moist.   Neck:  Painless ROM Cardiovascular: Normal rate, regular rhythm. Grossly normal heart sounds.  Good peripheral circulation. Respiratory: Normal respiratory effort.  No retractions. Lungs CTAB. Gastrointestinal: Soft and nontender. No distention.  No CVA tenderness. Genitourinary: deferred Musculoskeletal: No lower extremity tenderness nor edema.  Warm and well perfused Neurologic:  Normal speech and language. No gross focal neurologic deficits are appreciated.  Skin:  Skin is warm, dry and intact. No rash noted. Psychiatric: Mood and affect are normal. Speech and behavior are normal.  ____________________________________________   LABS (all labs ordered are listed, but only abnormal results are displayed)  Labs Reviewed  BASIC METABOLIC PANEL - Abnormal; Notable for the following:       Result Value   Sodium 123 (*)    Chloride 91 (*)    Glucose, Bld 670 (*)    Creatinine, Ser 1.27 (*)    All other components within normal limits  URINALYSIS, COMPLETE (UACMP) WITH MICROSCOPIC - Abnormal; Notable for the  following:    Color, Urine STRAW (*)    APPearance CLEAR (*)    Glucose, UA >=500 (*)    Squamous Epithelial / LPF 0-5 (*)    All other components within normal limits  GLUCOSE, CAPILLARY - Abnormal; Notable for the following:    Glucose-Capillary >600 (*)    All other components within normal limits  GLUCOSE, CAPILLARY - Abnormal; Notable for the following:    Glucose-Capillary >600 (*)    All other components within normal limits  CBC  CBG MONITORING, ED   ____________________________________________  EKG  None ____________________________________________  RADIOLOGY  None ____________________________________________   PROCEDURES  Procedure(s) performed: No    Critical Care performed:No ____________________________________________   INITIAL IMPRESSION /  ASSESSMENT AND PLAN / ED COURSE  Pertinent labs & imaging results that were available during my care of the patient were reviewed by me and considered in my medical decision making (see chart for details).  Patient presents with hyperglycemia, his anion gap is normal. IV fluids are infusing. Discussed with hospitalist for admission given the severity of his hyperglycemia and we are unable to provide him with medications    ____________________________________________   FINAL CLINICAL IMPRESSION(S) / ED DIAGNOSES  Final diagnoses:  Hyperglycemia  Dehydration      NEW MEDICATIONS STARTED DURING THIS VISIT:  New Prescriptions   No medications on file     Note:  This document was prepared using Dragon voice recognition software and may include unintentional dictation errors.    Jene Every, MD 08/12/16 2216

## 2016-08-12 NOTE — ED Notes (Signed)
hospitalist in to see pt.

## 2016-08-13 LAB — GLUCOSE, CAPILLARY
GLUCOSE-CAPILLARY: 182 mg/dL — AB (ref 65–99)
GLUCOSE-CAPILLARY: 271 mg/dL — AB (ref 65–99)
Glucose-Capillary: 235 mg/dL — ABNORMAL HIGH (ref 65–99)
Glucose-Capillary: 276 mg/dL — ABNORMAL HIGH (ref 65–99)

## 2016-08-13 LAB — BASIC METABOLIC PANEL
ANION GAP: 7 (ref 5–15)
BUN: 16 mg/dL (ref 6–20)
CALCIUM: 8.4 mg/dL — AB (ref 8.9–10.3)
CO2: 27 mmol/L (ref 22–32)
Chloride: 100 mmol/L — ABNORMAL LOW (ref 101–111)
Creatinine, Ser: 1.11 mg/dL (ref 0.61–1.24)
Glucose, Bld: 234 mg/dL — ABNORMAL HIGH (ref 65–99)
Potassium: 3.8 mmol/L (ref 3.5–5.1)
SODIUM: 134 mmol/L — AB (ref 135–145)

## 2016-08-13 LAB — CBC
HCT: 41.6 % (ref 40.0–52.0)
HEMOGLOBIN: 14.6 g/dL (ref 13.0–18.0)
MCH: 30.2 pg (ref 26.0–34.0)
MCHC: 35 g/dL (ref 32.0–36.0)
MCV: 86.2 fL (ref 80.0–100.0)
Platelets: 208 10*3/uL (ref 150–440)
RBC: 4.82 MIL/uL (ref 4.40–5.90)
RDW: 12.5 % (ref 11.5–14.5)
WBC: 11 10*3/uL — ABNORMAL HIGH (ref 3.8–10.6)

## 2016-08-13 MED ORDER — ONDANSETRON HCL 4 MG/2ML IJ SOLN: 4.0000 mg | Freq: Four times a day (QID) | INTRAMUSCULAR | Status: DC | PRN

## 2016-08-13 MED ORDER — ONDANSETRON HCL 4 MG PO TABS: 4.0000 mg | ORAL_TABLET | Freq: Four times a day (QID) | ORAL | Status: DC | PRN

## 2016-08-13 MED ORDER — ATORVASTATIN CALCIUM 20 MG PO TABS
20.0000 mg | ORAL_TABLET | Freq: Every day | ORAL | Status: DC
  Administered 2016-08-13: 20 mg via ORAL
  Filled 2016-08-13: qty 1

## 2016-08-13 MED ORDER — OXYCODONE-ACETAMINOPHEN 5-325 MG PO TABS: 1.0000 | ORAL_TABLET | Freq: Four times a day (QID) | ORAL | 0 refills | Status: DC | PRN

## 2016-08-13 MED ORDER — ATORVASTATIN CALCIUM 20 MG PO TABS: 20.0000 mg | ORAL_TABLET | Freq: Every day | ORAL | 0 refills | Status: DC

## 2016-08-13 MED ORDER — PAROXETINE HCL 20 MG PO TABS
30.0000 mg | ORAL_TABLET | Freq: Every day | ORAL | Status: DC
  Administered 2016-08-13: 30 mg via ORAL
  Filled 2016-08-13: qty 2

## 2016-08-13 MED ORDER — HYDROXYZINE HCL 25 MG PO TABS
25.0000 mg | ORAL_TABLET | Freq: Three times a day (TID) | ORAL | Status: DC
  Administered 2016-08-13: 25 mg via ORAL
  Filled 2016-08-13: qty 1

## 2016-08-13 MED ORDER — HYDROXYZINE HCL 25 MG PO TABS: 25.0000 mg | ORAL_TABLET | Freq: Three times a day (TID) | ORAL | 0 refills | Status: DC

## 2016-08-13 MED ORDER — SODIUM CHLORIDE 0.9 % IV SOLN
INTRAVENOUS | Status: AC
  Administered 2016-08-13: 01:00:00 via INTRAVENOUS

## 2016-08-13 MED ORDER — PROPRANOLOL HCL 80 MG PO TABS: 80.0000 mg | ORAL_TABLET | Freq: Two times a day (BID) | ORAL | 0 refills | Status: DC

## 2016-08-13 MED ORDER — PAROXETINE HCL 30 MG PO TABS: 30.0000 mg | ORAL_TABLET | Freq: Every day | ORAL | 0 refills | Status: DC

## 2016-08-13 MED ORDER — PROPRANOLOL HCL 80 MG PO TABS
80.0000 mg | ORAL_TABLET | Freq: Two times a day (BID) | ORAL | Status: DC
  Administered 2016-08-13 (×2): 80 mg via ORAL
  Filled 2016-08-13: qty 1

## 2016-08-13 MED ORDER — TAMSULOSIN HCL 0.4 MG PO CAPS
0.4000 mg | ORAL_CAPSULE | Freq: Every day | ORAL | Status: DC
  Filled 2016-08-13: qty 1

## 2016-08-13 MED ORDER — METFORMIN HCL 1000 MG PO TABS: 1000.0000 mg | ORAL_TABLET | Freq: Two times a day (BID) | ORAL | 0 refills | Status: DC

## 2016-08-13 MED ORDER — ENOXAPARIN SODIUM 40 MG/0.4ML ~~LOC~~ SOLN: 40.0000 mg | SUBCUTANEOUS | Status: DC

## 2016-08-13 MED ORDER — LISINOPRIL 10 MG PO TABS: 10.0000 mg | ORAL_TABLET | Freq: Every day | ORAL | 0 refills | Status: DC

## 2016-08-13 NOTE — ED Notes (Signed)
Charge rn on floor notified pt is on way to floor.

## 2016-08-13 NOTE — Discharge Instructions (Addendum)
[facility] [place] at [facility]  DIET:  Diabetic diet, low sodium diet  DISCHARGE CONDITION:  Stable  ACTIVITY:  Activity as tolerated  OXYGEN:  Home Oxygen: No.   Oxygen Delivery: room air  DISCHARGE LOCATION:  home    ADDITIONAL DISCHARGE INSTRUCTION:   If you experience worsening of your admission symptoms, develop shortness of breath, life threatening emergency, suicidal or homicidal thoughts you must seek medical attention immediately by calling 911 or calling your MD immediately  if symptoms less severe.  You Must read complete instructions/literature along with all the possible adverse reactions/side effects for all the Medicines you take and that have been prescribed to you. Take any new Medicines after you have completely understood and accpet all the possible adverse reactions/side effects.   Please note  You were cared for by a hospitalist during your hospital stay. If you have any questions about your discharge medications or the care you received while you were in the hospital after you are discharged, you can call the unit and asked to speak with the hospitalist on call if the hospitalist that took care of you is not available. Once you are discharged, your primary care physician will handle any further medical issues. Please note that NO REFILLS for any discharge medications will be authorized once you are discharged, as it is imperative that you return to your primary care physician (or establish a relationship with a primary care physician if you do not have one) for your aftercare needs so that they can reassess your need for medications and monitor your lab values.   Follow all MD discharge instructions. Take all medications as prescribed. Keep all follow up appointments. If your symptoms return, call your doctor. If you experience any new symptoms that are of concern to you or that are bothersome to you, call your doctor. For all questions  and/or concerns, call your doctor.   If you have a medical emergency, call 911    Hyperglycemia Hyperglycemia is when the sugar (glucose) level in your blood is too high. It may not cause symptoms. If you do have symptoms, they may include warning signs, such as:  Feeling more thirsty than normal.  Hunger.  Feeling tired.  Needing to pee (urinate) more than normal.  Blurry eyesight (vision). You may get other symptoms as it gets worse, such as:  Dry mouth.  Not being hungry (loss of appetite).  Fruity-smelling breath.  Weakness.  Weight gain or loss that is not planned. Weight loss may be fast.  A tingling or numb feeling in your hands or feet.  Headache.  Skin that does not bounce back quickly when it is lightly pinched and released (poor skin turgor).  Pain in your belly (abdomen).  Cuts or bruises that heal slowly. High blood sugar can happen to people who do or do not have diabetes. High blood sugar can happen slowly or quickly, and it can be an emergency. Follow these instructions at home: General instructions  Take over-the-counter and prescription medicines only as told by your doctor.  Do not use products that contain nicotine or tobacco, such as cigarettes and e-cigarettes. If you need help quitting, ask your doctor.  Limit alcohol intake to no more than 1 drink per day for nonpregnant women and 2 drinks per day for men. One drink equals 12 oz of beer, 5 oz of wine, or 1 oz of hard liquor.  Manage stress. If you need help with this, ask your doctor.  Keep all follow-up visits as told by your doctor. This is important. Eating and drinking  Stay at a healthy weight.  Exercise regularly, as told by your doctor.  Drink enough fluid, especially when you:  Exercise.  Get sick.  Are in hot temperatures.  Eat healthy foods, such as:  Low-fat (lean) proteins.  Complex carbs (complex carbohydrates), such as whole wheat bread or brown  rice.  Fresh fruits and vegetables.  Low-fat dairy products.  Healthy fats.  Drink enough fluid to keep your pee (urine) clear or pale yellow. If you have diabetes:  Make sure you know the symptoms of hyperglycemia.  Follow your diabetes management plan, as told by your doctor. Make sure you:  Take insulin and medicines as told.  Follow your exercise plan.  Follow your meal plan. Eat on time. Do not skip meals.  Check your blood sugar as often as told. Make sure to check before and after exercise. If you exercise longer or in a different way than you normally do, check your blood sugar more often.  Follow your sick day plan whenever you cannot eat or drink normally. Make this plan ahead of time with your doctor.  Share your diabetes management plan with people in your workplace, school, and household.  Check your urine for ketones when you are ill and as told by your doctor.  Carry a card or wear jewelry that says that you have diabetes. Contact a doctor if:  Your blood sugar level is higher than 240 mg/dL (78.2 mmol/L) for 2 days in a row.  You have problems keeping your blood sugar in your target range.  High blood sugar happens often for you. Get help right away if:  You have trouble breathing.  You have a change in how you think, feel, or act (mental status).  You feel sick to your stomach (nauseous), and that feeling does not go away.  You cannot stop throwing up (vomiting). These symptoms may be an emergency. Do not wait to see if the symptoms will go away. Get medical help right away. Call your local emergency services (911 in the [place] Do not drive yourself to the hospital.  Summary  Hyperglycemia is when the sugar (glucose) level in your blood is too high.  High blood sugar can happen to people who do or do not have diabetes.  Make sure you drink enough fluids, eat healthy foods, and exercise regularly.  Contact your doctor if you have problems keeping  your blood sugar in your target range. This information is not intended to replace advice given to you by your health care provider. Make sure you discuss any questions you have with your health care provider. Document Released: 04/02/2009 Document Revised: 02/21/2016 Document Reviewed: 02/21/2016 Elsevier Interactive Patient Education  2017 [employer].

## 2016-08-13 NOTE — Discharge Summary (Signed)
[facility] - [place] at [facility], [place] y.o., DOB October 13, 1970, MRN [id number]. Admission date: 08/12/2016 Discharge Date 08/13/2016 Primary MD Emogene Morgan, MD Admitting Physician Oralia Manis, MD  Admission Diagnosis  Dehydration [E86.0] Hyperglycemia [R73.9]  Discharge Diagnosis   Principal Problem:   Hyperglycemia suspect due to med noncompliance   Diabetes (HCC)   OSA (obstructive sleep apnea)   HTN (hypertension)   Depression         Hospital Course Dustin Williamson  is a 46 y.o. male who presents with Polydipsia, polyuria, hyperglycemia. Patient has diabetes and is been as medication for about a month. He comes in the emergency room with significant only dehydrated with a glucose in the 600s. His anion gap is within normal limits, but hospitalists were called for admission for management for his diabetes. Patient was given IV hydration and restarted on his insulin. With this therapy his blood sugars are in the 170s. Patient reported that he could not afford rest of his medications a case manager consult was obtained they have given him coupons to assist with his medications. Patient needs to follow up with primary care provider and keep taking his medications.             Consults  None  Significant Tests:  See full reports for all details     No results found.     Today   Subjective:   Beckey Downing  Feels bettter no cp or sob  Objective:   Blood pressure 129/87, pulse 75, temperature 97.7 F (36.5 C), temperature source Oral, resp. rate 18, height 5\' 6"  (1.676 m), weight 221 lb 8 oz (100.5 kg), SpO2 98 %.  .  Intake/Output Summary (Last 24 hours) at 08/13/16 1333 Last data filed at 08/13/16 1200  Gross per 24 hour  Intake          2156.67 ml  Output             1450 ml  Net           706.67 ml    Exam VITAL SIGNS: Blood pressure 129/87, pulse 75, temperature 97.7 F (36.5 C), temperature source Oral, resp. rate  18, height 5\' 6"  (1.676 m), weight 221 lb 8 oz (100.5 kg), SpO2 98 %.  GENERAL:  46 y.o.-year-old patient lying in the bed with no acute distress.  EYES: Pupils equal, round, reactive to light and accommodation. No scleral icterus. Extraocular muscles intact.  HEENT: Head atraumatic, normocephalic. Oropharynx and nasopharynx clear.  NECK:  Supple, no jugular venous distention. No thyroid enlargement, no tenderness.  LUNGS: Normal breath sounds bilaterally, no wheezing, rales,rhonchi or crepitation. No use of accessory muscles of respiration.  CARDIOVASCULAR: S1, S2 normal. No murmurs, rubs, or gallops.  ABDOMEN: Soft, nontender, nondistended. Bowel sounds present. No organomegaly or mass.  EXTREMITIES: No pedal edema, cyanosis, or clubbing.  NEUROLOGIC: Cranial nerves II through XII are intact. Muscle strength 5/5 in all extremities. Sensation intact. Gait not checked.  PSYCHIATRIC: The patient is alert and oriented x 3.  SKIN: No obvious rash, lesion, or ulcer.   Data Review     CBC w Diff: Lab Results  Component Value Date   WBC 11.0 (H) 08/13/2016   HGB 14.6 08/13/2016   HGB 16.4 10/16/2014   HCT 41.6 08/13/2016   HCT 48.7 10/16/2014   PLT 208 08/13/2016   PLT 219 10/16/2014   LYMPHOPCT 25 05/21/2016   LYMPHOPCT 43.8 10/16/2014   MONOPCT 9  05/21/2016   MONOPCT 7.9 10/16/2014   EOSPCT 2 05/21/2016   EOSPCT 1.4 10/16/2014   BASOPCT 1 05/21/2016   BASOPCT 0.7 10/16/2014   CMP: Lab Results  Component Value Date   NA 134 (L) 08/13/2016   NA 133 (L) 10/16/2014   K 3.8 08/13/2016   K 3.7 10/16/2014   CL 100 (L) 08/13/2016   CL 104 10/16/2014   CO2 27 08/13/2016   CO2 22 10/16/2014   BUN 16 08/13/2016   BUN 17 10/16/2014   CREATININE 1.11 08/13/2016   CREATININE 1.63 (H) 10/16/2014   PROT 5.3 (L) 05/22/2016   PROT 7.9 10/16/2014   ALBUMIN 3.1 (L) 05/22/2016   ALBUMIN 4.7 10/16/2014   BILITOT 0.3 05/22/2016   BILITOT 0.4 10/16/2014   ALKPHOS 61 05/22/2016   ALKPHOS  85 10/16/2014   AST 17 05/22/2016   AST 23 10/16/2014   ALT 14 (L) 05/22/2016   ALT 23 10/16/2014  .  Micro Results No results found for this or any previous visit (from the past 240 hour(s)).      Code Status Orders        Start     Ordered   08/12/16 2318  Full code  Continuous     08/12/16 2317    Code Status History    Date Active Date Inactive Code Status Order ID Comments User Context   05/22/2016 12:01 AM 05/27/2016 11:29 PM Full Code [id number]  Marin Olp, MD ED   05/04/2016 11:59 PM 05/06/2016  8:19 PM Full Code [id number]  Oralia Manis, MD Inpatient   09/26/2015 10:31 AM 09/28/2015  9:48 PM Full Code [id number]  Arnaldo Natal, MD Inpatient          Follow-up Information    AYCOCK, NGWE A, MD Follow up in 7 day(s).   Specialty:  Family Medicine Why:  Please call to schedule a follow-up appointment Contact information: [address] [place] [address] [phone]           Discharge Medications   Allergies as of 08/13/2016   No Known Allergies     Medication List    TAKE these medications   atorvastatin 20 MG tablet Commonly known as:  LIPITOR Take 1 tablet (20 mg total) by mouth daily. Notes to patient:  Last dose was given on Sunday 08/13/16 at 8:33 am   hydrOXYzine 25 MG tablet Commonly known as:  ATARAX/VISTARIL Take 1 tablet (25 mg total) by mouth 3 (three) times daily. Notes to patient:  Last dose was given on Sunday 08/13/16 at 8:38 am   insulin glargine 100 UNIT/ML injection Commonly known as:  LANTUS Inject 0.24 mLs (24 Units total) into the skin at bedtime. What changed:  how much to take   lisinopril 10 MG tablet Commonly known as:  PRINIVIL,ZESTRIL Take 1 tablet (10 mg total) by mouth daily. Notes to patient:  Last dose was given on Sunday 08/13/16 at 8:34 am   metFORMIN 1000 MG tablet Commonly known as:  GLUCOPHAGE Take 1 tablet (1,000 mg total) by mouth 2 (two) times daily with a meal.    oxyCODONE-acetaminophen 5-325 MG tablet Commonly known as:  ROXICET Take 1 tablet by mouth every 6 (six) hours as needed for severe pain.   PARoxetine 30 MG tablet Commonly known as:  PAXIL Take 1 tablet (30 mg total) by mouth daily. Notes to patient:  Last dose was given on Sunday 08/13/16 at 8:34 am   propranolol 80 MG  tablet Commonly known as:  INDERAL Take 1 tablet (80 mg total) by mouth 2 (two) times daily. Notes to patient:  Last dose was given on Sunday 08/13/16 at 8:34 am          Total Time in preparing paper work, data evaluation and todays exam - 35 minutes  Auburn BilberryPATEL, Porcia Morganti M.D on 08/13/2016 at 1:33 PM  [facility]   Office  [phone]

## 2016-08-13 NOTE — Care Management Note (Signed)
Case Management Note  Patient Details  Name: Charletta CousinJeffrey D Rasheed MRN: [id number] Date of Birth: 1970/12/20  Subjective/Objective:  Mr Yetta BarreJones was provided with a coupon and list of participating pharmacies [facility] assistance. He was also provided with a list of local clinics who serve the uninsured, and applications to the [facility] and the [facility].Mr Yetta BarreJones was strongly encouraged to follow up with one of the local clinics.                    Action/Plan:   Expected Discharge Date:  08/13/16               Expected Discharge Plan:     In-House Referral:     Discharge planning Services     Post Acute Care Choice:    Choice offered to:     DME Arranged:    DME Agency:     HH Arranged:    HH Agency:     Status of Service:     If discussed at [employer] Meetings, dates discussed:    Additional Comments:  Dwyne Hasegawa A, RN 08/13/2016, 10:34 AM

## 2016-08-13 NOTE — Progress Notes (Signed)
Pt d/c home; d/c instructions reviewed w/ pt; pt understanding was verbalized; IV removed, catheter in tact, gauze dressing applied; all pt questions answered; pt verbalized that all pt belongings were accounted for; pt left unit via wheelchair accompanied by staff 

## 2016-08-14 LAB — GLUCOSE, CAPILLARY: Glucose-Capillary: >600 mg/dL (ref 65–99)

## 2016-08-18 ENCOUNTER — Telehealth: Payer: Self-pay

## 2016-08-18 NOTE — Telephone Encounter (Signed)
Pt signed for certified letter on 08/15/16.

## 2016-08-22 ENCOUNTER — Ambulatory Visit
Admission: RE | Admit: 2016-08-22 | Discharge: 2016-08-22 | Disposition: A | Discharge status: Home / Self Care | Payer: Self-pay | Source: Ambulatory Visit | Attending: Urology | Admitting: Urology

## 2016-08-22 ENCOUNTER — Ambulatory Visit (INDEPENDENT_AMBULATORY_CARE_PROVIDER_SITE_OTHER): Payer: Self-pay | Admitting: Urology

## 2016-08-22 ENCOUNTER — Encounter: Payer: Self-pay | Admitting: Urology

## 2016-08-22 VITALS — BP 119/77 | HR 85 | Ht 66.0 in | Wt 233.0 lb

## 2016-08-22 DIAGNOSIS — N132 Hydronephrosis with renal and ureteral calculous obstruction: Secondary | ICD-10-CM

## 2016-08-22 DIAGNOSIS — N200 Calculus of kidney: Secondary | ICD-10-CM

## 2016-08-22 NOTE — Progress Notes (Signed)
08/22/2016 7:57 AM   Charletta Cousin 05/23/1971 [id number]  Referring provider: Emogene Morgan, MD [address] [place], [place] [address]  Chief Complaint  Patient presents with  . Results    renal u/s results    HPI: 46 year old male who initially presented with an obstructing left ureteral stone 04/2016, but I hypotension who underwent urgent ureteral stent placement. Return with stent pain and stent dysfunction and was admitted for prolonged admission awaiting ureteroscopy while his urine drug screen cleared. Finally, he passed the stone spontaneously and the stent was removed.  Follow-up renal ultrasound today shows no residual hydroureteronephrosis.  There shadowing in the left kidney measuring 7 mm, in comparison to previous CT scan, unlikely to represent large stone.  He denies any flank pain or gross hematuria. No urinary symptoms.  Stone analysis consistent with 90% calcium oxalate monohydrate, 5% calcium oxalate dihydrate, and 5% calcium phosphate.  Since his GU admission, he has been admitted with DKA, poorly controlled blood pressure, OTHER multiple medical comorbidities.  PMH: Past Medical History:  Diagnosis Date  . Anxiety   . Depressed   . Diabetes mellitus without complication (HCC)   . Heartburn   . HLD (hyperlipidemia)   . Hypertension   . Kidney stone   . Migraines   . Sleep apnea     Surgical History: Past Surgical History:  Procedure Laterality Date  . CYSTOSCOPY W/ URETERAL STENT REMOVAL N/A 05/27/2016   Procedure: CYSTOSCOPY WITH STENT REMOVAL;  Surgeon: Marin Olp, MD;  Location: [facility];  Service: Urology;  Laterality: N/A;  . CYSTOSCOPY WITH STENT PLACEMENT Left 05/05/2016   Procedure: CYSTOSCOPY WITH STENT PLACEMENT;  Surgeon: Vanna Scotland, MD;  Location: [facility] OR[facility];  Service: Urology;  Laterality: Left;  . HERNIA REPAIR    . KNEE SURGERY Left 2009    Home Medications:  Allergies as of 08/22/2016   No Known Allergies       Medication List       Accurate as of 08/22/16 11:59 PM. Always use your most recent med list.          atorvastatin 20 MG tablet Commonly known as:  LIPITOR Take 1 tablet (20 mg total) by mouth daily.   hydrOXYzine 25 MG tablet Commonly known as:  ATARAX/VISTARIL Take 1 tablet (25 mg total) by mouth 3 (three) times daily.   insulin glargine 100 UNIT/ML injection Commonly known as:  LANTUS Inject 0.24 mLs (24 Units total) into the skin at bedtime.   lisinopril 10 MG tablet Commonly known as:  PRINIVIL,ZESTRIL Take 1 tablet (10 mg total) by mouth daily.   metFORMIN 1000 MG tablet Commonly known as:  GLUCOPHAGE Take 1 tablet (1,000 mg total) by mouth 2 (two) times daily with a meal.   PARoxetine 30 MG tablet Commonly known as:  PAXIL Take 1 tablet (30 mg total) by mouth daily.   propranolol 80 MG tablet Commonly known as:  INDERAL Take 1 tablet (80 mg total) by mouth 2 (two) times daily.       Allergies: No Known Allergies  Family History: Family History  Problem Relation Age of Onset  . Diabetes Mellitus II Father   . Prostate cancer Neg Hx   . Kidney disease Neg Hx   . Bladder Cancer Neg Hx     Social History:  reports that he quit smoking about 16 years ago. He has never used smokeless tobacco. He reports that he does not drink alcohol or use drugs.  ROS: UROLOGY Frequent Urination?: No Hard to postpone urination?: No Burning/pain with urination?: No Get up at night to urinate?: No Leakage of urine?: No Urine stream starts and stops?: No Trouble starting stream?: No Do you have to strain to urinate?: No Blood in urine?: No Urinary tract infection?: No Sexually transmitted disease?: No Injury to kidneys or bladder?: No Painful intercourse?: No Weak stream?: No Erection problems?: No Penile pain?: No  Gastrointestinal Nausea?: No Vomiting?: No Indigestion/heartburn?: No Diarrhea?: No Constipation?: No  Constitutional Fever: No Night  sweats?: No Weight loss?: No Fatigue?: No  Skin Skin rash/lesions?: No Itching?: No  Eyes Blurred vision?: Yes Double vision?: No  Ears/Nose/Throat Sore throat?: No Sinus problems?: No  Hematologic/Lymphatic Swollen glands?: No Easy bruising?: No  Cardiovascular Leg swelling?: No Chest pain?: No  Respiratory Cough?: No Shortness of breath?: No  Endocrine Excessive thirst?: No  Musculoskeletal Back pain?: No Joint pain?: No  Neurological Headaches?: No Dizziness?: No  Psychologic Depression?: No Anxiety?: No  Physical Exam: BP 119/77   Pulse 85   Ht 5\' 6"  (1.676 m)   Wt 233 lb (105.7 kg)   BMI 37.61 kg/m   Constitutional:  Alert and oriented, No acute distress. HEENT: [place] AT, moist mucus membranes.  Trachea midline, no masses. Cardiovascular: No clubbing, cyanosis, or edema. Respiratory: Normal respiratory effort, no increased work of breathing. GI: Abdomen is soft, nontender, nondistended, no abdominal masses GU: No CVA tenderness.  Skin: No rashes, bruises or suspicious lesions. Neurologic: Grossly intact, no focal deficits, moving all 4 extremities. Psychiatric: Normal mood and affect.  Laboratory Data: Lab Results  Component Value Date   WBC 11.0 (H) 08/13/2016   HGB 14.6 08/13/2016   HCT 41.6 08/13/2016   MCV 86.2 08/13/2016   PLT 208 08/13/2016    Lab Results  Component Value Date   CREATININE 1.11 08/13/2016     Lab Results  Component Value Date   HGBA1C 16.0 (H) 09/26/2015    Urinalysis N/a  Pertinent Imaging: CLINICAL DATA:  Post ureteroscopy, ureteral stent removed on 05/27/2016  EXAM: RENAL / URINARY TRACT ULTRASOUND COMPLETE  COMPARISON:  CT abdomen pelvis of 05/21/2016  FINDINGS: Right Kidney:  Length: 11.4 cm.  No hydronephrosis is seen.  Left Kidney:  Length: 11.5 cm. No hydronephrosis is noted. A 7 mm calculus is noted in the mid lower pole.  Bladder:  The urinary bladder is unremarkable and  bilateral ureteral jets are visualized.  IMPRESSION: 1. No hydronephrosis. 2. 7 mm nonobstructing left renal calculus. 3. Bilateral ureteral jets are visualized.   Electronically Signed   By: Dwyane DeePaul  Barry M.D.   On: 08/22/2016 15:26  RUS reviewed personally today with the patient. This was compared to previous CT scan and KUB.  Assessment & Plan:    1. Kidney stones RUS results reviewed, no residual hydronephrosis Do no suspect large nonobstructing stone based on previous CT scan for comparison but will continue to follow Stone analysis reviewed  We discussed general stone prevention techniques including drinking plenty water with goal of producing 2.5 L urine daily, increased citric acid intake, avoidance of high oxalate containing foods, and decreased salt intake.  Information about dietary recommendations given today.    Return in about 1 year (around 08/22/2017) for KUB.  Vanna ScotlandAshley Amira Podolak, MD  [facility] [address], [address] [place], [place] [address] [phone]

## 2016-08-22 NOTE — Patient Instructions (Signed)
Dietary Guidelines to Help Prevent Kidney Stones Kidney stones are deposits of minerals and salts that form inside your kidneys. Your risk of developing kidney stones may be greater depending on your diet, your lifestyle, the medicines you take, and whether you have certain medical conditions. Most people can reduce their chances of developing kidney stones by following the instructions below. Depending on your overall health and the type of kidney stones you tend to develop, your dietitian may give you more specific instructions. What are tips for following this plan? Reading food labels   Choose foods with "no salt added" or "low-salt" labels. Limit your sodium intake to less than 1500 mg per day.  Choose foods with calcium for each meal and snack. Try to eat about 300 mg of calcium at each meal. Foods that contain 200-500 mg of calcium per serving include:  8 oz (237 ml) of milk, fortified nondairy milk, and fortified fruit juice.  8 oz (237 ml) of kefir, yogurt, and soy yogurt.  4 oz (118 ml) of tofu.  1 oz of cheese.  1 cup (300 g) of dried figs.  1 cup (91 g) of cooked broccoli.  1-3 oz can of sardines or mackerel.  Most people need 1000 to 1500 mg of calcium each day. Talk to your dietitian about how much calcium is recommended for you. Shopping   Buy plenty of fresh fruits and vegetables. Most people do not need to avoid fruits and vegetables, even if they contain nutrients that may contribute to kidney stones.  When shopping for convenience foods, choose:  Whole pieces of fruit.  Premade salads with dressing on the side.  Low-fat fruit and yogurt smoothies.  Avoid buying frozen meals or prepared deli foods.  Look for foods with live cultures, such as yogurt and kefir. Cooking   Do not add salt to food when cooking. Place a salt shaker on the table and allow each person to add his or her own salt to taste.  Use vegetable protein, such as beans, textured vegetable  protein (TVP), or tofu instead of meat in pasta, casseroles, and soups. Meal planning   Eat less salt, if told by your dietitian. To do this:  Avoid eating processed or premade food.  Avoid eating fast food.  Eat less animal protein, including cheese, meat, poultry, or fish, if told by your dietitian. To do this:  Limit the number of times you have meat, poultry, fish, or cheese each week. Eat a diet free of meat at least 2 days a week.  Eat only one serving each day of meat, poultry, fish, or seafood.  When you prepare animal protein, cut pieces into small portion sizes. For most meat and fish, one serving is about the size of one deck of cards.  Eat at least 5 servings of fresh fruits and vegetables each day. To do this:  Keep fruits and vegetables on hand for snacks.  Eat 1 piece of fruit or a handful of berries with breakfast.  Have a salad and fruit at lunch.  Have two kinds of vegetables at dinner.  Limit foods that are high in a substance called oxalate. These include:  Spinach.  Rhubarb.  Beets.  Potato chips and french fries.  Nuts.  If you regularly take a diuretic medicine, make sure to eat at least 1-2 fruits or vegetables high in potassium each day. These include:  Avocado.  Banana.  Orange, prune, carrot, or tomato juice.  Baked potato.  Cabbage.    Beans and split peas. General instructions   Drink enough fluid to keep your urine clear or pale yellow. This is the most important thing you can do.  Talk to your health care provider and dietitian about taking daily supplements. Depending on your health and the cause of your kidney stones, you may be advised:  Not to take supplements with vitamin C.  To take a calcium supplement.  To take a daily probiotic supplement.  To take other supplements such as magnesium, fish oil, or vitamin B6.  Take all medicines and supplements as told by your health care provider.  Limit alcohol intake to no  more than 1 drink a day for nonpregnant women and 2 drinks a day for men. One drink equals 12 oz of beer, 5 oz of wine, or 1 oz of hard liquor.  Lose weight if told by your health care provider. Work with your dietitian to find strategies and an eating plan that works best for you. What foods are not recommended? Limit your intake of the following foods, or as told by your dietitian. Talk to your dietitian about specific foods you should avoid based on the type of kidney stones and your overall health. Grains  Breads. Bagels. Rolls. Baked goods. Salted crackers. Cereal. Pasta. Vegetables  Spinach. Rhubarb. Beets. Canned vegetables. Pickles. Olives. Meats and other protein foods  Nuts. Nut butters. Large portions of meat, poultry, or fish. Salted or cured meats. Deli meats. Hot dogs. Sausages. Dairy  Cheese. Beverages  Regular soft drinks. Regular vegetable juice. Seasonings and other foods  Seasoning blends with salt. Salad dressings. Canned soups. Soy sauce. Ketchup. Barbecue sauce. Canned pasta sauce. Casseroles. Pizza. Lasagna. Frozen meals. Potato chips. French fries. Summary  You can reduce your risk of kidney stones by making changes to your diet.  The most important thing you can do is drink enough fluid. You should drink enough fluid to keep your urine clear or pale yellow.  Ask your health care provider or dietitian how much protein from animal sources you should eat each day, and also how much salt and calcium you should have each day. This information is not intended to replace advice given to you by your health care provider. Make sure you discuss any questions you have with your health care provider. Document Released: 09/30/2010 Document Revised: 05/16/2016 Document Reviewed: 05/16/2016 Elsevier Interactive Patient Education  2017 [employer]  

## 2016-11-14 DIAGNOSIS — E785 Hyperlipidemia, unspecified: Secondary | ICD-10-CM | POA: Insufficient documentation

## 2016-11-14 DIAGNOSIS — F329 Major depressive disorder, single episode, unspecified: Secondary | ICD-10-CM | POA: Insufficient documentation

## 2016-11-14 DIAGNOSIS — F419 Anxiety disorder, unspecified: Secondary | ICD-10-CM | POA: Insufficient documentation

## 2016-11-14 DIAGNOSIS — Z808 Family history of malignant neoplasm of other organs or systems: Secondary | ICD-10-CM | POA: Insufficient documentation

## 2016-11-14 DIAGNOSIS — E1165 Type 2 diabetes mellitus with hyperglycemia: Secondary | ICD-10-CM | POA: Insufficient documentation

## 2016-11-14 DIAGNOSIS — N179 Acute kidney failure, unspecified: Secondary | ICD-10-CM | POA: Insufficient documentation

## 2016-11-14 DIAGNOSIS — E871 Hypo-osmolality and hyponatremia: Secondary | ICD-10-CM | POA: Insufficient documentation

## 2016-11-14 DIAGNOSIS — Z794 Long term (current) use of insulin: Secondary | ICD-10-CM | POA: Insufficient documentation

## 2016-11-14 DIAGNOSIS — Z87891 Personal history of nicotine dependence: Secondary | ICD-10-CM | POA: Insufficient documentation

## 2016-11-14 DIAGNOSIS — I10 Essential (primary) hypertension: Secondary | ICD-10-CM | POA: Insufficient documentation

## 2016-11-14 DIAGNOSIS — G4733 Obstructive sleep apnea (adult) (pediatric): Secondary | ICD-10-CM | POA: Insufficient documentation

## 2016-11-14 DIAGNOSIS — E860 Dehydration: Principal | ICD-10-CM | POA: Insufficient documentation

## 2016-11-14 DIAGNOSIS — Z833 Family history of diabetes mellitus: Secondary | ICD-10-CM | POA: Insufficient documentation

## 2016-11-14 DIAGNOSIS — Z79899 Other long term (current) drug therapy: Secondary | ICD-10-CM | POA: Insufficient documentation

## 2016-11-14 LAB — URINALYSIS, COMPLETE (UACMP) WITH MICROSCOPIC
Bacteria, UA: NONE SEEN
Bilirubin Urine: NEGATIVE
HGB URINE DIPSTICK: NEGATIVE
KETONES UR: NEGATIVE mg/dL
Leukocytes, UA: NEGATIVE
Nitrite: NEGATIVE
PROTEIN: NEGATIVE mg/dL
RBC / HPF: NONE SEEN RBC/hpf (ref 0–5)
Specific Gravity, Urine: 1.027 (ref 1.005–1.030)
Squamous Epithelial / LPF: NONE SEEN
pH: 5 (ref 5.0–8.0)

## 2016-11-14 LAB — BASIC METABOLIC PANEL
ANION GAP: 10 (ref 5–15)
BUN: 22 mg/dL — ABNORMAL HIGH (ref 6–20)
CALCIUM: 9.4 mg/dL (ref 8.9–10.3)
CO2: 22 mmol/L (ref 22–32)
Chloride: 96 mmol/L — ABNORMAL LOW (ref 101–111)
Creatinine, Ser: 1.97 mg/dL — ABNORMAL HIGH (ref 0.61–1.24)
GFR calc non Af Amer: 39 mL/min — ABNORMAL LOW (ref >60)
GFR, EST AFRICAN AMERICAN: 45 mL/min — AB (ref >60)
GLUCOSE: 496 mg/dL — AB (ref 65–99)
POTASSIUM: 4.2 mmol/L (ref 3.5–5.1)
Sodium: 128 mmol/L — ABNORMAL LOW (ref 135–145)

## 2016-11-14 LAB — GLUCOSE, CAPILLARY: GLUCOSE-CAPILLARY: 534 mg/dL — AB (ref 65–99)

## 2016-11-14 LAB — CBC
HEMATOCRIT: 46.3 % (ref 40.0–52.0)
HEMOGLOBIN: 16.1 g/dL (ref 13.0–18.0)
MCH: 31 pg (ref 26.0–34.0)
MCHC: 34.9 g/dL (ref 32.0–36.0)
MCV: 88.8 fL (ref 80.0–100.0)
Platelets: 246 10*3/uL (ref 150–440)
RBC: 5.22 MIL/uL (ref 4.40–5.90)
RDW: 13.8 % (ref 11.5–14.5)
WBC: 8.6 10*3/uL (ref 3.8–10.6)

## 2016-11-14 NOTE — ED Triage Notes (Addendum)
Pt presents to ED with c/o HYPERglycemia. Pt reports checking his CBG level at 830pm and the meter read "HIGH" which he reports means >600mg /dL. Pt reports compliance with diabetes medication as prescribed. Pt also c/o bilateral foot pain (10/10) d/t diabetic neuropathy. Pt currently denies any c/o N/V.

## 2016-11-15 ENCOUNTER — Encounter: Payer: Self-pay | Admitting: Internal Medicine

## 2016-11-15 ENCOUNTER — Observation Stay
Admission: EM | Admit: 2016-11-15 | Discharge: 2016-11-16 | Disposition: A | Discharge status: Home / Self Care | Payer: Self-pay | Attending: Internal Medicine | Admitting: Internal Medicine

## 2016-11-15 DIAGNOSIS — R739 Hyperglycemia, unspecified: Secondary | ICD-10-CM

## 2016-11-15 DIAGNOSIS — N179 Acute kidney failure, unspecified: Secondary | ICD-10-CM

## 2016-11-15 DIAGNOSIS — E860 Dehydration: Secondary | ICD-10-CM

## 2016-11-15 DIAGNOSIS — E871 Hypo-osmolality and hyponatremia: Secondary | ICD-10-CM | POA: Diagnosis present

## 2016-11-15 LAB — CBC
HEMATOCRIT: 41.4 % (ref 40.0–52.0)
HEMOGLOBIN: 14.7 g/dL (ref 13.0–18.0)
MCH: 31.2 pg (ref 26.0–34.0)
MCHC: 35.4 g/dL (ref 32.0–36.0)
MCV: 88.1 fL (ref 80.0–100.0)
Platelets: 216 10*3/uL (ref 150–440)
RBC: 4.7 MIL/uL (ref 4.40–5.90)
RDW: 13.7 % (ref 11.5–14.5)
WBC: 8.7 10*3/uL (ref 3.8–10.6)

## 2016-11-15 LAB — BASIC METABOLIC PANEL
Anion gap: 7 (ref 5–15)
BUN: 20 mg/dL (ref 6–20)
CHLORIDE: 100 mmol/L — AB (ref 101–111)
CO2: 29 mmol/L (ref 22–32)
CREATININE: 1.39 mg/dL — AB (ref 0.61–1.24)
Calcium: 8.5 mg/dL — ABNORMAL LOW (ref 8.9–10.3)
GFR calc Af Amer: >60 mL/min (ref >60)
GFR calc non Af Amer: 59 mL/min — ABNORMAL LOW (ref >60)
GLUCOSE: 153 mg/dL — AB (ref 65–99)
Potassium: 3.5 mmol/L (ref 3.5–5.1)
Sodium: 136 mmol/L (ref 135–145)

## 2016-11-15 LAB — URINE DRUG SCREEN, QUALITATIVE (ARMC ONLY)
AMPHETAMINES, UR SCREEN: NOT DETECTED
BARBITURATES, UR SCREEN: NOT DETECTED
BENZODIAZEPINE, UR SCRN: NOT DETECTED
Cannabinoid 50 Ng, Ur ~~LOC~~: NOT DETECTED
Cocaine Metabolite,Ur ~~LOC~~: NOT DETECTED
MDMA (Ecstasy)Ur Screen: NOT DETECTED
Methadone Scn, Ur: NOT DETECTED
OPIATE, UR SCREEN: NOT DETECTED
PHENCYCLIDINE (PCP) UR S: NOT DETECTED
Tricyclic, Ur Screen: NOT DETECTED

## 2016-11-15 LAB — GLUCOSE, CAPILLARY
GLUCOSE-CAPILLARY: 234 mg/dL — AB (ref 65–99)
Glucose-Capillary: 171 mg/dL — ABNORMAL HIGH (ref 65–99)
Glucose-Capillary: 196 mg/dL — ABNORMAL HIGH (ref 65–99)
Glucose-Capillary: 302 mg/dL — ABNORMAL HIGH (ref 65–99)
Glucose-Capillary: 280 mg/dL — ABNORMAL HIGH (ref 65–99)

## 2016-11-15 LAB — CK: CK TOTAL: 61 U/L (ref 49–397)

## 2016-11-15 MED ORDER — LISINOPRIL 10 MG PO TABS
10.0000 mg | ORAL_TABLET | Freq: Every day | ORAL | Status: DC
  Administered 2016-11-15 – 2016-11-16 (×2): 10 mg via ORAL
  Filled 2016-11-15 (×2): qty 1

## 2016-11-15 MED ORDER — ATORVASTATIN CALCIUM 20 MG PO TABS
20.0000 mg | ORAL_TABLET | Freq: Every day | ORAL | Status: DC
  Administered 2016-11-15 – 2016-11-16 (×2): 20 mg via ORAL
  Filled 2016-11-15 (×2): qty 1

## 2016-11-15 MED ORDER — HYDROCODONE-ACETAMINOPHEN 5-325 MG PO TABS
1.0000 | ORAL_TABLET | ORAL | Status: DC | PRN
  Administered 2016-11-15 (×4): 1 via ORAL
  Filled 2016-11-15 (×4): qty 1

## 2016-11-15 MED ORDER — SENNOSIDES-DOCUSATE SODIUM 8.6-50 MG PO TABS: 1.0000 | ORAL_TABLET | Freq: Every evening | ORAL | Status: DC | PRN

## 2016-11-15 MED ORDER — ACETAMINOPHEN 325 MG PO TABS
650.0000 mg | ORAL_TABLET | Freq: Four times a day (QID) | ORAL | Status: DC | PRN
  Administered 2016-11-16: 650 mg via ORAL
  Filled 2016-11-15: qty 2

## 2016-11-15 MED ORDER — SODIUM CHLORIDE 0.9 % IV BOLUS (SEPSIS)
1000.0000 mL | Freq: Once | INTRAVENOUS | Status: AC
  Administered 2016-11-15: 1000 mL via INTRAVENOUS

## 2016-11-15 MED ORDER — PROPRANOLOL HCL 40 MG PO TABS
80.0000 mg | ORAL_TABLET | Freq: Two times a day (BID) | ORAL | Status: DC
  Administered 2016-11-15 (×2): 80 mg via ORAL
  Filled 2016-11-15: qty 1
  Filled 2016-11-15 (×3): qty 2

## 2016-11-15 MED ORDER — METFORMIN HCL 500 MG PO TABS
1000.0000 mg | ORAL_TABLET | Freq: Two times a day (BID) | ORAL | Status: DC
  Administered 2016-11-15 – 2016-11-16 (×3): 1000 mg via ORAL
  Filled 2016-11-15 (×3): qty 2

## 2016-11-15 MED ORDER — ONDANSETRON HCL 4 MG PO TABS
4.0000 mg | ORAL_TABLET | Freq: Four times a day (QID) | ORAL | Status: DC | PRN
  Administered 2016-11-15: 09:00:00 4 mg via ORAL
  Filled 2016-11-15: qty 1

## 2016-11-15 MED ORDER — PAROXETINE HCL 20 MG PO TABS
30.0000 mg | ORAL_TABLET | Freq: Every day | ORAL | Status: DC
  Administered 2016-11-15 – 2016-11-16 (×2): 30 mg via ORAL
  Filled 2016-11-15 (×2): qty 2

## 2016-11-15 MED ORDER — SODIUM CHLORIDE 0.9 % IV SOLN
INTRAVENOUS | Status: DC
  Administered 2016-11-15 (×2): via INTRAVENOUS

## 2016-11-15 MED ORDER — INSULIN GLARGINE 100 UNIT/ML ~~LOC~~ SOLN
24.0000 [IU] | Freq: Every day | SUBCUTANEOUS | Status: DC
  Filled 2016-11-15: qty 0.24

## 2016-11-15 MED ORDER — INSULIN GLARGINE 100 UNIT/ML ~~LOC~~ SOLN
50.0000 [IU] | Freq: Every day | SUBCUTANEOUS | Status: DC
  Administered 2016-11-15: 50 [IU] via SUBCUTANEOUS
  Filled 2016-11-15 (×2): qty 0.5

## 2016-11-15 MED ORDER — HYDRALAZINE HCL 20 MG/ML IJ SOLN
10.0000 mg | Freq: Once | INTRAMUSCULAR | Status: DC
  Filled 2016-11-15: qty 1

## 2016-11-15 MED ORDER — INSULIN ASPART 100 UNIT/ML ~~LOC~~ SOLN
10.0000 [IU] | Freq: Once | SUBCUTANEOUS | Status: AC
  Administered 2016-11-15: 10 [IU] via INTRAVENOUS
  Filled 2016-11-15: qty 10

## 2016-11-15 MED ORDER — HEPARIN SODIUM (PORCINE) 5000 UNIT/ML IJ SOLN
5000.0000 [IU] | Freq: Three times a day (TID) | INTRAMUSCULAR | Status: DC
  Administered 2016-11-15 – 2016-11-16 (×3): 5000 [IU] via SUBCUTANEOUS
  Filled 2016-11-15 (×3): qty 1

## 2016-11-15 MED ORDER — ACETAMINOPHEN 650 MG RE SUPP: 650.0000 mg | Freq: Four times a day (QID) | RECTAL | Status: DC | PRN

## 2016-11-15 MED ORDER — INSULIN ASPART 100 UNIT/ML ~~LOC~~ SOLN
0.0000 [IU] | Freq: Every day | SUBCUTANEOUS | Status: DC
Start: 2016-11-15 — End: 2016-11-16
  Administered 2016-11-15: 2 [IU] via SUBCUTANEOUS
  Filled 2016-11-15: qty 2

## 2016-11-15 MED ORDER — MORPHINE SULFATE (PF) 4 MG/ML IV SOLN
4.0000 mg | Freq: Once | INTRAVENOUS | Status: AC
  Administered 2016-11-15: 4 mg via INTRAVENOUS
  Filled 2016-11-15: qty 1

## 2016-11-15 MED ORDER — ONDANSETRON HCL 4 MG/2ML IJ SOLN: 4.0000 mg | Freq: Four times a day (QID) | INTRAMUSCULAR | Status: DC | PRN

## 2016-11-15 MED ORDER — OXYCODONE-ACETAMINOPHEN 5-325 MG PO TABS
1.0000 | ORAL_TABLET | Freq: Once | ORAL | Status: AC
  Administered 2016-11-15: 1 via ORAL
  Filled 2016-11-15: qty 1

## 2016-11-15 MED ORDER — INSULIN ASPART 100 UNIT/ML ~~LOC~~ SOLN
0.0000 [IU] | Freq: Three times a day (TID) | SUBCUTANEOUS | Status: DC
  Administered 2016-11-15: 09:00:00 4 [IU] via SUBCUTANEOUS
  Administered 2016-11-15: 17:00:00 11 [IU] via SUBCUTANEOUS
  Administered 2016-11-15: 12:00:00 15 [IU] via SUBCUTANEOUS
  Administered 2016-11-16: 09:00:00 4 [IU] via SUBCUTANEOUS
  Filled 2016-11-15: qty 11
  Filled 2016-11-15: qty 4
  Filled 2016-11-15: qty 15
  Filled 2016-11-15: qty 4

## 2016-11-15 NOTE — Care Management (Signed)
Admitted to this facility under observation with the diagnosis of hypoglycemia. Lives with mother, ruth. [phone]). [facility]gwe [facility]. Takes care of all basic activities of daily living himself. Given [facility] and Medication Management information 05/2016 and 07/2016. [facility]. Gwenette GreetBrenda S Nickoli Bagheri RN MSN CCM Care Management [phone]

## 2016-11-15 NOTE — Progress Notes (Signed)
Results for Charletta CousinJONES, Jermanie D (MRN [id number]) as of 11/15/2016 13:19  Ref. Range 11/15/2016 07:21 11/15/2016 11:43  Glucose-Capillary Latest Ref Range: 65 - 99 mg/dL 147196 (H) 829302 (H)    Home DM Meds: Lantus 80 units QHS                             Metformin 1000 mg BID  Current Insulin Orders: Lantus 24 units QHS                                       Novolog Resistant Correction Scale/ SSI (0-20 units) TID AC + HS                                       Metformin 1000 mg BID     MD- Spoke with patient today.  States he takes Lantus 80 units QHS at home along with Metformin 1000 mg BID.  Note current Lantus dose is 24 units QHS scheduled to be given tonight.    MD- After speaking with pt today (see below for conversation details), it appears pt has very high CBGs at home.  Perhaps pt needs a Novolog SSI regimen that he can use at home to better control CBGs?  Please consider discharging patient home on Novolog as well.    After speaking with patient today, patient told me his CBGs have been running very high at home lately.  Only checks 2-3 times per day and sometimes does not check at all.  Has CBG meter and supplies and [facility]  I asked patient is he has ever taken rapid-acting insulin at home for high blood sugars or food coverage and pt stated he has not. Patient told me he doesn't understand why the 80 units of Lantus don't control his CBGs.  Explained to pt what Lantus is and how it works and that Lantus is not an insulin you would use to bring a high CBG down.  Also explained to pt that the longer you have had diabetes the pancreas may produce less insulin over time.  Explained to pt that he may need to intensify his insulin regimen at home in order to better control his CBGs.  Patient stated he is willing to take more shots of insulin per day if needed.    --Will follow patient during hospitalization--  Ambrose FinlandJeannine Johnston Sabriah Hobbins RN, MSN, CDE Diabetes  Coordinator Inpatient Glycemic Control Team Team Pager: [phone] (8a-5p)

## 2016-11-15 NOTE — ED Notes (Signed)
Patient states he has pain across his chest. "i think its from the IV." this RN did EKG and given to EDP

## 2016-11-15 NOTE — H&P (Signed)
[facility] [place] [facility] NAME: Dustin Williamson    MR#:  [id number]  DATE OF BIRTH:  Jan 15, 1971  DATE OF ADMISSION:  11/15/2016  PRIMARY CARE PHYSICIAN: Donnie Coffin, MD   REQUESTING/REFERRING PHYSICIAN:   CHIEF COMPLAINT:   Chief Complaint  Patient presents with  . Hyperglycemia    HISTORY OF PRESENT ILLNESS: Dustin Williamson  is a 46 y.o. male with a known history of diabetes mellitus type 2, hyperlipidemia, hypertension, sleep apnea, anxiety disorder presented to the emergency room with elevated blood sugar. Patient check blood sugar at home was very high and he came to the emergency room, his blood sugar was more than 500. Complaints of polyuria and polyphagia and polydipsia. No complaints of any chest pain, shortness of breath. No headache dizziness and blurry vision. No complaints of nausea and vomiting. Patient blood sugar was controlled in the emergency room and his anion gap was normal. He appeared dry and dehydrated and sodium level was low. Hospitalist service was consulted for further care of the patient.  PAST MEDICAL HISTORY:   Past Medical History:  Diagnosis Date  . Anxiety   . Depressed   . Diabetes mellitus without complication ([place])   . Heartburn   . HLD (hyperlipidemia)   . Hypertension   . Kidney stone   . Migraines   . Sleep apnea     PAST SURGICAL HISTORY: Past Surgical History:  Procedure Laterality Date  . CYSTOSCOPY W/ URETERAL STENT REMOVAL N/A 05/27/2016   Procedure: CYSTOSCOPY WITH STENT REMOVAL;  Surgeon: Darcella Cheshire, MD;  Location: [facility] ORS;  Service: Urology;  Laterality: N/A;  . CYSTOSCOPY WITH STENT PLACEMENT Left 05/05/2016   Procedure: CYSTOSCOPY WITH STENT PLACEMENT;  Surgeon: Hollice Espy, MD;  Location: [facility] ORS;  Service: Urology;  Laterality: Left;  . HERNIA REPAIR    . KNEE SURGERY Left 2009    SOCIAL HISTORY:  Social History  Substance Use Topics  . Smoking status: Former Smoker    Quit  date: 06/19/2000  . Smokeless tobacco: Never Used  . Alcohol use No    FAMILY HISTORY:  Family History  Problem Relation Age of Onset  . Diabetes Mellitus II Father   . Cancer Father   . Multiple myeloma Mother   . Prostate cancer Neg Hx   . Kidney disease Neg Hx   . Bladder Cancer Neg Hx     DRUG ALLERGIES: No Known Allergies  REVIEW OF SYSTEMS:   CONSTITUTIONAL: No fever, fatigue or weakness.  EYES: No blurred or double vision.  EARS, NOSE, AND THROAT: No tinnitus or ear pain.  RESPIRATORY: No cough, shortness of breath, wheezing or hemoptysis.  CARDIOVASCULAR: No chest pain, orthopnea, edema.  GASTROINTESTINAL: No nausea, vomiting, diarrhea or abdominal pain.  GENITOURINARY: No dysuria, hematuria.  ENDOCRINE: Has polyuria, nocturia, polyphagia HEMATOLOGY: No anemia, easy bruising or bleeding SKIN: No rash or lesion. MUSCULOSKELETAL: No joint pain or arthritis.   NEUROLOGIC: No tingling, numbness, weakness.  PSYCHIATRY: No anxiety or depression.   MEDICATIONS AT HOME:  Prior to Admission medications   Medication Sig Start Date End Date Taking? Authorizing Provider  atorvastatin (LIPITOR) 20 MG tablet Take 1 tablet (20 mg total) by mouth daily. 08/13/16  Yes Dustin Flock, MD  hydrOXYzine (ATARAX/VISTARIL) 25 MG tablet Take 1 tablet (25 mg total) by mouth 3 (three) times daily. 08/13/16  Yes Dustin Flock, MD  insulin glargine (LANTUS) 100 UNIT/ML injection Inject 0.24 mLs (24 Units total)  into the skin at bedtime. Patient taking differently: Inject 80 Units into the skin at bedtime.  05/06/16  Yes Wieting, Richard, MD  lisinopril (PRINIVIL,ZESTRIL) 10 MG tablet Take 1 tablet (10 mg total) by mouth daily. 08/13/16  Yes Dustin Flock, MD  metFORMIN (GLUCOPHAGE) 1000 MG tablet Take 1 tablet (1,000 mg total) by mouth 2 (two) times daily with a meal. 08/13/16  Yes Dustin Flock, MD  PARoxetine (PAXIL) 30 MG tablet Take 1 tablet (30 mg total) by mouth daily. 08/13/16  Yes  Dustin Flock, MD  propranolol (INDERAL) 80 MG tablet Take 1 tablet (80 mg total) by mouth 2 (two) times daily. 08/13/16  Yes Dustin Flock, MD      PHYSICAL EXAMINATION:   VITAL SIGNS: Blood pressure (!) 127/98, pulse 63, temperature 97.8 F (36.6 C), temperature source Oral, resp. rate 15, height 5' 7"  (1.702 m), weight 106.6 kg (235 lb), SpO2 97 %.  GENERAL:  46 y.o.-year-old patient lying in the bed with no acute distress.  EYES: Pupils equal, round, reactive to light and accommodation. No scleral icterus. Extraocular muscles intact.  HEENT: Head atraumatic, normocephalic. Oropharynx dry and nasopharynx clear.  NECK:  Supple, no jugular venous distention. No thyroid enlargement, no tenderness.  LUNGS: Normal breath sounds bilaterally, no wheezing, rales,rhonchi or crepitation. No use of accessory muscles of respiration.  CARDIOVASCULAR: S1, S2 normal. No murmurs, rubs, or gallops.  ABDOMEN: Soft, nontender, nondistended. Bowel sounds present. No organomegaly or mass.  EXTREMITIES: No pedal edema, cyanosis, or clubbing.  NEUROLOGIC: Cranial nerves II through XII are intact. Muscle strength 5/5 in all extremities. Sensation intact. Gait not checked.  PSYCHIATRIC: The patient is alert and oriented x 3.  SKIN: No obvious rash, lesion, or ulcer.   LABORATORY PANEL:   CBC  Recent Labs Lab 11/14/16 2112  WBC 8.6  HGB 16.1  HCT 46.3  PLT 246  MCV 88.8  MCH 31.0  MCHC 34.9  RDW 13.8   ------------------------------------------------------------------------------------------------------------------  Chemistries   Recent Labs Lab 11/14/16 2112  NA 128*  K 4.2  CL 96*  CO2 22  GLUCOSE 496*  BUN 22*  CREATININE 1.97*  CALCIUM 9.4   ------------------------------------------------------------------------------------------------------------------ estimated creatinine clearance is 54.5 mL/min (A) (by C-G formula based on SCr of 1.97 mg/dL  (H)). ------------------------------------------------------------------------------------------------------------------ No results for input(s): TSH, T4TOTAL, T3FREE, THYROIDAB in the last 72 hours.  Invalid input(s): FREET3   Coagulation profile No results for input(s): INR, PROTIME in the last 168 hours. ------------------------------------------------------------------------------------------------------------------- No results for input(s): DDIMER in the last 72 hours. -------------------------------------------------------------------------------------------------------------------  Cardiac Enzymes No results for input(s): CKMB, TROPONINI, MYOGLOBIN in the last 168 hours.  Invalid input(s): CK ------------------------------------------------------------------------------------------------------------------ Invalid input(s): POCBNP  ---------------------------------------------------------------------------------------------------------------  Urinalysis    Component Value Date/Time   COLORURINE STRAW (A) 11/14/2016 2112   APPEARANCEUR CLEAR (A) 11/14/2016 2112   APPEARANCEUR Cloudy (A) 05/09/2016 1452   LABSPEC 1.027 11/14/2016 2112   LABSPEC 1.030 10/16/2014 1256   PHURINE 5.0 11/14/2016 2112   GLUCOSEU >=500 (A) 11/14/2016 2112   GLUCOSEU NEGATIVE 10/16/2014 1256   HGBUR NEGATIVE 11/14/2016 2112   BILIRUBINUR NEGATIVE 11/14/2016 2112   BILIRUBINUR Negative 05/09/2016 1452   BILIRUBINUR NEGATIVE 10/16/2014 1256   KETONESUR NEGATIVE 11/14/2016 2112   PROTEINUR NEGATIVE 11/14/2016 2112   UROBILINOGEN 0.2 03/05/2009 1613   NITRITE NEGATIVE 11/14/2016 2112   LEUKOCYTESUR NEGATIVE 11/14/2016 2112   LEUKOCYTESUR Trace (A) 05/09/2016 1452   LEUKOCYTESUR NEGATIVE 10/16/2014 1256     RADIOLOGY: No results found.  EKG: Orders placed  or performed during the hospital encounter of 05/04/16  . EKG 12-Lead  . EKG 12-Lead  . EKG    IMPRESSION AND PLAN: 46 year old  male patient with history of diabetes mellitus, anxiety disorder, hyperlipidemia, hypertension, sleep apnea presented to the emergency room with elevated blood sugar. Admitting diagnosis 1. Hyponatremia 2. Uncontrolled diabetes mellitus 3. Dehydration 4. Acute kidney injury Treatment plan Admit patient to medical floor observation bed IV fluid hydration Control blood sugars with the sliding scale coverage with NovoLog insulin and Lantus insulin Follow renal function DVT prophylaxis subcutaneous heparin Follow-up sodium level  All the records are reviewed and case discussed with ED provider. Management plans discussed with the patient, family and they are in agreement.  CODE STATUS:FULL CODE Code Status History    Date Active Date Inactive Code Status Order ID Comments User Context   08/12/2016 11:17 PM 08/13/2016  2:51 PM Full Code [id number]  Lance Coon, MD ED   05/22/2016 12:01 AM 05/27/2016 11:29 PM Full Code [id number]  Darcella Cheshire, MD ED   05/04/2016 11:59 PM 05/06/2016  8:19 PM Full Code [id number]  Lance Coon, MD Inpatient   09/26/2015 10:31 AM 09/28/2015  9:48 PM Full Code [id number]  Harrie Foreman, MD Inpatient       TOTAL TIME TAKING CARE OF THIS PATIENT: 50 minutes.    Saundra Shelling M.D on 11/15/2016 at 4:06 AM  Between 7am to 6pm - Pager - [phone]  After 6pm go to [email or web address] - password [email or web address] [facility]  [facility]  Office  [phone]  CC: Primary care physician; Donnie Coffin, MD

## 2016-11-15 NOTE — ED Provider Notes (Signed)
[facility] Emergency Department Provider Note   ____________________________________________   First MD Initiated Contact with Patient 11/15/16 0018     (approximate)  I have reviewed the triage vital signs and the nursing notes.   HISTORY  Chief Complaint Hyperglycemia    HPI Dustin Williamson is a 46 y.o. male who presents to the ED from home with a chief complaint of hyperglycemia. Patient is an insulin-dependent diabetic who states his blood sugar has been reading greater than 600 over the past 2 days. States he has been compliant with diet and medications. Symptoms associated with increased thirst and urination. Also complains of increased neuropathic pain in bilateral feet. Denies associated fever, chills, cough, congestion, chest pain, shortness of breath, abdominal pain, nausea, vomiting, diarrhea.Denies recent travel or trauma. Nothing makes his symptoms better or worse.   Past Medical History:  Diagnosis Date  . Anxiety   . Depressed   . Diabetes mellitus without complication (HCC)   . Heartburn   . HLD (hyperlipidemia)   . Hypertension   . Kidney stone   . Migraines   . Sleep apnea     Patient Active Problem List   Diagnosis Date Noted  . OSA (obstructive sleep apnea) 08/12/2016  . HTN (hypertension) 08/12/2016  . Depression 08/12/2016  . Left ureteral stone   . Ureteral obstruction, left 05/22/2016  . Acute left flank pain   . AKI (acute kidney injury) (HCC) 05/04/2016  . Diabetes (HCC) 05/04/2016  . Hypotension 05/04/2016  . Urinary obstruction 05/04/2016  . Hyperglycemia 09/26/2015  . CLOSED FRACTURE OF BASE OF OTHER METACARPAL BONE 03/09/2009    Past Surgical History:  Procedure Laterality Date  . CYSTOSCOPY W/ URETERAL STENT REMOVAL N/A 05/27/2016   Procedure: CYSTOSCOPY WITH STENT REMOVAL;  Surgeon: Marin Olp, MD;  Location: [facility] ORS;  Service: Urology;  Laterality: N/A;  . CYSTOSCOPY WITH STENT PLACEMENT Left 05/05/2016    Procedure: CYSTOSCOPY WITH STENT PLACEMENT;  Surgeon: Vanna Scotland, MD;  Location: [facility] ORS;  Service: Urology;  Laterality: Left;  . HERNIA REPAIR    . KNEE SURGERY Left 2009    Prior to Admission medications   Medication Sig Start Date End Date Taking? Authorizing Provider  atorvastatin (LIPITOR) 20 MG tablet Take 1 tablet (20 mg total) by mouth daily. 08/13/16  Yes Auburn Bilberry, MD  hydrOXYzine (ATARAX/VISTARIL) 25 MG tablet Take 1 tablet (25 mg total) by mouth 3 (three) times daily. 08/13/16  Yes Auburn Bilberry, MD  insulin glargine (LANTUS) 100 UNIT/ML injection Inject 0.24 mLs (24 Units total) into the skin at bedtime. Patient taking differently: Inject 80 Units into the skin at bedtime.  05/06/16  Yes Wieting, Richard, MD  lisinopril (PRINIVIL,ZESTRIL) 10 MG tablet Take 1 tablet (10 mg total) by mouth daily. 08/13/16  Yes Auburn Bilberry, MD  metFORMIN (GLUCOPHAGE) 1000 MG tablet Take 1 tablet (1,000 mg total) by mouth 2 (two) times daily with a meal. 08/13/16  Yes Auburn Bilberry, MD  PARoxetine (PAXIL) 30 MG tablet Take 1 tablet (30 mg total) by mouth daily. 08/13/16  Yes Auburn Bilberry, MD  propranolol (INDERAL) 80 MG tablet Take 1 tablet (80 mg total) by mouth 2 (two) times daily. 08/13/16  Yes Auburn Bilberry, MD    Allergies Patient has no known allergies.  Family History  Problem Relation Age of Onset  . Diabetes Mellitus II Father   . Prostate cancer Neg Hx   . Kidney disease Neg Hx   . Bladder Cancer Neg  Hx     Social History Social History  Substance Use Topics  . Smoking status: Former Smoker    Quit date: 06/19/2000  . Smokeless tobacco: Never Used  . Alcohol use No    Review of Systems  Constitutional: Increased thirst. No fever/chills.  Eyes: No visual changes. ENT: No sore throat. Cardiovascular: Denies chest pain. Respiratory: Denies shortness of breath. Gastrointestinal: No abdominal pain.  No nausea, no vomiting.  No diarrhea.  No  constipation. Genitourinary: Increased urinary frequency. Negative for dysuria. Musculoskeletal: Negative for back pain. Skin: Negative for rash. Neurological: Negative for headaches, focal weakness or numbness.   ____________________________________________   PHYSICAL EXAM:  VITAL SIGNS: ED Triage Vitals  Enc Vitals Group     BP 11/14/16 2109 (!) 127/99     Pulse Rate 11/14/16 2109 87     Resp 11/14/16 2109 18     Temp 11/14/16 2109 97.8 F (36.6 C)     Temp Source 11/14/16 2109 Oral     SpO2 11/14/16 2109 97 %     Weight 11/14/16 2104 235 lb (106.6 kg)     Height 11/14/16 2104 5\' 7"  (1.702 m)     Head Circumference --      Peak Flow --      Pain Score 11/14/16 2104 10     Pain Loc --      Pain Edu? --      Excl. in GC? --     Constitutional: Alert and oriented. Well appearing and in no acute distress. Eyes: Conjunctivae are normal. PERRL. EOMI. Head: Atraumatic. Nose: No congestion/rhinnorhea. Mouth/Throat: Mucous membranes are moist.  Oropharynx non-erythematous. Neck: No stridor.   Cardiovascular: Normal rate, regular rhythm. Grossly normal heart sounds.  Good peripheral circulation. Respiratory: Normal respiratory effort.  No retractions. Lungs CTAB. Gastrointestinal: Soft and nontender. No distention. No abdominal bruits. No CVA tenderness. Musculoskeletal: No lower extremity tenderness nor edema.  No joint effusions. Neurologic:  Normal speech and language. No gross focal neurologic deficits are appreciated. No gait instability. Skin:  Skin is warm, dry and intact. No rash noted. Psychiatric: Mood and affect are normal. Speech and behavior are normal.  ____________________________________________   LABS (all labs ordered are listed, but only abnormal results are displayed)  Labs Reviewed  BASIC METABOLIC PANEL - Abnormal; Notable for the following:       Result Value   Sodium 128 (*)    Chloride 96 (*)    Glucose, Bld 496 (*)    BUN 22 (*)     Creatinine, Ser 1.97 (*)    GFR calc non Af Amer 39 (*)    GFR calc Af Amer 45 (*)    All other components within normal limits  URINALYSIS, COMPLETE (UACMP) WITH MICROSCOPIC - Abnormal; Notable for the following:    Color, Urine STRAW (*)    APPearance CLEAR (*)    Glucose, UA >=500 (*)    All other components within normal limits  GLUCOSE, CAPILLARY - Abnormal; Notable for the following:    Glucose-Capillary 534 (*)    All other components within normal limits  CBC  URINE DRUG SCREEN, QUALITATIVE ([facility] ONLY)  CK  CBG MONITORING, ED   ____________________________________________  EKG  ED ECG REPORT I, Justun Anaya J, the attending physician, personally viewed and interpreted this ECG.   Date: 11/15/2016  EKG Time: 0324  Rate: 66  Rhythm: normal EKG, normal sinus rhythm  Axis: Normal  Intervals:none  ST&T Change: BER; nonspecific  ____________________________________________  RADIOLOGY  None ____________________________________________   PROCEDURES  Procedure(s) performed: None  Procedures  Critical Care performed: No  ____________________________________________   INITIAL IMPRESSION / ASSESSMENT AND PLAN / ED COURSE  Pertinent labs & imaging results that were available during my care of the patient were reviewed by me and considered in my medical decision making (see chart for details).  46 year old male with a history of insulin-dependent diabetes who presents with hyperglycemia. Laboratory and urinalysis results remarkable for elevated glucose, hyponatremia, acute kidney injury. Patient has a history of substance abuse; denies current use. Will check urine tox screen. Initiate IV fluid resuscitation with 2 L normal saline with 10 units IV insulin; will reassess.  Clinical Course as of Nov 15 233  Wed Nov 15, 2016  0233 UDS, CK are negative. Given patient's acute kidney injury and near doubling of his creatinine over the past 3 months, associated  hyperglycemia, hyponatremia and hypertension, will discuss with hospitalist to evaluate patient in the emergency department for admission.  [JS]    Clinical Course User Index [JS] Irean HongSung, Ersa Delaney J, MD     ____________________________________________   FINAL CLINICAL IMPRESSION(S) / ED DIAGNOSES  Final diagnoses:  Hyperglycemia  AKI (acute kidney injury) (HCC)  Dehydration      NEW MEDICATIONS STARTED DURING THIS VISIT:  New Prescriptions   No medications on file     Note:  This document was prepared using Dragon voice recognition software and may include unintentional dictation errors.    Irean HongSung, Gilles Trimpe J, MD 11/15/16 [phone]

## 2016-11-15 NOTE — ED Notes (Signed)
Patient given apple sauce and diet gingerale 

## 2016-11-15 NOTE — Progress Notes (Signed)
Admitted this morning for hyperglycemia, hyponatremia. Started on IV fluids, repeat sodium improved, to be blood sugar improved. Patient complains of feet  due to peripheral neuropathy. meds, labs reviewed. Vitals stable.  Physical examination: Alert, awake, oriented.  Cardiovascular: S1, S2 regular. Lungs: Clear to auscultation, no wheezes, no rales.  Assessment and plan;  #1 hyperglycemia and hyponatremia: Patient may have pseudohyponatremia: The sugars improved from 500s to 190s. And hyponatremia also improved, acute kidney injury kidney injury improved with IV hydration. Likely discharge tomorrow morning. Has been having issues with high sugars. Diabetes coordinator recommended to continue Lantus 24 units, metformin 1 g by mouth twice a day, NovoLog correction scale, fibular hemoglobin A1c, diabetes teaching. D/w with patient, patient's registered nurse, case Production designer, theatre/television/filmmanager. #2 peripheral neuropathy: Continue on Neurontin. Unable to give narcotics. RN Deidra was in room during my visit with pt. Time spent;25 min

## 2016-11-15 NOTE — Progress Notes (Addendum)
Inpatient Diabetes Program Recommendations  AACE/ADA: New Consensus Statement on Inpatient Glycemic Control (2015)  Target Ranges:  Prepandial:   less than 140 mg/dL      Peak postprandial:   less than 180 mg/dL (1-2 hours)      Critically ill patients:  140 - 180 mg/dL   Results for Dustin Williamson, Dustin Williamson (MRN [id number]) as of 11/15/2016 09:05  Ref. Range 11/14/2016 21:12 11/15/2016 02:59 11/15/2016 07:21  Glucose-Capillary Latest Ref Range: 65 - 99 mg/dL 469534 (HH) 629171 (H) 528196 (H)    Admit with: Hyperglycemia/ Dehydration  History: DM  Home DM Meds: Lantus 80 units QHS       Metformin 1000 mg BID  Current Insulin Orders: Lantus 24 units QHS        Novolog Resistant Correction Scale/ SSI (0-20 units) TID AC + HS      Metformin 1000 mg BID      MD- Note patient given IVF and 10 units Novolog last night.  CBGs vastly improved since admission.  CBG this AM down to 196 mg/dl.  MD- Please consider ordering Current Hemoglobin A1c level to determine glucose control at home for past 3 months.  Plan to speak with patient to assess home diabetes regimen.     --Will follow patient during hospitalization--  Ambrose FinlandJeannine Johnston Corinthian Mizrahi RN, MSN, CDE Diabetes Coordinator Inpatient Glycemic Control Team Team Pager: [phone] (8a-5p)

## 2016-11-16 LAB — GLUCOSE, CAPILLARY: GLUCOSE-CAPILLARY: 195 mg/dL — AB (ref 65–99)

## 2016-11-16 LAB — HEMOGLOBIN A1C
Hgb A1c MFr Bld: 11 % — ABNORMAL HIGH (ref 4.8–5.6)
MEAN PLASMA GLUCOSE: 269 mg/dL

## 2016-11-16 MED ORDER — INSULIN ASPART 100 UNIT/ML ~~LOC~~ SOLN: 0.0000 [IU] | Freq: Every day | SUBCUTANEOUS | 11 refills | Status: DC

## 2016-11-16 MED ORDER — INSULIN ASPART 100 UNIT/ML ~~LOC~~ SOLN: 0.0000 [IU] | Freq: Three times a day (TID) | SUBCUTANEOUS | 11 refills | Status: DC

## 2016-11-16 NOTE — Discharge Summary (Signed)
Dustin Williamson, is a 46 y.o. male  DOB [birth year]  MRN [id number].  Admission date:  11/15/2016  Admitting Physician  Ihor AustinPavan Pyreddy, MD  Discharge Date:  11/16/2016   Primary MD  Emogene MorganAycock, Ngwe A, MD  Recommendations for primary care physician for things to follow:   Follow-up with primary doctor in 1 week   Admission Diagnosis  Dehydration [E86.0] Hyperglycemia [R73.9] AKI (acute kidney injury) (HCC) [N17.9]   Discharge Diagnosis  Dehydration [E86.0] Hyperglycemia [R73.9] AKI (acute kidney injury) (HCC) [N17.9]    Active Problems:   Hyponatremia      Past Medical History:  Diagnosis Date  . Anxiety   . Depressed   . Diabetes mellitus without complication (HCC)   . Heartburn   . HLD (hyperlipidemia)   . Hypertension   . Kidney stone   . Migraines   . Sleep apnea     Past Surgical History:  Procedure Laterality Date  . CYSTOSCOPY W/ URETERAL STENT REMOVAL N/A 05/27/2016   Procedure: CYSTOSCOPY WITH STENT REMOVAL;  Surgeon: Marin OlpJay H Kim, MD;  Location: [facility] ORS;  Service: Urology;  Laterality: N/A;  . CYSTOSCOPY WITH STENT PLACEMENT Left 05/05/2016   Procedure: CYSTOSCOPY WITH STENT PLACEMENT;  Surgeon: Vanna ScotlandAshley Brandon, MD;  Location: [facility] ORS;  Service: Urology;  Laterality: Left;  . HERNIA REPAIR    . KNEE SURGERY Left 2009       History of present illness and  Hospital Course:     Kindly see H&P for history of present illness and admission details, please review complete Labs, Consult reports and Test reports for all details in brief  HPI  from the history and physical done on the day of admission  46-year-old male patient with diabetes mellitus type 2, hyperlipidemia, hypertension, diabetic neuropathy admitted because of elevated blood sugar with his vision glucometer reading high. Patient has polyuria  polydipsia. No other complaints. Patient appeared to admission.   Hospital Course  #1. Hyperglycemia with uncontrolled diabetes mellitus type 2: No evidence of DKA on admission. Patient admitted to hospitalist service, started on IV hydration, seen by diabetes nurse. Blood sugars initially more than 500 dropped to 195. Patient for diabetes nurse that he sugars have been running the highly at home lately. And the diabetes nurse explained about use of Lantus and she recommended to start the patient on NovoLog correction scale with meals. So patient is discharged home with metformin 1 g twice a day, Lantus 80 units daily at bedtime, NovoLog correction  scale with meals and also at bedtime.  2. Hyponatremia; hyponatremia because of fall hyperglycemia with blood sugar more than 670 and Chem-7 by serum chemistry. Received IV hydration, sodium improved to 136. Patient's hemoglobin A1c is 11. #3 acute kidney injury: Improved with IV hydration. #4. Anxiety, depression: Patient on Paxil. #5 essential hypertension: Blood pressure is stable today, patient can resume his propranolol. Lisinopril  Discharge Condition:stable   Follow UP  Follow-up Information    Emogene MorganAycock, Ngwe A, MD. Go on 11/23/2016.   Specialty:  Family Medicine Why:  at 10:20 a.m. Contact information: [address] [place] [address] [phone]             Discharge Instructions  and  Discharge Medications     Allergies as of 11/16/2016   No Known Allergies     Medication List    TAKE these medications   atorvastatin 20 MG tablet Commonly known as:  LIPITOR Take 1 tablet (20 mg total) by  mouth daily.   hydrOXYzine 25 MG tablet Commonly known as:  ATARAX/VISTARIL Take 1 tablet (25 mg total) by mouth 3 (three) times daily.   insulin aspart 100 UNIT/ML injection Commonly known as:  novoLOG Inject 0-20 Units into the skin 3 (three) times daily with meals.   insulin aspart 100 UNIT/ML  injection Commonly known as:  novoLOG Inject 0-5 Units into the skin at bedtime.   insulin glargine 100 UNIT/ML injection Commonly known as:  LANTUS Inject 0.24 mLs (24 Units total) into the skin at bedtime. What changed:  how much to take   lisinopril 10 MG tablet Commonly known as:  PRINIVIL,ZESTRIL Take 1 tablet (10 mg total) by mouth daily.   metFORMIN 1000 MG tablet Commonly known as:  GLUCOPHAGE Take 1 tablet (1,000 mg total) by mouth 2 (two) times daily with a meal.   PARoxetine 30 MG tablet Commonly known as:  PAXIL Take 1 tablet (30 mg total) by mouth daily.   propranolol 80 MG tablet Commonly known as:  INDERAL Take 1 tablet (80 mg total) by mouth 2 (two) times daily.         Diet and Activity recommendation: See Discharge Instructions above   Consults obtained -Diabetes coronary   Major procedures and Radiology Reports - PLEASE review detailed and final reports for all details, in brief -      No results found.  Micro Results     No results found for this or any previous visit (from the past 240 hour(s)).     Today   Subjective:   Dustin Williamson today has no headache,no chest abdominal pain,no new weakness tingling or numbness, feels much better wants to go home today.   Objective:   Blood pressure 115/75, pulse 69, temperature 98.8 F (37.1 C), resp. rate 20, height 5\' 7"  (1.702 m), weight 112.7 kg (248 lb 6.4 oz), SpO2 97 %.   Intake/Output Summary (Last 24 hours) at 11/16/16 0909 Last data filed at 11/15/16 1800  Gross per 24 hour  Intake          1850.83 ml  Output              750 ml  Net          1100.83 ml    Exam Awake Alert, Oriented x 3, No new F.N deficits, Normal affect [place].AT,PERRAL Supple Neck,No JVD, No cervical lymphadenopathy appriciated.  Symmetrical Chest wall movement, Good air movement bilaterally, CTAB RRR,No Gallops,Rubs or new Murmurs, No Parasternal Heave +ve B.Sounds, Abd Soft, Non tender, No organomegaly  appriciated, No rebound -guarding or rigidity. No Cyanosis, Clubbing or edema, No new Rash or bruise  Data Review   CBC w Diff: Lab Results  Component Value Date   WBC 8.7 11/15/2016   HGB 14.7 11/15/2016   HGB 16.4 10/16/2014   HCT 41.4 11/15/2016   HCT 48.7 10/16/2014   PLT 216 11/15/2016   PLT 219 10/16/2014   LYMPHOPCT 25 05/21/2016   LYMPHOPCT 43.8 10/16/2014   MONOPCT 9 05/21/2016   MONOPCT 7.9 10/16/2014   EOSPCT 2 05/21/2016   EOSPCT 1.4 10/16/2014   BASOPCT 1 05/21/2016   BASOPCT 0.7 10/16/2014    CMP: Lab Results  Component Value Date   NA 136 11/15/2016   NA 133 (L) 10/16/2014   K 3.5 11/15/2016   K 3.7 10/16/2014   CL 100 (L) 11/15/2016   CL 104 10/16/2014   CO2 29 11/15/2016   CO2 22 10/16/2014   BUN 20  11/15/2016   BUN 17 10/16/2014   CREATININE 1.39 (H) 11/15/2016   CREATININE 1.63 (H) 10/16/2014   PROT 5.3 (L) 05/22/2016   PROT 7.9 10/16/2014   ALBUMIN 3.1 (L) 05/22/2016   ALBUMIN 4.7 10/16/2014   BILITOT 0.3 05/22/2016   BILITOT 0.4 10/16/2014   ALKPHOS 61 05/22/2016   ALKPHOS 85 10/16/2014   AST 17 05/22/2016   AST 23 10/16/2014   ALT 14 (L) 05/22/2016   ALT 23 10/16/2014  .   Total Time in preparing paper work, data evaluation and todays exam - 35 minutes  Elleni Mozingo M.D on 11/16/2016 at 9:09 AM    Note: This dictation was prepared with Dragon dictation along with smaller phrase technology. Any transcriptional errors that result from this process are unintentional.

## 2016-11-16 NOTE — Discharge Planning (Signed)
IV removed.  Patient discharge papers given, explained and educated.  Informed of scripts e-scribed to pharm.  FU appts suggested and appts made.  RN assessment and VS revealed stability for DC to home.  Once ready, will be walked to front and family transporting home via car.

## 2016-11-29 IMAGING — US US RENAL
1 series · 14 of 25 positions shown · non-contrast
Comparison: 05/04/2016, CT 04/16/2016

CLINICAL DATA: Left flank pain for 3 weeks with recent stent
placement, gross hematuria

EXAM:
RENAL / URINARY TRACT ULTRASOUND COMPLETE

[Series 1: us renal · 0.25mm/px · 14 of 40 slices shown]
[im 1/40]
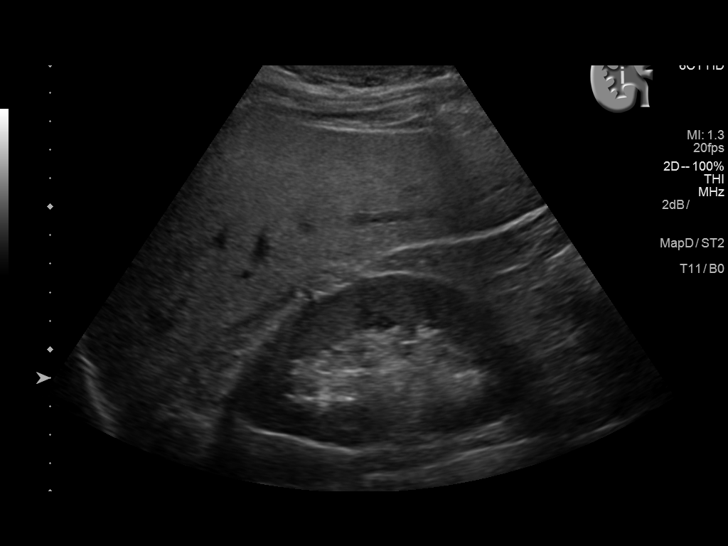
[im 4/40]
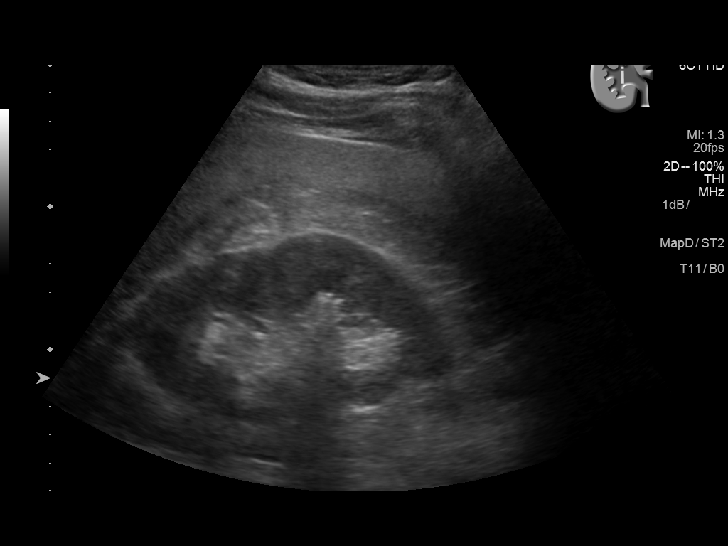
[im 7/40]
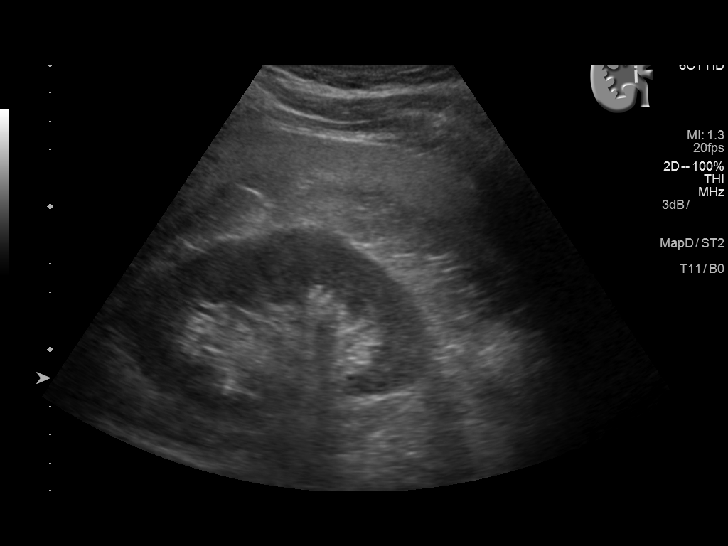
[im 10/40]
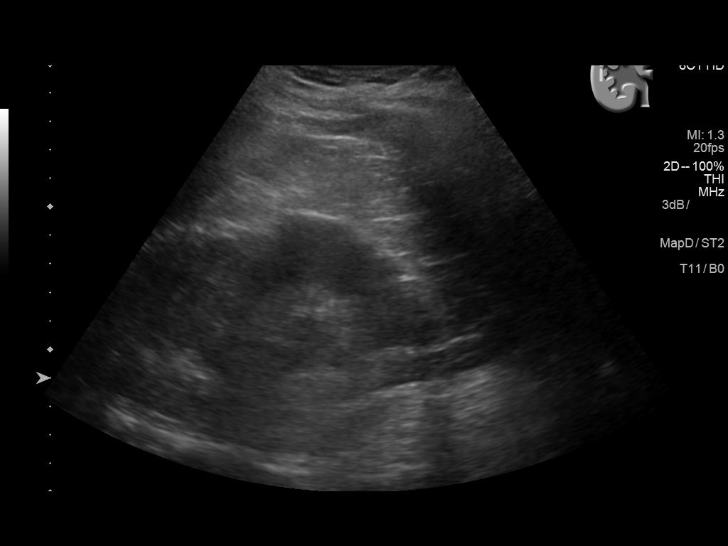
[im 14/40]
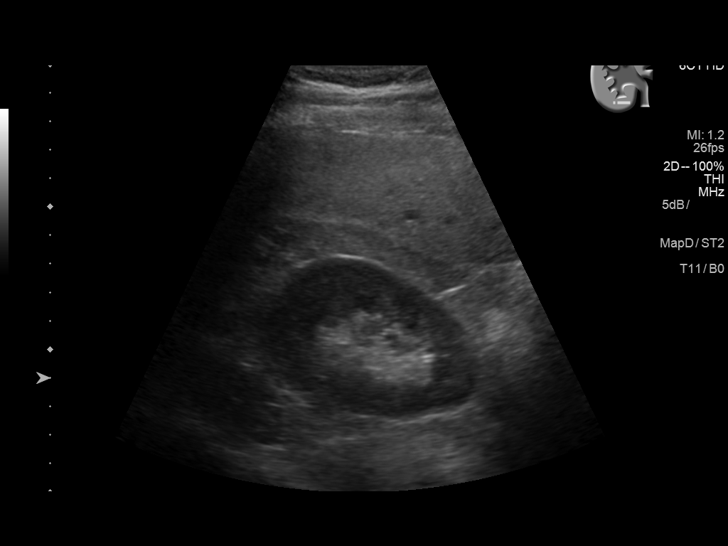
[im 15/40]
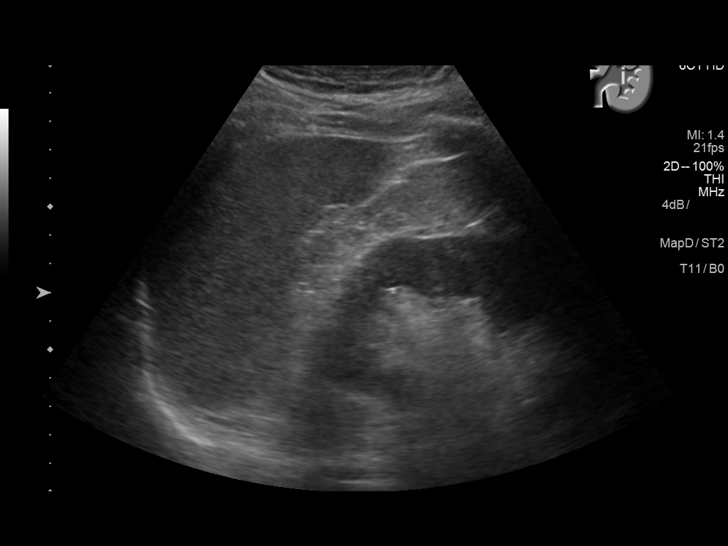
[im 18/40]
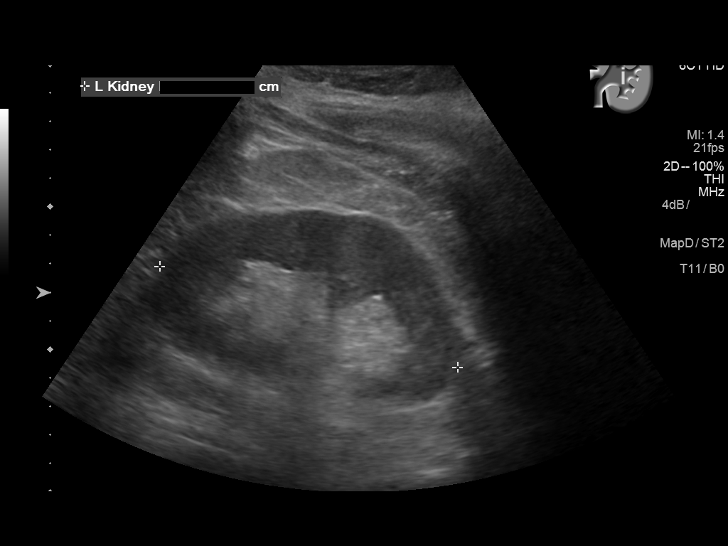
[im 22/40]
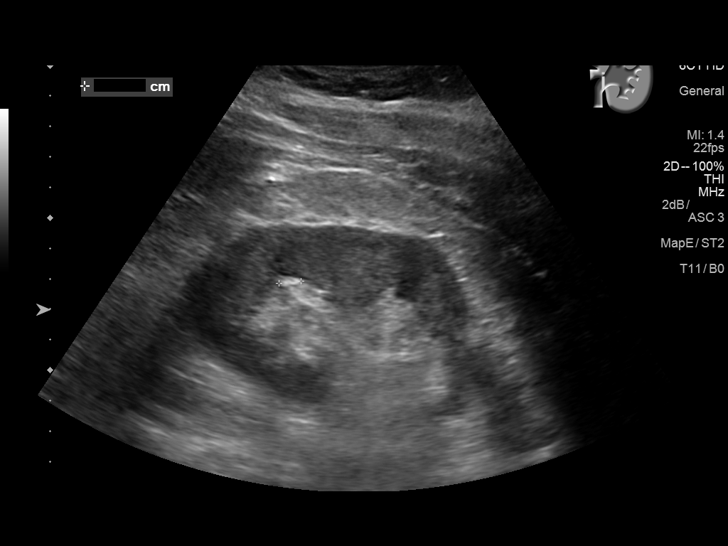
[im 25/40]
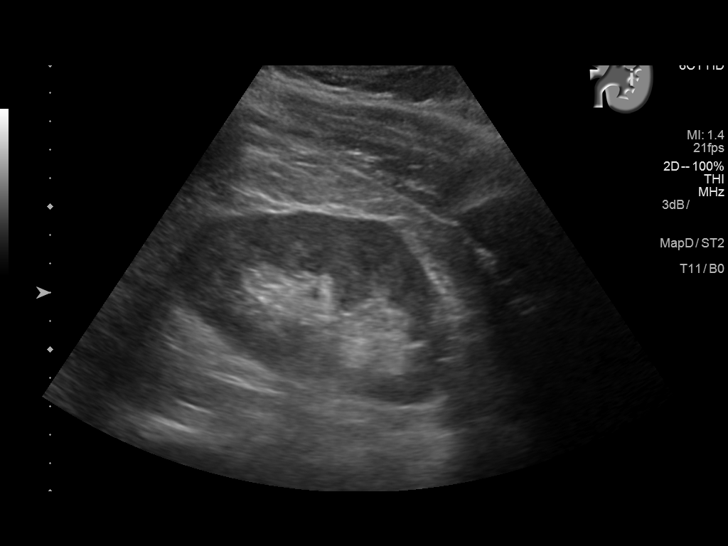
[im 27/40]
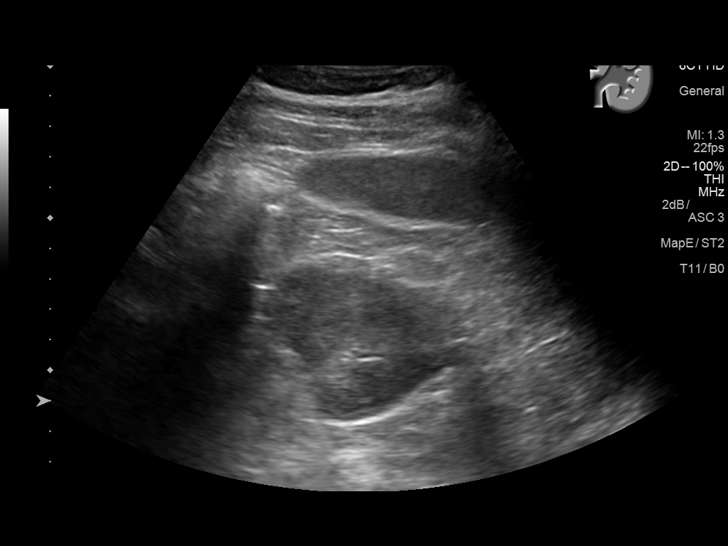
[im 30/40]
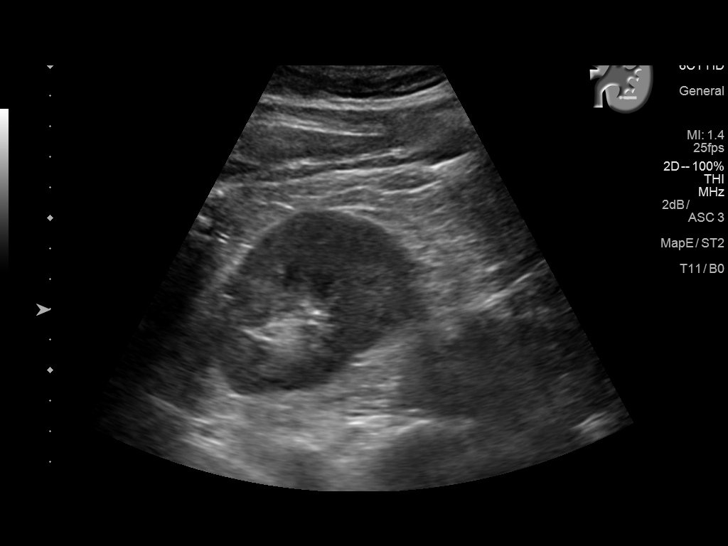
[im 33/40]
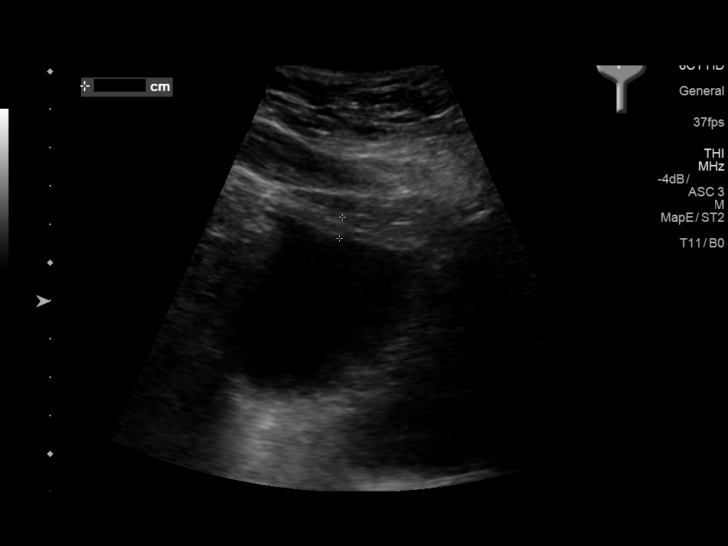
[im 36/40]
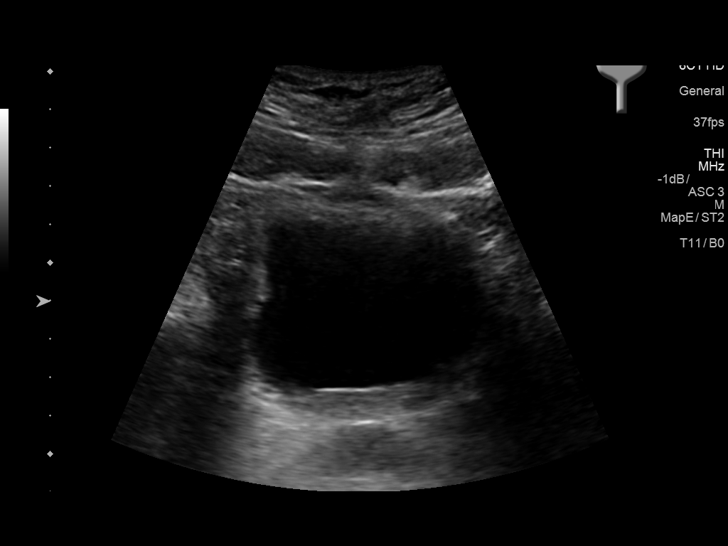
[im 40/40]
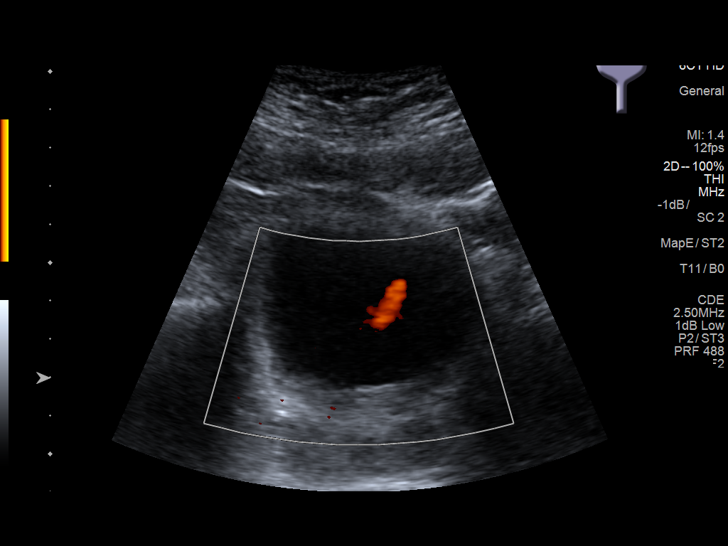

[14 of 25 positions shown; findings below may reference images not displayed]

FINDINGS: Right Kidney:

Length: 11.5 cm. Echogenicity within normal limits. No mass or
hydronephrosis visualized. Possible 7 mm stone lower pole right
kidney, although no stone of such size was seen on recent CT.

Left Kidney:

Length: 11.0 cm. Echogenicity within normal limits. No mass or
hydronephrosis visualized. Possible stone in the upper pole of the
left kidney measuring 7 mm, although no corresponding stone seen on
recent CT. Intrarenal portion of left stent not well visualized.

Bladder:

Mild wall thickening suggested at 6 mm. Left ureteral stent is
visualized. No left ureteral jet visualized.
IMPRESSION: 1. No significant hydronephrosis is visualized. Intrarenal portion
of the stent not well identified. Distal portion of the stent is
within the left aspect of the bladder.
2. Echodense foci within the bilateral kidneys, patient had previous
CT demonstrated punctate stones within both kidneys.

## 2017-02-13 ENCOUNTER — Emergency Department
Admission: EM | Admit: 2017-02-13 | Discharge: 2017-02-14 | Disposition: A | Discharge status: Home / Self Care | Payer: Self-pay | Attending: Emergency Medicine | Admitting: Emergency Medicine

## 2017-02-13 ENCOUNTER — Encounter: Payer: Self-pay | Admitting: Emergency Medicine

## 2017-02-13 ENCOUNTER — Emergency Department: Payer: Self-pay

## 2017-02-13 ENCOUNTER — Other Ambulatory Visit: Payer: Self-pay

## 2017-02-13 DIAGNOSIS — I10 Essential (primary) hypertension: Secondary | ICD-10-CM | POA: Insufficient documentation

## 2017-02-13 DIAGNOSIS — Z87891 Personal history of nicotine dependence: Secondary | ICD-10-CM | POA: Insufficient documentation

## 2017-02-13 DIAGNOSIS — E119 Type 2 diabetes mellitus without complications: Secondary | ICD-10-CM | POA: Insufficient documentation

## 2017-02-13 DIAGNOSIS — Z79899 Other long term (current) drug therapy: Secondary | ICD-10-CM | POA: Insufficient documentation

## 2017-02-13 DIAGNOSIS — Z794 Long term (current) use of insulin: Secondary | ICD-10-CM | POA: Insufficient documentation

## 2017-02-13 DIAGNOSIS — R519 Headache, unspecified: Secondary | ICD-10-CM

## 2017-02-13 DIAGNOSIS — R51 Headache: Secondary | ICD-10-CM | POA: Insufficient documentation

## 2017-02-13 LAB — CBC
HCT: 46 % (ref 40.0–52.0)
Hemoglobin: 16.1 g/dL (ref 13.0–18.0)
MCH: 30.6 pg (ref 26.0–34.0)
MCHC: 35 g/dL (ref 32.0–36.0)
MCV: 87.6 fL (ref 80.0–100.0)
Platelets: 200 10*3/uL (ref 150–440)
RBC: 5.25 MIL/uL (ref 4.40–5.90)
RDW: 12.9 % (ref 11.5–14.5)
WBC: 7.8 10*3/uL (ref 3.8–10.6)

## 2017-02-13 LAB — CBC WITH DIFFERENTIAL/PLATELET
BASOS ABS: 0.1 10*3/uL (ref 0–0.1)
Basophils Relative: 1 %
Eosinophils Absolute: 0.2 10*3/uL (ref 0–0.7)
Eosinophils Relative: 3 %
HEMATOCRIT: 44.7 % (ref 40.0–52.0)
HEMOGLOBIN: 15.6 g/dL (ref 13.0–18.0)
LYMPHS PCT: 47 %
Lymphs Abs: 3.9 10*3/uL — ABNORMAL HIGH (ref 1.0–3.6)
MCH: 30.6 pg (ref 26.0–34.0)
MCHC: 34.8 g/dL (ref 32.0–36.0)
MCV: 88.1 fL (ref 80.0–100.0)
MONO ABS: 0.5 10*3/uL (ref 0.2–1.0)
Monocytes Relative: 7 %
NEUTROS ABS: 3.4 10*3/uL (ref 1.4–6.5)
Neutrophils Relative %: 42 %
Platelets: 182 10*3/uL (ref 150–440)
RBC: 5.08 MIL/uL (ref 4.40–5.90)
RDW: 13 % (ref 11.5–14.5)
WBC: 8.1 10*3/uL (ref 3.8–10.6)

## 2017-02-13 LAB — DIFFERENTIAL
BASOS ABS: 0.1 10*3/uL (ref 0–0.1)
BASOS PCT: 1 %
EOS ABS: 0.2 10*3/uL (ref 0–0.7)
Eosinophils Relative: 3 %
Lymphocytes Relative: 42 %
Lymphs Abs: 3.3 10*3/uL (ref 1.0–3.6)
Monocytes Absolute: 0.5 10*3/uL (ref 0.2–1.0)
Monocytes Relative: 6 %
NEUTROS PCT: 48 %
Neutro Abs: 3.8 10*3/uL (ref 1.4–6.5)

## 2017-02-13 LAB — COMPREHENSIVE METABOLIC PANEL
ALBUMIN: 4.5 g/dL (ref 3.5–5.0)
ALT: 55 U/L (ref 17–63)
AST: 36 U/L (ref 15–41)
Alkaline Phosphatase: 75 U/L (ref 38–126)
Anion gap: 8 (ref 5–15)
BUN: 20 mg/dL (ref 6–20)
CHLORIDE: 98 mmol/L — AB (ref 101–111)
CO2: 25 mmol/L (ref 22–32)
Calcium: 9.1 mg/dL (ref 8.9–10.3)
Creatinine, Ser: 1.43 mg/dL — ABNORMAL HIGH (ref 0.61–1.24)
GFR calc Af Amer: >60 mL/min (ref >60)
GFR, EST NON AFRICAN AMERICAN: 57 mL/min — AB (ref >60)
GLUCOSE: 315 mg/dL — AB (ref 65–99)
POTASSIUM: 5.1 mmol/L (ref 3.5–5.1)
Sodium: 131 mmol/L — ABNORMAL LOW (ref 135–145)
Total Bilirubin: 0.8 mg/dL (ref 0.3–1.2)
Total Protein: 7.9 g/dL (ref 6.5–8.1)

## 2017-02-13 LAB — PROTIME-INR
INR: 0.94
Prothrombin Time: 12.5 seconds (ref 11.4–15.2)

## 2017-02-13 LAB — APTT: APTT: 25 s (ref 24–36)

## 2017-02-13 LAB — TROPONIN I

## 2017-02-13 MED ORDER — METOCLOPRAMIDE HCL 5 MG/ML IJ SOLN
10.0000 mg | Freq: Once | INTRAMUSCULAR | Status: AC
  Administered 2017-02-13: 10 mg via INTRAVENOUS
  Filled 2017-02-13: qty 2

## 2017-02-13 MED ORDER — DIPHENHYDRAMINE HCL 50 MG/ML IJ SOLN
25.0000 mg | Freq: Once | INTRAMUSCULAR | Status: AC
  Administered 2017-02-13: 25 mg via INTRAVENOUS
  Filled 2017-02-13: qty 1

## 2017-02-13 MED ORDER — SODIUM CHLORIDE 0.9 % IV BOLUS (SEPSIS)
1000.0000 mL | Freq: Once | INTRAVENOUS | Status: AC
  Administered 2017-02-13: 1000 mL via INTRAVENOUS

## 2017-02-13 MED ORDER — IOPAMIDOL (ISOVUE-370) INJECTION 76%
75.0000 mL | Freq: Once | INTRAVENOUS | Status: AC | PRN
  Administered 2017-02-13: 75 mL via INTRAVENOUS

## 2017-02-13 MED ORDER — KETOROLAC TROMETHAMINE 30 MG/ML IJ SOLN
30.0000 mg | Freq: Once | INTRAMUSCULAR | Status: AC
  Administered 2017-02-13: 30 mg via INTRAVENOUS
  Filled 2017-02-13: qty 1

## 2017-02-13 NOTE — ED Notes (Signed)
Patient transported to CT 

## 2017-02-13 NOTE — ED Triage Notes (Signed)
Arrives with c/o left sided headache x 2 days.  Sent to ED from PCP for "urgent CVA evaluation"

## 2017-02-13 NOTE — ED Provider Notes (Signed)
[facility] Emergency Department Provider Note ____________________________________________   First MD Initiated Contact with Patient 02/13/17 2209     (approximate)  I have reviewed the triage vital signs and the nursing notes.   HISTORY  Chief Complaint Headache    HPI Dustin Williamson is a 46 y.o. male With history of GERD, hypertension, migraines, diabetes, who presents with headache for 2 days, acute onset yesterday at 90 AM, left-sided, originating in the left temporal area and going down to the left side of the neck, and associated with nausea and vomiting but not with photophobia. Patient states he has migraines but they usually go midline from the neck to the top of his head, and he usually has photophobia with them. Patient also states that this time he feels dizzy which she normally does not with his migraines. Patient denies any numbness, tingling, or weakness in his extremities. No speech difficulty. She was seen in his primary care office today and referred for evaluation in the ER. He also reports that while he vomited at the doctor's office today he saw bright red blood in it. Per note from the PMD there was mucus with streaks of blood. She has no prior history of GI bleed. Patient states his bowel movements have been normal and denies any dark, bloody, or tarry stools. Patient also mentions that recently he developed a "hole" in his abdomen at the site of the previous hernia that he can feel through his skin.   Past Medical History:  Diagnosis Date  . Anxiety   . Depressed   . Diabetes mellitus without complication ([place])   . Heartburn   . HLD (hyperlipidemia)   . Hypertension   . Kidney stone   . Migraines   . Sleep apnea     Patient Active Problem List   Diagnosis Date Noted  . Hyponatremia 11/15/2016  . OSA (obstructive sleep apnea) 08/12/2016  . HTN (hypertension) 08/12/2016  . Depression 08/12/2016  . Left ureteral stone   .  Ureteral obstruction, left 05/22/2016  . Acute left flank pain   . AKI (acute kidney injury) ([place]) 05/04/2016  . Diabetes ([place]) 05/04/2016  . Hypotension 05/04/2016  . Urinary obstruction 05/04/2016  . Hyperglycemia 09/26/2015  . CLOSED FRACTURE OF BASE OF OTHER METACARPAL BONE 03/09/2009    Past Surgical History:  Procedure Laterality Date  . CYSTOSCOPY W/ URETERAL STENT REMOVAL N/A 05/27/2016   Procedure: CYSTOSCOPY WITH STENT REMOVAL;  Surgeon: Darcella Cheshire, MD;  Location: [facility] ORS;  Service: Urology;  Laterality: N/A;  . CYSTOSCOPY WITH STENT PLACEMENT Left 05/05/2016   Procedure: CYSTOSCOPY WITH STENT PLACEMENT;  Surgeon: Hollice Espy, MD;  Location: [facility] ORS;  Service: Urology;  Laterality: Left;  . HERNIA REPAIR    . KNEE SURGERY Left 2009    Prior to Admission medications   Medication Sig Start Date End Date Taking? Authorizing Provider  atorvastatin (LIPITOR) 20 MG tablet Take 1 tablet (20 mg total) by mouth daily. 08/13/16   Dustin Flock, MD  hydrOXYzine (ATARAX/VISTARIL) 25 MG tablet Take 1 tablet (25 mg total) by mouth 3 (three) times daily. 08/13/16   Dustin Flock, MD  insulin aspart (NOVOLOG) 100 UNIT/ML injection Inject 0-20 Units into the skin 3 (three) times daily with meals. 11/16/16   Epifanio Lesches, MD  insulin aspart (NOVOLOG) 100 UNIT/ML injection Inject 0-5 Units into the skin at bedtime. 11/16/16   Epifanio Lesches, MD  insulin glargine (LANTUS) 100 UNIT/ML injection Inject 0.24 mLs (24  Units total) into the skin at bedtime. Patient taking differently: Inject 80 Units into the skin at bedtime.  05/06/16   Loletha Grayer, MD  lisinopril (PRINIVIL,ZESTRIL) 10 MG tablet Take 1 tablet (10 mg total) by mouth daily. 08/13/16   Dustin Flock, MD  metFORMIN (GLUCOPHAGE) 1000 MG tablet Take 1 tablet (1,000 mg total) by mouth 2 (two) times daily with a meal. 08/13/16   Dustin Flock, MD  PARoxetine (PAXIL) 30 MG tablet Take 1 tablet (30 mg total) by mouth  daily. 08/13/16   Dustin Flock, MD  propranolol (INDERAL) 80 MG tablet Take 1 tablet (80 mg total) by mouth 2 (two) times daily. 08/13/16   Dustin Flock, MD    Allergies Patient has no known allergies.  Family History  Problem Relation Age of Onset  . Diabetes Mellitus II Father   . Cancer Father   . Multiple myeloma Mother   . Prostate cancer Neg Hx   . Kidney disease Neg Hx   . Bladder Cancer Neg Hx     Social History Social History  Substance Use Topics  . Smoking status: Former Smoker    Quit date: 06/19/2000  . Smokeless tobacco: Never Used  . Alcohol use No    Review of Systems  Constitutional: No fever/chills Eyes: No visual changes. ENT: No sore throat. Cardiovascular: Denies chest pain. Respiratory: Denies shortness of breath. Gastrointestinal: Positive for nausea and vomiting.  No diarrhea.  Genitourinary: Negative for dysuria.  Musculoskeletal: Negative for back pain.positive for left neck pain. Skin: Negative for rash. Neurological: positive for headaches,negative for focal weakness or numbness.   ____________________________________________   PHYSICAL EXAM:  VITAL SIGNS: ED Triage Vitals  Enc Vitals Group     BP 02/13/17 1734 (!) 146/106     Pulse Rate 02/13/17 1734 86     Resp 02/13/17 1734 18     Temp 02/13/17 1734 98.4 F (36.9 C)     Temp Source 02/13/17 1734 Oral     SpO2 02/13/17 1734 99 %     Weight 02/13/17 1734 267 lb (121.1 kg)     Height 02/13/17 1734 5' 7"  (1.702 m)     Head Circumference --      Peak Flow --      Pain Score 02/13/17 1739 10     Pain Loc --      Pain Edu? --      Excl. in [place]? --     Constitutional: Alert and oriented. Anxious appearing, in no acute distress. Eyes: Conjunctivae are normal. No pallor.EOMI. PERRLA. Head: Atraumatic. Nose: No congestion/rhinnorhea. Mouth/Throat: Mucous membranes are moist.   Neck: Normal range of motion. No midline tenderness. Pain on palpation of the left side of the  neck. Cardiovascular: Normal rate, regular rhythm. Grossly normal heart sounds.  Good peripheral circulation. Respiratory: Normal respiratory effort.  No retractions. Lungs CTAB. Gastrointestinal: Soft and nontender. No distention. At the site in the lower mid abdomen where patient states there is a "hole," I do not palpate any significant fascial defect, there is no skin lesion, and no tenderness. Genitourinary: No CVA tenderness. Musculoskeletal: No lower extremity edema.  Extremities warm and well perfused.  Neurologic:  Normal speech and language. Motor and sensation intact in all extremities. 5 out of 5 motor strength in all extremities. Normal coordination, and finger to nose. Cranial nerves III through XII intact except as follows: atient reports subjective decreased sensation to left side of face as compared to right. Difficulty raising left eyebrow due  to pain however when closing his eyes, smiling, or other facial motor tasks he is symmetric, and there is no facial droop. Skin:  Skin is warm and dry. No rash noted. Psychiatric: Mood and affect are normal. Speech and behavior are normal.  ____________________________________________   LABS (all labs ordered are listed, but only abnormal results are displayed)  Labs Reviewed  COMPREHENSIVE METABOLIC PANEL - Abnormal; Notable for the following:       Result Value   Sodium 131 (*)    Chloride 98 (*)    Glucose, Bld 315 (*)    Creatinine, Ser 1.43 (*)    GFR calc non Af Amer 57 (*)    All other components within normal limits  CBC WITH DIFFERENTIAL/PLATELET - Abnormal; Notable for the following:    Lymphs Abs 3.9 (*)    All other components within normal limits  PROTIME-INR  APTT  CBC  DIFFERENTIAL  TROPONIN I   ____________________________________________  EKG  ED ECG REPORT I, Arta Silence, the attending physician, personally viewed and interpreted this ECG.  Date: 02/13/2017 EKG Time: 1752 Rate: 85 Rhythm:  normal sinus rhythm QRS Axis: normal Intervals: normal ST/T Wave abnormalities: normal Narrative Interpretation: no evidence of acute ischemia  ____________________________________________  RADIOLOGY  CT head without contrast negative for acute findings.  ____________________________________________   PROCEDURES  Procedure(s) performed: No    Critical Care performed: No ____________________________________________   INITIAL IMPRESSION / ASSESSMENT AND PLAN / ED COURSE  Pertinent labs & imaging results that were available during my care of the patient were reviewed by me and considered in my medical decision making (see chart for details).  66 on male past history as noted presents with 2 days of headache acute onset yesterday, which is unilateral, and associated with nausea and vomiting. It is different than patient's typical migraines. He also reports some when he was vomiting earlier there were streaks of blood which was confirmed by note from his PMD. No gross bleeding, or coffee grounds. No lower GI symptoms. On exam, patient is anxious relatively well appearing, vital signs are normal except for slight hypertension and physical exam is notable for questionable facial motor finding isolated to raising his eyebrows and seemingly more associated with pain than weakness, as well as ? subjective dec sensation to L face not following any specific distribution.  No other findings on thorough neuro exam.  Abdominal exam is soft and nontender with no palpable fascial defect. Headache: Overall most consistent with migraine that is different from patient's typical pattern. There is no evidence for subarachnoid hemorrhage especially given the negative CT and no evidence of meningitis. Given the predominance of headache and the questionable neurologic findings there is no evidence of acute CVA. Plan for CT angiogram of head and neck to rule out any aneurysm or dissection as a possible although  unlikely alternative cause.  Will treat symptomatically with Reglan and NSAIDs. No indication for LP.  Vomiting: Per note from primary care patient vomited mucus with small streaks of blood, he has no history of ulcer or other cause of upper GI bleed or risk factors for this, and I have extremely low suspicion for any active GI bleed. Plan for repeat 4 hour CBC. If headache improved, negative CT angio, and no significant drop in hemoglobin, possible d/c home.   ----------------------------------------- 11:55 PM on 02/13/2017 -----------------------------------------  Hemoglobin unchanged on repeat. CTA head and neck negative. Patient states the headache has significantly improved after the Reglan and he feels well  to go home. Return precautions given and patient states he will talk to his PMD on the phone tomorrow morning.  ____________________________________________   FINAL CLINICAL IMPRESSION(S) / ED DIAGNOSES  Final diagnoses:  Acute nonintractable headache, unspecified headache type      NEW MEDICATIONS STARTED DURING THIS VISIT:  New Prescriptions   No medications on file     Note:  This document was prepared using Dragon voice recognition software and may include unintentional dictation errors.    Arta Silence, MD 02/14/17 [phone]

## 2017-02-13 NOTE — Discharge Instructions (Signed)
Return to the ER for any new, worsening, or persistent severe headache, weakness or numbness, vision changes, confusion, fever, or any other new or worsening symptoms that concern you.  You should also return for any recurrent vomiting with blood or blood in the stool.  Call your doctor tomorrow as discussed.

## 2017-03-26 ENCOUNTER — Ambulatory Visit: Payer: Self-pay | Admitting: Pharmacy Technician

## 2017-03-26 ENCOUNTER — Encounter (INDEPENDENT_AMBULATORY_CARE_PROVIDER_SITE_OTHER): Payer: Self-pay

## 2017-03-26 DIAGNOSIS — Z79899 Other long term (current) drug therapy: Secondary | ICD-10-CM

## 2017-03-26 NOTE — Progress Notes (Signed)
Met with patient completed financial assistance application for [place] due to recent ED visit.  Patient agreed to be responsible for gathering financial information and forwarding to appropriate department in [facility].    Completed [facility] application and contract.  Patient agreed to all terms of the [facility] contract.   Patient approved to receive medication assistance at [facility] through 2018, as long as eligibility criteria continues to be met.  Provided patient with Civil engineer, contracting based on his particular needs.    Proventil and BasaglarPrescription Applications completed with patient.  Patient has appt with Dr. Clide Deutscher on 03/28/17.  Patient taking Proventil and Basaglar Prescriptions Applications to Dr. Clide Deutscher to sign during appt.  Patient agreed to return PAP applications to [facility] once signed by doctor.  Upon receipt of signed applications from provider, Proventil Prescription Application will be submitted to [facility] and Basaglar Prescription Application will be submitted to [place].  [facility]

## 2017-03-29 ENCOUNTER — Telehealth: Payer: Self-pay | Admitting: Pharmacist

## 2017-03-29 NOTE — Telephone Encounter (Signed)
03/29/2017 Mailing [employer] application for Pil[employer]'s [employer] HFA 0.09mg  Inhale 2 puffs into the lungs every 4 to 6 hours as needed for wheezing. # 3. [place] Radon

## 2017-03-29 NOTE — Telephone Encounter (Signed)
03/29/17 Please disregard previous note on Proventil HFA- I have received a printout that Ventolin HFA is replacing Proventil HFA. Therefore not mailing application to [employer] on Proventil HFA. Printed [employer] application for Ventolin HFA Inhale 2 puffs every 4-6 hours as needed for wheezing, replaces Proventil HFA. Mailing patient his portion of [employer] application to sign & return, also mailing script to provider to sign & return.Forde Radon

## 2017-04-16 ENCOUNTER — Telehealth: Payer: Self-pay | Admitting: Pharmacist

## 2017-04-16 NOTE — Telephone Encounter (Signed)
04/16/17 Faxed [employer] application for patient assistance for Ventolin HFA 90mcg Inhale 2 puffs every 4-6 hours as needed for wheezing. Replaces Proventil HFA.AJ 04/16/17 Faxed [employer] application for patient assistance for Lantus Solostar Inject 55 units under the skin 2 times a day, increase to 60 units 2 times a day after 1 week if fasting BS>150. Max daily dose 120units. This medication replaces The PepsiBasaglar Kwikpen per note from provider.AJ 04/16/17 Faxed Novo Nordisk application for patient assistance for QUALCOMMovolog Vial Inject 6 units under the skin daily with meals # 2, Max daily dose 18 units.Forde RadonAJ

## 2017-05-05 ENCOUNTER — Emergency Department: Payer: Self-pay

## 2017-05-05 ENCOUNTER — Emergency Department
Admission: EM | Admit: 2017-05-05 | Discharge: 2017-05-06 | Disposition: A | Discharge status: Home / Self Care | Payer: Self-pay | Attending: Emergency Medicine | Admitting: Emergency Medicine

## 2017-05-05 DIAGNOSIS — Z794 Long term (current) use of insulin: Secondary | ICD-10-CM | POA: Insufficient documentation

## 2017-05-05 DIAGNOSIS — R1084 Generalized abdominal pain: Secondary | ICD-10-CM | POA: Insufficient documentation

## 2017-05-05 DIAGNOSIS — I10 Essential (primary) hypertension: Secondary | ICD-10-CM | POA: Insufficient documentation

## 2017-05-05 DIAGNOSIS — R109 Unspecified abdominal pain: Secondary | ICD-10-CM

## 2017-05-05 DIAGNOSIS — E785 Hyperlipidemia, unspecified: Secondary | ICD-10-CM | POA: Insufficient documentation

## 2017-05-05 DIAGNOSIS — Z79899 Other long term (current) drug therapy: Secondary | ICD-10-CM | POA: Insufficient documentation

## 2017-05-05 DIAGNOSIS — Z87891 Personal history of nicotine dependence: Secondary | ICD-10-CM | POA: Insufficient documentation

## 2017-05-05 DIAGNOSIS — E119 Type 2 diabetes mellitus without complications: Secondary | ICD-10-CM | POA: Insufficient documentation

## 2017-05-05 LAB — BASIC METABOLIC PANEL
Anion gap: 12 (ref 5–15)
BUN: 16 mg/dL (ref 6–20)
CHLORIDE: 102 mmol/L (ref 101–111)
CO2: 20 mmol/L — AB (ref 22–32)
CREATININE: 1.26 mg/dL — AB (ref 0.61–1.24)
Calcium: 9.6 mg/dL (ref 8.9–10.3)
GFR calc non Af Amer: >60 mL/min (ref >60)
GLUCOSE: 192 mg/dL — AB (ref 65–99)
Potassium: 4.3 mmol/L (ref 3.5–5.1)
Sodium: 134 mmol/L — ABNORMAL LOW (ref 135–145)

## 2017-05-05 LAB — CBC
HEMATOCRIT: 46.2 % (ref 40.0–52.0)
Hemoglobin: 16.1 g/dL (ref 13.0–18.0)
MCH: 31.2 pg (ref 26.0–34.0)
MCHC: 34.8 g/dL (ref 32.0–36.0)
MCV: 89.6 fL (ref 80.0–100.0)
Platelets: 234 10*3/uL (ref 150–440)
RBC: 5.15 MIL/uL (ref 4.40–5.90)
RDW: 14 % (ref 11.5–14.5)
WBC: 10.6 10*3/uL (ref 3.8–10.6)

## 2017-05-05 MED ORDER — HYDROMORPHONE HCL 1 MG/ML IJ SOLN
0.5000 mg | Freq: Once | INTRAMUSCULAR | Status: AC
  Administered 2017-05-05: 0.5 mg via INTRAVENOUS
  Filled 2017-05-05: qty 1

## 2017-05-05 MED ORDER — ONDANSETRON HCL 4 MG/2ML IJ SOLN
4.0000 mg | Freq: Once | INTRAMUSCULAR | Status: AC
  Administered 2017-05-05: 4 mg via INTRAVENOUS
  Filled 2017-05-05: qty 2

## 2017-05-05 NOTE — ED Notes (Signed)
Pt being transported to ultrasound

## 2017-05-05 NOTE — ED Triage Notes (Signed)
Patient c/o left flank pain radiating to back and lower abdomen. Patient reports N/V. Patients reports hx of kidney stones.

## 2017-05-05 NOTE — ED Provider Notes (Addendum)
[facility] Emergency Department Provider Note   ____________________________________________   First MD Initiated Contact with Patient 05/05/17 2323     (approximate)  I have reviewed the triage vital signs and the nursing notes.   HISTORY  Chief Complaint Flank Pain (left)    HPI Dustin Williamson is a 46 y.o. male Patient reports onset of left flank pain radiating down to the groin at 9:00 today. He had some emesis and some nausea. He reports now he is unable to urinate or stool. This feels like his last episode of renal colic. He's had several episodes of renal colic including one where he was obstructed needed a stent. Pain is bad is not made better or worse by anything he's tried.   Past Medical History:  Diagnosis Date  . Anxiety   . Depressed   . Diabetes mellitus without complication ([place])   . Heartburn   . HLD (hyperlipidemia)   . Hypertension   . Kidney stone   . Migraines   . Sleep apnea     Patient Active Problem List   Diagnosis Date Noted  . Hyponatremia 11/15/2016  . OSA (obstructive sleep apnea) 08/12/2016  . HTN (hypertension) 08/12/2016  . Depression 08/12/2016  . Left ureteral stone   . Ureteral obstruction, left 05/22/2016  . Acute left flank pain   . AKI (acute kidney injury) ([place]) 05/04/2016  . Diabetes ([place]) 05/04/2016  . Hypotension 05/04/2016  . Urinary obstruction 05/04/2016  . Hyperglycemia 09/26/2015  . CLOSED FRACTURE OF BASE OF OTHER METACARPAL BONE 03/09/2009    Past Surgical History:  Procedure Laterality Date  . CYSTOSCOPY WITH STENT PLACEMENT N/A 05/21/2016   Performed by Darcella Cheshire, MD at [facility] ORS  . CYSTOSCOPY WITH STENT PLACEMENT Left 05/05/2016   Performed by Hollice Espy, MD at [facility] ORS  . CYSTOSCOPY WITH STENT REMOVAL N/A 05/27/2016   Performed by Darcella Cheshire, MD at [facility] ORS  . HERNIA REPAIR    . KNEE SURGERY Left 2009    Prior to Admission medications   Medication Sig Start Date End  Date Taking? Authorizing Provider  atorvastatin (LIPITOR) 20 MG tablet Take 1 tablet (20 mg total) by mouth daily. 08/13/16  Yes Dustin Flock, MD  insulin aspart (NOVOLOG) 100 UNIT/ML injection Inject 0-20 Units into the skin 3 (three) times daily with meals. 11/16/16  Yes Epifanio Lesches, MD  insulin aspart (NOVOLOG) 100 UNIT/ML injection Inject 0-5 Units into the skin at bedtime. 11/16/16  Yes Epifanio Lesches, MD  insulin glargine (LANTUS) 100 UNIT/ML injection Inject 0.24 mLs (24 Units total) into the skin at bedtime. Patient taking differently: Inject 80 Units into the skin at bedtime.  05/06/16  Yes Wieting, Richard, MD  lisinopril (PRINIVIL,ZESTRIL) 10 MG tablet Take 1 tablet (10 mg total) by mouth daily. 08/13/16  Yes Dustin Flock, MD  metFORMIN (GLUCOPHAGE) 1000 MG tablet Take 1 tablet (1,000 mg total) by mouth 2 (two) times daily with a meal. 08/13/16  Yes Dustin Flock, MD  propranolol (INDERAL) 80 MG tablet Take 1 tablet (80 mg total) by mouth 2 (two) times daily. 08/13/16  Yes Dustin Flock, MD  oxyCODONE-acetaminophen (ROXICET) 5-325 MG tablet Take 1 tablet every 6 (six) hours as needed by mouth. 05/06/17 05/06/18  Nena Polio, MD    Allergies Patient has no known allergies.  Family History  Problem Relation Age of Onset  . Diabetes Mellitus II Father   . Cancer Father   . Multiple myeloma Mother   .  Prostate cancer Neg Hx   . Kidney disease Neg Hx   . Bladder Cancer Neg Hx     Social History Social History   Tobacco Use  . Smoking status: Former Smoker    Last attempt to quit: 06/19/2000    Years since quitting: 16.8  . Smokeless tobacco: Never Used  Substance Use Topics  . Alcohol use: No  . Drug use: No    Comment: noted positive lab tox for cocaine, cannabinoid, and opiates    Review of Systems  Constitutional: No fever/chills Eyes: No visual changes. ENT: No sore throat. Cardiovascular: Denies chest pain. Respiratory: Denies shortness of  breath. Gastrointestinal: see history of present illness Genitourinary:see history of present illness Musculoskeletal: see history of present illness Skin: Negative for rash. Neurological: Negative for headaches, focal weakness   ____________________________________________   PHYSICAL EXAM:  VITAL SIGNS: ED Triage Vitals  Enc Vitals Group     BP 05/05/17 2241 (!) 131/97     Pulse Rate 05/05/17 2241 (!) 112     Resp 05/05/17 2241 18     Temp 05/05/17 2241 97.9 F (36.6 C)     Temp Source 05/05/17 2241 Oral     SpO2 05/05/17 2241 96 %     Weight 05/05/17 2239 279 lb (126.6 kg)     Height --      Head Circumference --      Peak Flow --      Pain Score 05/05/17 2239 10     Pain Loc --      Pain Edu? --      Excl. in [place]? --     Constitutional: Alert and oriented. Well appearing and in no acute distress. Eyes: Conjunctivae are normal.  Head: Atraumatic. Nose: No congestion/rhinnorhea. Mouth/Throat: Mucous membranes are moist.  Oropharynx non-erythematous. Neck: No stridor.  Cardiovascular: Normal rate, regular rhythm. Grossly normal heart sounds.  Good peripheral circulation. Respiratory: Normal respiratory effort.  No retractions. Lungs CTAB. Gastrointestinal: Soft left sided tenderness to palpation. No distention. No abdominal bruits.left CVA tenderness. Musculoskeletal: No lower extremity tenderness nor edema.  No joint effusions. Neurologic:  Normal speech and language. No gross focal neurologic deficits are appreciated. No gait instability. Skin:  Skin is warm, dry and intact. No rash noted. Psychiatric: Mood and affect are normal. Speech and behavior are normal.  ____________________________________________   LABS (all labs ordered are listed, but only abnormal results are displayed)  Labs Reviewed  URINALYSIS, COMPLETE (UACMP) WITH MICROSCOPIC - Abnormal; Notable for the following components:      Result Value   Color, Urine YELLOW (*)    APPearance CLEAR (*)      Specific Gravity, Urine 1.037 (*)    Glucose, UA >=500 (*)    Protein, ur 30 (*)    All other components within normal limits  BASIC METABOLIC PANEL - Abnormal; Notable for the following components:   Sodium 134 (*)    CO2 20 (*)    Glucose, Bld 192 (*)    Creatinine, Ser 1.26 (*)    All other components within normal limits  HEPATIC FUNCTION PANEL - Abnormal; Notable for the following components:   AST 76 (*)    ALT 89 (*)    Bilirubin, Direct <0.1 (*)    All other components within normal limits  URINE CULTURE  CBC  LIPASE, BLOOD   ____________________________________________  EKG   ____________________________________________  RADIOLOGY   ____________________________________________   PROCEDURES  Procedure(s) performed:   Procedures  Critical  Care performed:   ____________________________________________   INITIAL IMPRESSION / ASSESSMENT AND PLAN / ED COURSE  As part of my medical decision making, I reviewed the following data within the [facility] records reviewed and evidence of previous renal colic found    CT and other studies show no evidence of Y the patient's having pain. Nurse feels he may be drug seeking. This may be true but he really hasn't had a prescription for anything since February 2018 patient does seem to calm down a lot when I told while I can't find   ____________________________________________   FINAL CLINICAL IMPRESSION(S) / ED DIAGNOSES  Final diagnoses:  Left flank pain     ED Discharge Orders        Ordered    oxyCODONE-acetaminophen (ROXICET) 5-325 MG tablet  Every 6 hours PRN     05/06/17 0348       Note:  This document was prepared using Dragon voice recognition software and may include unintentional dictation errors.    Nena Polio, MD 05/06/17 4712    Nena Polio, MD 05/06/17 [phone]

## 2017-05-05 NOTE — ED Notes (Signed)
Pt states onset of left sided flank pain that is now radiating to left groin at 2100 today. Pt states at 2130 had an episode of emesis and nausea. Pt states last urinated with bowel movement immediately prior to onset of pain. Pt with pwd skin, resps unlabored. Pt states history of renal calculi. Pt states has been unable to urinate since onset of pain.

## 2017-05-06 ENCOUNTER — Emergency Department: Payer: Self-pay

## 2017-05-06 ENCOUNTER — Encounter: Payer: Self-pay | Admitting: Radiology

## 2017-05-06 LAB — HEPATIC FUNCTION PANEL
ALBUMIN: 4.2 g/dL (ref 3.5–5.0)
ALT: 89 U/L — AB (ref 17–63)
AST: 76 U/L — AB (ref 15–41)
Alkaline Phosphatase: 89 U/L (ref 38–126)
Bilirubin, Direct: <0.1 mg/dL — ABNORMAL LOW (ref 0.1–0.5)
TOTAL PROTEIN: 8 g/dL (ref 6.5–8.1)
Total Bilirubin: 0.7 mg/dL (ref 0.3–1.2)

## 2017-05-06 LAB — URINALYSIS, COMPLETE (UACMP) WITH MICROSCOPIC
BACTERIA UA: NONE SEEN
Bilirubin Urine: NEGATIVE
Glucose, UA: >=500 mg/dL — AB
HGB URINE DIPSTICK: NEGATIVE
Ketones, ur: NEGATIVE mg/dL
Leukocytes, UA: NEGATIVE
NITRITE: NEGATIVE
Protein, ur: 30 mg/dL — AB
SPECIFIC GRAVITY, URINE: 1.037 — AB (ref 1.005–1.030)
SQUAMOUS EPITHELIAL / LPF: NONE SEEN
pH: 5 (ref 5.0–8.0)

## 2017-05-06 LAB — LIPASE, BLOOD: Lipase: 37 U/L (ref 11–51)

## 2017-05-06 MED ORDER — IOPAMIDOL (ISOVUE-300) INJECTION 61%
30.0000 mL | Freq: Once | INTRAVENOUS | Status: AC
  Administered 2017-05-06: 30 mL via ORAL

## 2017-05-06 MED ORDER — OXYCODONE-ACETAMINOPHEN 5-325 MG PO TABS: 1.0000 | ORAL_TABLET | Freq: Four times a day (QID) | ORAL | 0 refills | Status: AC | PRN

## 2017-05-06 MED ORDER — OXYCODONE-ACETAMINOPHEN 5-325 MG PO TABS
1.0000 | ORAL_TABLET | Freq: Once | ORAL | Status: AC
  Administered 2017-05-06: 1 via ORAL
  Filled 2017-05-06: qty 1

## 2017-05-06 MED ORDER — IOPAMIDOL (ISOVUE-300) INJECTION 61%
100.0000 mL | Freq: Once | INTRAVENOUS | Status: AC | PRN
  Administered 2017-05-06: 100 mL via INTRAVENOUS

## 2017-05-06 NOTE — ED Notes (Signed)
In to discharge pt home; he says he is pretty sure the MD said he was going to give him something else for pain before leaving; spoke with Dr Darnelle Catalanmalinda and verbal order given for percocet

## 2017-05-06 NOTE — ED Notes (Signed)
Pt requesting more nausea and pain medication. Will notify md.

## 2017-05-06 NOTE — ED Notes (Signed)
Pt says his pain is 10/10 "because I ain't seen anybody in here since 2am";

## 2017-05-06 NOTE — ED Notes (Signed)
Patient transported to Ultrasound 

## 2017-05-06 NOTE — Discharge Instructions (Signed)
I am not sure what's causing the abdominal pain. I will give you a little bit of pain medicine tif you need it. Please follow-up with your regular doctor. Return for worse pain fever vomiting.

## 2017-05-06 NOTE — ED Notes (Signed)
md aware of pt's request for additional pain medication. No new orders received. Pt watching tv in no acute distress. Call bell at right side.

## 2017-05-06 NOTE — ED Notes (Signed)
Report to laurie, rn.  

## 2017-05-08 LAB — URINE CULTURE: Culture: NO GROWTH

## 2017-05-20 ENCOUNTER — Emergency Department
Admission: EM | Admit: 2017-05-20 | Discharge: 2017-05-20 | Disposition: A | Discharge status: Home / Self Care | Payer: Self-pay | Attending: Emergency Medicine | Admitting: Emergency Medicine

## 2017-05-20 ENCOUNTER — Encounter: Payer: Self-pay | Admitting: Emergency Medicine

## 2017-05-20 DIAGNOSIS — Z87891 Personal history of nicotine dependence: Secondary | ICD-10-CM | POA: Insufficient documentation

## 2017-05-20 DIAGNOSIS — R51 Headache: Secondary | ICD-10-CM | POA: Insufficient documentation

## 2017-05-20 DIAGNOSIS — R519 Headache, unspecified: Secondary | ICD-10-CM

## 2017-05-20 DIAGNOSIS — Z794 Long term (current) use of insulin: Secondary | ICD-10-CM | POA: Insufficient documentation

## 2017-05-20 DIAGNOSIS — Z79899 Other long term (current) drug therapy: Secondary | ICD-10-CM | POA: Insufficient documentation

## 2017-05-20 DIAGNOSIS — I10 Essential (primary) hypertension: Secondary | ICD-10-CM | POA: Insufficient documentation

## 2017-05-20 DIAGNOSIS — E119 Type 2 diabetes mellitus without complications: Secondary | ICD-10-CM | POA: Insufficient documentation

## 2017-05-20 MED ORDER — DIPHENHYDRAMINE HCL 50 MG/ML IJ SOLN
25.0000 mg | Freq: Once | INTRAMUSCULAR | Status: AC
  Administered 2017-05-20: 25 mg via INTRAVENOUS
  Filled 2017-05-20: qty 1

## 2017-05-20 MED ORDER — DIPHENHYDRAMINE HCL 50 MG/ML IJ SOLN
INTRAMUSCULAR | Status: AC
  Administered 2017-05-20: 25 mg via INTRAVENOUS
  Filled 2017-05-20: qty 1

## 2017-05-20 MED ORDER — KETOROLAC TROMETHAMINE 30 MG/ML IJ SOLN
INTRAMUSCULAR | Status: AC
  Administered 2017-05-20: 15 mg via INTRAVENOUS
  Filled 2017-05-20: qty 1

## 2017-05-20 MED ORDER — PROCHLORPERAZINE EDISYLATE 5 MG/ML IJ SOLN
10.0000 mg | Freq: Once | INTRAMUSCULAR | Status: AC
  Administered 2017-05-20: 10 mg via INTRAVENOUS
  Filled 2017-05-20: qty 2

## 2017-05-20 MED ORDER — PROCHLORPERAZINE EDISYLATE 5 MG/ML IJ SOLN
INTRAMUSCULAR | Status: AC
  Administered 2017-05-20: 10 mg via INTRAVENOUS
  Filled 2017-05-20: qty 2

## 2017-05-20 MED ORDER — KETOROLAC TROMETHAMINE 30 MG/ML IJ SOLN
15.0000 mg | Freq: Once | INTRAMUSCULAR | Status: AC
  Administered 2017-05-20: 15 mg via INTRAVENOUS

## 2017-05-20 NOTE — ED Provider Notes (Signed)
[facility] Emergency Department Provider Note  ____________________________________________   First MD Initiated Contact with Patient 05/20/17 1950     (approximate)  I have reviewed the triage vital signs and the nursing notes.   HISTORY  Chief Complaint Migraine    HPI Dustin Williamson is a 46 y.o. male self presents to the emergency department with roughly 48 hours of gradual onset not maximal onset left occipital throbbing headache associated with nausea that is similar to previous migraine headaches.  He normally takes propranolol every day to help prevent his headaches however he forgot his morning dose on Friday.  He also normally drinks a large amount of caffeine every day and he has not had any caffeine since Friday.  He has no neck pain or stiffness.  No numbness or weakness.  No chest pain or shortness of breath.  His headache is nonradiating.  Nothing seems to make it better or worse.  Past Medical History:  Diagnosis Date  . Anxiety   . Depressed   . Diabetes mellitus without complication ([place])   . Heartburn   . HLD (hyperlipidemia)   . Hypertension   . Kidney stone   . Migraines   . Sleep apnea     Patient Active Problem List   Diagnosis Date Noted  . Hyponatremia 11/15/2016  . OSA (obstructive sleep apnea) 08/12/2016  . HTN (hypertension) 08/12/2016  . Depression 08/12/2016  . Left ureteral stone   . Ureteral obstruction, left 05/22/2016  . Acute left flank pain   . AKI (acute kidney injury) ([place]) 05/04/2016  . Diabetes ([place]) 05/04/2016  . Hypotension 05/04/2016  . Urinary obstruction 05/04/2016  . Hyperglycemia 09/26/2015  . CLOSED FRACTURE OF BASE OF OTHER METACARPAL BONE 03/09/2009    Past Surgical History:  Procedure Laterality Date  . CYSTOSCOPY W/ URETERAL STENT REMOVAL N/A 05/27/2016   Procedure: CYSTOSCOPY WITH STENT REMOVAL;  Surgeon: Darcella Cheshire, MD;  Location: [facility] ORS;  Service: Urology;  Laterality: N/A;  .  CYSTOSCOPY WITH STENT PLACEMENT Left 05/05/2016   Procedure: CYSTOSCOPY WITH STENT PLACEMENT;  Surgeon: Hollice Espy, MD;  Location: [facility] ORS;  Service: Urology;  Laterality: Left;  . HERNIA REPAIR    . KNEE SURGERY Left 2009    Prior to Admission medications   Medication Sig Start Date End Date Taking? Authorizing Provider  atorvastatin (LIPITOR) 20 MG tablet Take 1 tablet (20 mg total) by mouth daily. 08/13/16   Dustin Flock, MD  insulin aspart (NOVOLOG) 100 UNIT/ML injection Inject 0-20 Units into the skin 3 (three) times daily with meals. 11/16/16   Epifanio Lesches, MD  insulin aspart (NOVOLOG) 100 UNIT/ML injection Inject 0-5 Units into the skin at bedtime. 11/16/16   Epifanio Lesches, MD  insulin glargine (LANTUS) 100 UNIT/ML injection Inject 0.24 mLs (24 Units total) into the skin at bedtime. Patient taking differently: Inject 80 Units into the skin at bedtime.  05/06/16   Loletha Grayer, MD  lisinopril (PRINIVIL,ZESTRIL) 10 MG tablet Take 1 tablet (10 mg total) by mouth daily. 08/13/16   Dustin Flock, MD  metFORMIN (GLUCOPHAGE) 1000 MG tablet Take 1 tablet (1,000 mg total) by mouth 2 (two) times daily with a meal. 08/13/16   Dustin Flock, MD  oxyCODONE-acetaminophen (ROXICET) 5-325 MG tablet Take 1 tablet every 6 (six) hours as needed by mouth. 05/06/17 05/06/18  Nena Polio, MD  propranolol (INDERAL) 80 MG tablet Take 1 tablet (80 mg total) by mouth 2 (two) times daily. 08/13/16  Dustin Flock, MD    Allergies Patient has no known allergies.  Family History  Problem Relation Age of Onset  . Diabetes Mellitus II Father   . Cancer Father   . Multiple myeloma Mother   . Prostate cancer Neg Hx   . Kidney disease Neg Hx   . Bladder Cancer Neg Hx     Social History Social History   Tobacco Use  . Smoking status: Former Smoker    Last attempt to quit: 06/19/2000    Years since quitting: 16.9  . Smokeless tobacco: Never Used  Substance Use Topics  .  Alcohol use: No  . Drug use: No    Comment: noted positive lab tox for cocaine, cannabinoid, and opiates    Review of Systems Constitutional: No fever/chills Eyes: No visual changes. ENT: No sore throat. Cardiovascular: Denies chest pain. Respiratory: Denies shortness of breath. Gastrointestinal: No abdominal pain.  Positive for nausea, no vomiting.  No diarrhea.  No constipation. Genitourinary: Negative for dysuria. Musculoskeletal: Negative for back pain. Skin: Negative for rash. Neurological: Positive for headache   ____________________________________________   PHYSICAL EXAM:  VITAL SIGNS: ED Triage Vitals  Enc Vitals Group     BP 05/20/17 1935 (!) 167/114     Pulse Rate 05/20/17 1935 (!) 102     Resp 05/20/17 1935 18     Temp 05/20/17 1935 98.3 F (36.8 C)     Temp Source 05/20/17 1935 Oral     SpO2 05/20/17 1935 96 %     Weight 05/20/17 1934 280 lb (127 kg)     Height 05/20/17 1934 5' 6"  (1.676 m)     Head Circumference --      Peak Flow --      Pain Score 05/20/17 1934 10     Pain Loc --      Pain Edu? --      Excl. in [place]? --     Constitutional: Alert and oriented x4 appears somewhat clear symptoms Eyes: PERRL EOMI. Head: Atraumatic. Nose: No congestion/rhinnorhea. Mouth/Throat: No trismus Neck: No stridor.   Cardiovascular: Tachycardic rate, regular rhythm. Grossly normal heart sounds.  Good peripheral circulation. Respiratory: Normal respiratory effort.  No retractions. Lungs CTAB and moving good air Gastrointestinal: Obese soft nontender Musculoskeletal: No lower extremity edema   Neurologic:  Normal speech and language. No gross focal neurologic deficits are appreciated. Skin:  Skin is warm, dry and intact. No rash noted. Psychiatric: Mood and affect are normal. Speech and behavior are normal.    ____________________________________________   DIFFERENTIAL includes but not limited to  Migraine headache, cervical artery dissection, temporal  arteritis, glaucoma, caffeine withdrawal ____________________________________________   LABS (all labs ordered are listed, but only abnormal results are displayed)  Labs Reviewed - No data to display   __________________________________________  EKG   ____________________________________________  RADIOLOGY   ____________________________________________   PROCEDURES  Procedure(s) performed: no  Procedures  Critical Care performed: no  Observation: no ____________________________________________   INITIAL IMPRESSION / ASSESSMENT AND PLAN / ED COURSE  Pertinent labs & imaging results that were available during my care of the patient were reviewed by me and considered in my medical decision making (see chart for details).  The patient arrives uncomfortable appearing with gradual onset not maximal onset headache that is similar to previous headaches.  He said that a migraine cocktail in the past has relieved his symptoms.  When he was here 3 months ago he had a CT angiogram of his head and neck  which were negative.  I will treat him symptomatically now and reevaluate.     ----------------------------------------- 9:01 PM on 05/20/2017 -----------------------------------------  Patient's pain is resolved and he would like to go home.  He has never been referred to a neurologist so we will make the referral.  Strict return precautions and given he verbalized understanding and agreement the plan. ____________________________________________   FINAL CLINICAL IMPRESSION(S) / ED DIAGNOSES  Final diagnoses:  Nonintractable headache, unspecified chronicity pattern, unspecified headache type      NEW MEDICATIONS STARTED DURING THIS VISIT:  This SmartLink is deprecated. Use AVSMEDLIST instead to display the medication list for a patient.   Note:  This document was prepared using Dragon voice recognition software and may include unintentional dictation errors.       Darel Hong, MD 05/20/17 2101

## 2017-05-20 NOTE — ED Triage Notes (Addendum)
Patient states that he missed his morning dose of bp medication on Friday and developed a migraine after that. Patient states that he has taken medication tylenol for the migraine with no improvement. Patient states that this feels the same as his previous migraines.

## 2017-05-20 NOTE — Discharge Instructions (Signed)
Please make an appointment to establish care with a neurologist within the next month or 2 for recheck.  Return to the emergency department sooner for any concerns.  It was a pleasure to take care of you today, and thank you for coming to our emergency department.  If you have any questions or concerns before leaving please ask the nurse to grab me and I'm more than happy to go through your aftercare instructions again.  If you were prescribed any opioid pain medication today such as Norco, Vicodin, Percocet, morphine, hydrocodone, or oxycodone please make sure you do not drive when you are taking this medication as it can alter your ability to drive safely.  If you have any concerns once you are home that you are not improving or are in fact getting worse before you can make it to your follow-up appointment, please do not hesitate to call [phone] and come back for further evaluation.  Merrily BrittleNeil Antoney Biven, MD  Results for orders placed or performed during the hospital encounter of 05/05/17  Urine culture  Result Value Ref Range   Specimen Description URINE, CLEAN CATCH    Special Requests NONE    Culture      NO GROWTH Performed at [facility], [address] [place], [place] [address]    Report Status 05/08/2017 FINAL   Urinalysis, Complete w Microscopic  Result Value Ref Range   Color, Urine YELLOW (A) YELLOW   APPearance CLEAR (A) CLEAR   Specific Gravity, Urine 1.037 (H) 1.005 - 1.030   pH 5.0 5.0 - 8.0   Glucose, UA >=500 (A) NEGATIVE mg/dL   Hgb urine dipstick NEGATIVE NEGATIVE   Bilirubin Urine NEGATIVE NEGATIVE   Ketones, ur NEGATIVE NEGATIVE mg/dL   Protein, ur 30 (A) NEGATIVE mg/dL   Nitrite NEGATIVE NEGATIVE   Leukocytes, UA NEGATIVE NEGATIVE   RBC / HPF 0-5 0 - 5 RBC/hpf   WBC, UA 0-5 0 - 5 WBC/hpf   Bacteria, UA NONE SEEN NONE SEEN   Squamous Epithelial / LPF NONE SEEN NONE SEEN   Mucus PRESENT   CBC  Result Value Ref Range   WBC 10.6 3.8 - 10.6 K/uL   RBC 5.15  4.40 - 5.90 MIL/uL   Hemoglobin 16.1 13.0 - 18.0 g/dL   HCT 10.246.2 72.540.0 - 36.652.0 %   MCV 89.6 80.0 - 100.0 fL   MCH 31.2 26.0 - 34.0 pg   MCHC 34.8 32.0 - 36.0 g/dL   RDW 44.014.0 34.711.5 - 42.514.5 %   Platelets 234 150 - 440 K/uL  Basic metabolic panel  Result Value Ref Range   Sodium 134 (L) 135 - 145 mmol/L   Potassium 4.3 3.5 - 5.1 mmol/L   Chloride 102 101 - 111 mmol/L   CO2 20 (L) 22 - 32 mmol/L   Glucose, Bld 192 (H) 65 - 99 mg/dL   BUN 16 6 - 20 mg/dL   Creatinine, Ser 9.561.26 (H) 0.61 - 1.24 mg/dL   Calcium 9.6 8.9 - 38.710.3 mg/dL   GFR calc non Af Amer >60 >60 mL/min   GFR calc Af Amer >60 >60 mL/min   Anion gap 12 5 - 15  Hepatic function panel  Result Value Ref Range   Total Protein 8.0 6.5 - 8.1 g/dL   Albumin 4.2 3.5 - 5.0 g/dL   AST 76 (H) 15 - 41 U/L   ALT 89 (H) 17 - 63 U/L   Alkaline Phosphatase 89 38 - 126 U/L  Total Bilirubin 0.7 0.3 - 1.2 mg/dL   Bilirubin, Direct <1.6<0.1 (L) 0.1 - 0.5 mg/dL   Indirect Bilirubin NOT CALCULATED 0.3 - 0.9 mg/dL  Lipase, blood  Result Value Ref Range   Lipase 37 11 - 51 U/L   Ct Abdomen Pelvis W Contrast  Result Date: 05/06/2017 CLINICAL DATA:  Left flank pain radiating to the back and lower abdomen. EXAM: CT ABDOMEN AND PELVIS WITH CONTRAST TECHNIQUE: Multidetector CT imaging of the abdomen and pelvis was performed using the standard protocol following bolus administration of intravenous contrast. CONTRAST:  100mL ISOVUE-300 IOPAMIDOL (ISOVUE-300) INJECTION 61% COMPARISON:  None. FINDINGS: Lower chest: Normal sized heart without pericardial effusion. Dependent atelectasis at each lung base with mild extrapleural fat noted along the periphery of the right lower lobe. Hepatobiliary: Diffuse hepatic steatosis. No mass or biliary dilatation. Contracted gallbladder without stones. Pancreas: Normal Spleen: Normal Adrenals/Urinary Tract: Normal bilateral adrenal glands and kidneys. Nonobstructing right-sided lower pole renal calculus and tiny 7 mm renal  cyst. No hydroureteronephrosis. Unremarkable bladder. Stomach/Bowel: Stomach is within normal limits. Appendix appears normal. No evidence of bowel wall thickening, distention, or inflammatory changes. Vascular/Lymphatic: No significant vascular findings are present. No enlarged abdominal or pelvic lymph nodes. Reproductive: Prostate is unremarkable. Other: Status post left inguinal hernia repair. Tiny umbilical hernia containing fat. Musculoskeletal: No worrisome osseous lesions. Mild multilevel degenerative disc disease of the included lower thoracic spine. IMPRESSION: 1. Hepatic steatosis. 2. Punctate nonobstructing right lower pole renal calculus and 7 mm cyst too small to further characterize. No obstructive uropathy. 3. Mild multilevel degenerative disc disease of the lower thoracic spine. Electronically Signed   By: Tollie Ethavid  Kwon M.D.   On: 05/06/2017 01:55   [place] Renal  Result Date: 05/06/2017 CLINICAL DATA:  Acute onset of left flank pain, radiating to the back and lower abdomen. EXAM: RENAL / URINARY TRACT ULTRASOUND COMPLETE COMPARISON:  Renal ultrasound performed 08/22/2016, and CT of the abdomen and pelvis from 05/21/2016 FINDINGS: Right Kidney: Length: 11.6 cm. Echogenicity within normal limits. An 8 mm stone is noted at the interpole region of the right kidney. No mass or hydronephrosis visualized. Left Kidney: Length: 11.5 cm. Echogenicity within normal limits. No mass or hydronephrosis visualized. Bladder: Appears normal for degree of bladder distention. IMPRESSION: 1. No evidence of hydronephrosis. 2. 8 mm nonobstructing stone at the interpole region of the right kidney. Electronically Signed   By: Roanna RaiderJeffery  Chang M.D.   On: 05/06/2017 00:28

## 2017-06-18 ENCOUNTER — Ambulatory Visit: Payer: Self-pay

## 2017-06-18 ENCOUNTER — Other Ambulatory Visit: Payer: Self-pay

## 2017-06-18 ENCOUNTER — Telehealth: Payer: Self-pay | Admitting: Pharmacist

## 2017-06-18 ENCOUNTER — Encounter (INDEPENDENT_AMBULATORY_CARE_PROVIDER_SITE_OTHER): Payer: Self-pay

## 2017-06-18 VITALS — BP 142/78 | HR 72 | Ht 66.0 in | Wt 288.0 lb

## 2017-06-18 DIAGNOSIS — Z79899 Other long term (current) drug therapy: Secondary | ICD-10-CM

## 2017-06-18 NOTE — Progress Notes (Signed)
MEDICATION THERAPY MANAGEMENT   Dustin DowningJeffrey Williamson is a 46 yo male seen in the medication management clinic by the pharmacist to receive medication therapy management.   Prior to Admission medications   Medication Sig Start Date End Date Taking? Authorizing Provider  albuterol (PROVENTIL HFA;VENTOLIN HFA) 108 (90 Base) MCG/ACT inhaler Inhale 2 puffs into the lungs every 4 (four) hours as needed for wheezing or shortness of breath.   Yes [provider]  amitriptyline (ELAVIL) 25 MG tablet Take 50 mg by mouth at bedtime.   Yes [provider]  aspirin 81 MG chewable tablet Chew 81 mg by mouth daily.   Yes [provider]  atorvastatin (LIPITOR) 20 MG tablet Take 1 tablet (20 mg total) by mouth daily. 08/13/16  Yes Dustin BilberryPatel, Shreyang, MD  cyclobenzaprine (FLEXERIL) 10 MG tablet Take 10 mg by mouth 3 (three) times daily as needed for muscle spasms.   Yes [provider]  FLUoxetine (PROZAC) 20 MG capsule Take 20 mg by mouth at bedtime.   Yes [provider]  gabapentin (NEURONTIN) 400 MG capsule Take 1,200 mg by mouth 2 (two) times daily.   Yes [provider]  insulin aspart (NOVOLOG) 100 UNIT/ML injection Inject 10 Units into the skin 3 (three) times daily with meals.   Yes [provider]  insulin glargine (LANTUS) 100 UNIT/ML injection Inject 0.24 mLs (24 Units total) into the skin at bedtime. Patient taking differently: Inject 65 Units into the skin 2 (two) times daily.  05/06/16  Yes Williamson, Richard, MD  lisinopril (PRINIVIL,ZESTRIL) 10 MG tablet Take 1 tablet (10 mg total) by mouth daily. 08/13/16  Yes Dustin BilberryPatel, Shreyang, MD  metFORMIN (GLUCOPHAGE) 1000 MG tablet Take 1 tablet (1,000 mg total) by mouth 2 (two) times daily with a meal. 08/13/16  Yes Dustin BilberryPatel, Shreyang, MD  naproxen (NAPROSYN) 500 MG tablet Take 500 mg by mouth 2 (two) times daily with a meal.   Yes [provider]  omeprazole (PRILOSEC) 20 MG capsule Take 20 mg by mouth  daily.   Yes [provider]  propranolol (INDERAL) 80 MG tablet Take 1 tablet (80 mg total) by mouth 2 (two) times daily. 08/13/16  Yes Dustin BilberryPatel, Shreyang, MD  insulin aspart (NOVOLOG) 100 UNIT/ML injection Inject 0-20 Units into the skin 3 (three) times daily with meals. Patient not taking: Reported on 06/18/2017 11/16/16   Dustin HammingKonidena, Snehalatha, MD  insulin aspart (NOVOLOG) 100 UNIT/ML injection Inject 0-5 Units into the skin at bedtime. Patient not taking: Reported on 06/18/2017 11/16/16   Dustin HammingKonidena, Snehalatha, MD  oxyCODONE-acetaminophen (ROXICET) 5-325 MG tablet Take 1 tablet every 6 (six) hours as needed by mouth. Patient not taking: Reported on 06/18/2017 05/06/17 05/06/18  Arnaldo NatalMalinda, Dustin F, MD   Vitals:   06/18/17 1629  BP: (!) 142/78  Pulse: 72    Diet Typically eats 3 times a day.  Breakfast - cereal or eggs and [place] bacon on toast Lunch - banana sandwiches, PB sandwiches, chips Dinner - eats fastfood 3x a week ([employer], [employer], [employer]); otherwise eating homecooked meals  Snacks - doesn't typically eat. Eats pistachios.   Spoke with Dustin Williamson about diet. Overall he is eating fairly well and working on a healthier diet. He states that he eats out a few times a week but that when he does he typically tries to eat grilled chicken.   Exercise  Dustin Williamson stated that he is trying to get a weight bench so that he can start exercising. His neuropathy is  fairly severe and prevents him from doing a lot of exercise.   Health Maintenance/Date Completed  Last ED visit: November 2018 Last Visit to PCP: Dr. Letta PateAycock, December 2018 Next Visit to PCP: July 15, 2016, will get set up with therapist and nutritionist Specialist Visit: Psychiatrist [facility] [place] Exam: Longer than 10 yrs ago  Eye Exam: December 2018 for retina testing, needs to see opthamologist Prostate Exam: Never DEXA: Never Colonoscopy: Never Flu Vaccine: Never taken one Pneumonia Vaccine: Never    Assessment/Plan  Diabetes: novolog, lantus, metformin. Patient stated that he is regularly seeing his PCP Dr. Letta PateAycock who is managing his diabetes. Provider recently increased lantus to 60 units BID and novolog to 10 units TID. Patient was recently hospitalized d/t kidney issues and high blood glucose. His most recent A1c on 10/2016 is 11. Again discussed importance of diet and exercise with patient. He stated that typically his sugars are in the 200 range but that his goal was to get them to the 150's.   Neuropathy: gabapentin, amitriptyline, cyclobenzaprine. Patient stated that neuropathy is not well controlled but is better with cyclobenzaprine as this allows him to sleep. For his Gabapentin, he states that it works better for him to take is twice a day instead of 3 times a day.   CVD/HTN: propranolol, lisinopril, atorvastatin, aspirin. Patient stated that PCP recently started aspirin due to concerns for weight and CVD. Patient BP today was 142/78. Patient states that this is good for him.   Depression/Anxiety: fluoxetine, amitriptyline. Patient states that mood is well controlled. He is concerned because he recently lost access to his psychiatrist but states that social worker is attempting to get him a new psychiatrist. Overall mood is good. States he has not had a depressive thought since starting fluoxetine. States that he has recently had a lot of nightmares.   Migraine: Excedrin. Occasionally takes excedrin to prevent/treat migraines. States that he was on percocet for migraines.   Acid reflux: omeprazole. States that acid reflux is well controlled with omeprazole.   Asthma: albuterol. States that his asthma is well controlled with albuterol. Typically uses twice a day.   Will continue to do medication management. Follow up in 6 months.   Yolanda BonineHannah Kenzlee Fishburn, PharmD Pharmacy Resident

## 2017-06-18 NOTE — Telephone Encounter (Signed)
06/18/17 Faxing refill request to [employer] for Lantus Solostar Max 120 units daily=Inject 55 units under the skin twice a day, increase to 60 units two times a day after 1 week if fasting blood sugar . 150. #4.AJ

## 2017-07-16 ENCOUNTER — Telehealth: Payer: Self-pay | Admitting: Pharmacist

## 2017-07-16 NOTE — Telephone Encounter (Signed)
07/16/17 Mailing [employer] refill request to Dr. Karie FetchNgwe Aycock @ [facility] to sign and mail back.Forde RadonAJ

## 2017-08-01 ENCOUNTER — Telehealth: Payer: Self-pay | Admitting: Pharmacist

## 2017-08-01 NOTE — Telephone Encounter (Signed)
08/01/17 Faxed [employer] a refill request for Lantus Solostar Max daily dose 120 units, #7. Inject 55 units under the skin two times a day, increase to 60 units twice daily after 1 week if fasting blood sugar > 150.Forde RadonAJ

## 2017-08-15 ENCOUNTER — Ambulatory Visit: Payer: Self-pay

## 2017-08-15 ENCOUNTER — Ambulatory Visit: Admission: RE | Admit: 2017-08-15 | Payer: Self-pay | Source: Ambulatory Visit | Admitting: *Deleted

## 2017-08-15 ENCOUNTER — Ambulatory Visit
Admission: RE | Admit: 2017-08-15 | Discharge: 2017-08-15 | Disposition: A | Discharge status: Home / Self Care | Payer: Self-pay | Source: Ambulatory Visit | Attending: Family Medicine | Admitting: Family Medicine

## 2017-08-15 ENCOUNTER — Encounter: Payer: Self-pay | Admitting: Pharmacist

## 2017-08-15 ENCOUNTER — Other Ambulatory Visit: Payer: Self-pay | Admitting: Family Medicine

## 2017-08-15 ENCOUNTER — Ambulatory Visit: Payer: Self-pay | Admitting: Pharmacy Technician

## 2017-08-15 DIAGNOSIS — M25559 Pain in unspecified hip: Secondary | ICD-10-CM

## 2017-08-15 DIAGNOSIS — Z79899 Other long term (current) drug therapy: Secondary | ICD-10-CM

## 2017-08-15 DIAGNOSIS — M25552 Pain in left hip: Secondary | ICD-10-CM | POA: Insufficient documentation

## 2017-08-15 NOTE — Progress Notes (Signed)
  Completed [facility] application for recertification.  Patient provided 2019 proof of income.  Patient approved to receive medication assistance at [facility] through 2019, as long as eligibility criteria continues to be met.  [facility]

## 2017-08-16 ENCOUNTER — Telehealth: Payer: Self-pay | Admitting: Pharmacist

## 2017-08-16 ENCOUNTER — Encounter: Payer: Self-pay | Admitting: Pharmacist

## 2017-08-16 NOTE — Telephone Encounter (Signed)
08/16/2017 12:20:55 PM - Novolog Vial refill  08/16/17 Mailing [employer] refill request to Dr. Karie FetchNgwe Aycock @ [facility] to sign and return for Novolog Vial Inject 6 units under the skin every day with meals (Max daily dose 18 units).Forde RadonAJ

## 2017-08-16 NOTE — Progress Notes (Unsigned)
Medication Management Clinic Visit Note  Patient: Dustin Williamson MRN: [id number] Date of Birth: 1970/08/02 PCP: Dustin Coffin, MD   Dustin Williamson 47 y.o. male presents for a follow up MTM visit at [facility] today with pharmacist.   Patient Information   Past Medical History:  Diagnosis Date  . Anxiety   . Depressed   . Diabetes mellitus without complication ([place])   . Heartburn   . HLD (hyperlipidemia)   . Hypertension   . Kidney stone   . Migraines   . Sleep apnea       Past Surgical History:  Procedure Laterality Date  . CYSTOSCOPY W/ URETERAL STENT REMOVAL N/A 05/27/2016   Procedure: CYSTOSCOPY WITH STENT REMOVAL;  Surgeon: Darcella Cheshire, MD;  Location: [facility] ORS;  Service: Urology;  Laterality: N/A;  . CYSTOSCOPY WITH STENT PLACEMENT Left 05/05/2016   Procedure: CYSTOSCOPY WITH STENT PLACEMENT;  Surgeon: Hollice Espy, MD;  Location: [facility] ORS;  Service: Urology;  Laterality: Left;  . HERNIA REPAIR    . KNEE SURGERY Left 2009     Family History  Problem Relation Age of Onset  . Diabetes Mellitus II Father   . Cancer Father   . Multiple myeloma Mother   . Prostate cancer Neg Hx   . Kidney disease Neg Hx   . Bladder Cancer Neg Hx      Social History   Substance and Sexual Activity  Alcohol Use No   Comment: been 3-4 yrs since last drank       Social History   Tobacco Use  Smoking Status Former Smoker  . Packs/day: 0.50  . Years: 10.00  . Pack years: 5.00  . Last attempt to quit: 06/19/2000  . Years since quitting: 17.1  Smokeless Tobacco Never Used  Tobacco Comment   Hasn't smoked in 15 yrs or so       Health Maintenance  Topic Date Due  . PNEUMOCOCCAL POLYSACCHARIDE VACCINE (1) 07/10/1972  . FOOT EXAM  07/10/1980  . OPHTHALMOLOGY EXAM  07/10/1980  . HIV Screening  07/10/1985  . TETANUS/TDAP  07/10/1989  . INFLUENZA VACCINE  01/17/2017  . HEMOGLOBIN A1C  05/18/2017    Health Maintenance/Date Completed  Last ED visit:  November 2018 Last Visit to PCP: Dr. Clide Williamson, December 2018 Next Visit to PCP: July 15, 2016, will get set up with therapist and nutritionist Specialist Visit: Psychiatrist [facility]) - [place] Dental Exam: Longer than 10 yrs ago  Eye Exam: December 2018 for retina testing, needs to see opthamologist Prostate Exam: Never DEXA: Never Colonoscopy: Never Flu Vaccine: Never taken one Pneumonia Vaccine: Never   Assessment and Plan:  1. Diabetes Type II: Novolog, Lantus, metformin. Uncontrolled; most recent A1c 11 (10/2016). Patient stated his PCP, Dr. Clide Williamson is managing his diabetes. Patient contacted Dr. Clide Williamson office to send [place] nex Rx for Lantus due to recent increase in Lantus dose. Discussed importance of diet and exercise with patient. He states his morning sugars are usually 200-250 mg/dL. Recently experienced a 'low' of 137 mg/dL where he said he did not feel good at all (shakes/dizzy)--drank some mountain dew and sugars went back around 215 and he felt a lot better.   2. Neuropathy: gabapentin, amitriptyline, cyclobenzaprine. Patient stated that his neuropathy is not controlled--cannot feel feet and tingling in hands. Cyclobenzaprine has been helping with his sleep and states gabapentin does not work, but he will continue to take.   3. CVD/HTN: propranolol, lisinopril, atorvastatin, ASA. Patient BP today  in clinic 142/96 mmHg. Patient states this is good for him.   Overall patient says he has been trying to get back in line with his life. Has been drug-free for over 1 year and seeing a new psychiatrist at [place] (was going to [facility]). Says he has a history of alcohol abuse and new psychiatrist would like him to go to AA, but patient states groups of people give him anxiety.   Will follow up with patient in 37month-1year.   SCandelaria Stagers PharmD Pharmacy Resident

## 2017-08-16 NOTE — Progress Notes (Signed)
Medication Management Clinic Visit Note  Patient: Dustin Williamson MRN: [id number] Date of Birth: [birth year] PCP: Donnie Coffin, MD   Dustin Williamson 47 y.o. male presents for a follow up MTM visit at [facility] today with the Pharmacist.   Patient Information   Past Medical History:  Diagnosis Date  . Anxiety   . Depressed   . Diabetes mellitus without complication ([place])   . Heartburn   . HLD (hyperlipidemia)   . Hypertension   . Kidney stone   . Migraines   . Sleep apnea       Past Surgical History:  Procedure Laterality Date  . CYSTOSCOPY W/ URETERAL STENT REMOVAL N/A 05/27/2016   Procedure: CYSTOSCOPY WITH STENT REMOVAL;  Surgeon: Darcella Cheshire, MD;  Location: [facility] ORS;  Service: Urology;  Laterality: N/A;  . CYSTOSCOPY WITH STENT PLACEMENT Left 05/05/2016   Procedure: CYSTOSCOPY WITH STENT PLACEMENT;  Surgeon: Hollice Espy, MD;  Location: [facility] ORS;  Service: Urology;  Laterality: Left;  . HERNIA REPAIR    . KNEE SURGERY Left 2009     Family History  Problem Relation Age of Onset  . Diabetes Mellitus II Father   . Cancer Father   . Multiple myeloma Mother   . Prostate cancer Neg Hx   . Kidney disease Neg Hx   . Bladder Cancer Neg Hx      Social History   Substance and Sexual Activity  Alcohol Use No   Comment: been 3-4 yrs since last drank       Social History   Tobacco Use  Smoking Status Former Smoker  . Packs/day: 0.50  . Years: 10.00  . Pack years: 5.00  . Last attempt to quit: 06/19/2000  . Years since quitting: 17.1  Smokeless Tobacco Never Used  Tobacco Comment   Hasn't smoked in 15 yrs or so       Health Maintenance  Topic Date Due  . PNEUMOCOCCAL POLYSACCHARIDE VACCINE (1) 07/10/1972  . FOOT EXAM  07/10/1980  . OPHTHALMOLOGY EXAM  07/10/1980  . HIV Screening  07/10/1985  . TETANUS/TDAP  07/10/1989  . INFLUENZA VACCINE  01/17/2017  . HEMOGLOBIN A1C  05/18/2017     Assessment and Plan:  1. Diabetes Type II:  Novolog, Lantus, metformin. Uncontrolled; most recent A1c 11 (10/2016). Patient stated his PCP, Dr. Clide Deutscher is managing his diabetes. Patient contacted Dr. Clide Deutscher office to send [place] nex Rx for Lantus due to recent increase in Lantus dose. Discussed importance of diet and exercise with patient. He states his morning sugars are usually 200-250 mg/dL. Recently experienced a 'low' of 137 mg/dL where he said he did not feel good at all (shakes/dizzy)--drank some mountain dew and sugars went back around 215 and he felt a lot better.   2. Neuropathy: gabapentin, amitriptyline, cyclobenzaprine. Patient stated that his neuropathy is not controlled--cannot feel feet and tingling in hands. Cyclobenzaprine has been helping with his sleep and states gabapentin does not work, but he will continue to take.   3. CVD/HTN: propranolol, lisinopril, atorvastatin, ASA. Patient BP today in clinic 142/96 mmHg. Patient states this is good for him.   Overall patient says he has been trying to get back in line with his life. Has been drug-free for over 1 year and seeing a new psychiatrist at [place] (was going to [facility]). Says he has a history of alcohol abuse and new psychiatrist would like him to go to AA, but patient states groups of people give him  anxiety.   Will follow up with patient in 8month-1year.   SCandelaria Stagers PharmD Pharmacy Resident

## 2017-08-24 ENCOUNTER — Encounter: Payer: Self-pay | Admitting: Urology

## 2017-08-24 ENCOUNTER — Ambulatory Visit: Payer: Self-pay | Admitting: Urology

## 2017-09-04 ENCOUNTER — Telehealth: Payer: Self-pay | Admitting: Pharmacist

## 2017-09-04 NOTE — Telephone Encounter (Signed)
09/04/2017 3:25:22 PM - Novolog Vials  09/04/17 Faxed [employer] a refill request for [employer] Vials Inject 6 units under the skin every day with meals--Max daily dose 18 units.Forde RadonAJ

## 2017-09-04 NOTE — Telephone Encounter (Signed)
09/04/2017 3:22:36 PM - Lantus Solostar Dose increase  09/04/17 I have received a notice from [employer] time for refill, also there was a Dose Increase-Inject 70 units under the skin two times a day #9, Max daily dose 140 units, printed refill request and mailing to provider @ [facility] to sign and return to us.Forde RadonAJ

## 2017-09-14 ENCOUNTER — Telehealth: Payer: Self-pay | Admitting: Pharmacist

## 2017-09-14 NOTE — Telephone Encounter (Signed)
09/14/2017 8:26:12 AM - Lantus Solostar refill  09/14/17 Faxed [employer] a refill request for Lantus Solostar (dose change) Inject 70 units under the skin two times a day #9 Max daily dose 140 units.Forde RadonAJ

## 2017-09-17 ENCOUNTER — Telehealth: Payer: Self-pay | Admitting: Pharmacist

## 2017-09-17 NOTE — Telephone Encounter (Signed)
09/17/2017 8:44:37 AM - Ventolin HFA refill  09/17/17 Placed refill online with [employer] for Ventolin HFA, to release 09/28/17, order# [id number].Forde RadonAJ

## 2017-10-22 ENCOUNTER — Telehealth: Payer: Self-pay | Admitting: Pharmacist

## 2017-10-22 NOTE — Telephone Encounter (Signed)
10/22/2017 10:30:00 AM - Lantus Solostar refaxed to [employer]  10/22/17 Called [employer] spoke with [place], she stating patient enrolled till 04/16/18. Last shipped meds in Jan. 2019. I asked about the refill request I faxed 09/14/17 for Dose Change-she stated at the time they received (they did not process as dose change-and therefore not due then.) She stated if I refax now they will process-(she did something on their end for a different time stamp). I have refaxed and hopefully this will go through for processing and shipment.Forde Radon

## 2018-02-21 ENCOUNTER — Emergency Department
Admission: EM | Admit: 2018-02-21 | Discharge: 2018-02-21 | Disposition: A | Discharge status: Home / Self Care | Payer: Self-pay | Attending: Emergency Medicine | Admitting: Emergency Medicine

## 2018-02-21 ENCOUNTER — Other Ambulatory Visit: Payer: Self-pay

## 2018-02-21 ENCOUNTER — Emergency Department: Payer: Self-pay

## 2018-02-21 DIAGNOSIS — Z794 Long term (current) use of insulin: Secondary | ICD-10-CM | POA: Insufficient documentation

## 2018-02-21 DIAGNOSIS — E119 Type 2 diabetes mellitus without complications: Secondary | ICD-10-CM | POA: Insufficient documentation

## 2018-02-21 DIAGNOSIS — Z7982 Long term (current) use of aspirin: Secondary | ICD-10-CM | POA: Insufficient documentation

## 2018-02-21 DIAGNOSIS — E875 Hyperkalemia: Secondary | ICD-10-CM

## 2018-02-21 DIAGNOSIS — Z79899 Other long term (current) drug therapy: Secondary | ICD-10-CM | POA: Insufficient documentation

## 2018-02-21 DIAGNOSIS — Z87891 Personal history of nicotine dependence: Secondary | ICD-10-CM | POA: Insufficient documentation

## 2018-02-21 DIAGNOSIS — I10 Essential (primary) hypertension: Secondary | ICD-10-CM | POA: Insufficient documentation

## 2018-02-21 LAB — BASIC METABOLIC PANEL
Anion gap: 8 (ref 5–15)
Anion gap: 5 (ref 5–15)
BUN: 28 mg/dL — ABNORMAL HIGH (ref 6–20)
BUN: 25 mg/dL — ABNORMAL HIGH (ref 6–20)
CALCIUM: 8.9 mg/dL (ref 8.9–10.3)
CHLORIDE: 99 mmol/L (ref 98–111)
CHLORIDE: 101 mmol/L (ref 98–111)
CO2: 23 mmol/L (ref 22–32)
CO2: 27 mmol/L (ref 22–32)
CREATININE: 1.55 mg/dL — AB (ref 0.61–1.24)
Calcium: 9.3 mg/dL (ref 8.9–10.3)
Creatinine, Ser: 1.51 mg/dL — ABNORMAL HIGH (ref 0.61–1.24)
GFR calc Af Amer: 60 mL/min — ABNORMAL LOW (ref >60)
GFR calc non Af Amer: 53 mL/min — ABNORMAL LOW (ref >60)
GFR, EST NON AFRICAN AMERICAN: 52 mL/min — AB (ref >60)
Glucose, Bld: 298 mg/dL — ABNORMAL HIGH (ref 70–99)
Glucose, Bld: 134 mg/dL — ABNORMAL HIGH (ref 70–99)
POTASSIUM: 6.1 mmol/L — AB (ref 3.5–5.1)
POTASSIUM: 4.6 mmol/L (ref 3.5–5.1)
SODIUM: 130 mmol/L — AB (ref 135–145)
SODIUM: 133 mmol/L — AB (ref 135–145)

## 2018-02-21 LAB — BRAIN NATRIURETIC PEPTIDE: B Natriuretic Peptide: 5 pg/mL (ref 0.0–100.0)

## 2018-02-21 LAB — URINALYSIS, COMPLETE (UACMP) WITH MICROSCOPIC
Bacteria, UA: NONE SEEN
Bilirubin Urine: NEGATIVE
GLUCOSE, UA: 50 mg/dL — AB
HGB URINE DIPSTICK: NEGATIVE
Ketones, ur: NEGATIVE mg/dL
Leukocytes, UA: NEGATIVE
NITRITE: NEGATIVE
Protein, ur: NEGATIVE mg/dL
SPECIFIC GRAVITY, URINE: 1.014 (ref 1.005–1.030)
Squamous Epithelial / LPF: NONE SEEN (ref 0–5)
pH: 5 (ref 5.0–8.0)

## 2018-02-21 LAB — CBC
HEMATOCRIT: 40.2 % (ref 40.0–52.0)
Hemoglobin: 14.2 g/dL (ref 13.0–18.0)
MCH: 33.1 pg (ref 26.0–34.0)
MCHC: 35.4 g/dL (ref 32.0–36.0)
MCV: 93.6 fL (ref 80.0–100.0)
Platelets: 201 10*3/uL (ref 150–440)
RBC: 4.29 MIL/uL — AB (ref 4.40–5.90)
RDW: 13.2 % (ref 11.5–14.5)
WBC: 7 10*3/uL (ref 3.8–10.6)

## 2018-02-21 LAB — TROPONIN I: Troponin I: <0.03 ng/mL (ref <0.03)

## 2018-02-21 MED ORDER — SODIUM CHLORIDE 0.9 % IV BOLUS
1000.0000 mL | Freq: Once | INTRAVENOUS | Status: AC
  Administered 2018-02-21: 1000 mL via INTRAVENOUS

## 2018-02-21 MED ORDER — PATIROMER SORBITEX CALCIUM 8.4 G PO PACK
8.4000 g | PACK | Freq: Every day | ORAL | Status: DC
  Administered 2018-02-21: 8.4 g via ORAL
  Filled 2018-02-21: qty 1

## 2018-02-21 NOTE — ED Triage Notes (Signed)
Pt in via POV, received call from PCP today due to abnormal labs, reports low kidney function and advised to be evaluated in ED.  Pt ambulatory, vitals WDL, NAD noted at this time.

## 2018-02-21 NOTE — ED Notes (Signed)
Pt arrived from POV and states that he has pain in lower abdomen and groin area. Pt states that he had a few images recently and has a kidney stone as well as some problems with his mesh from hernia.

## 2018-02-21 NOTE — ED Triage Notes (Signed)
FIRST NURSE NOTE-sent for K 6.2, NA 128 per [facility].  Ambulatory. Want pt checked for AKI

## 2018-02-21 NOTE — ED Notes (Addendum)
Attempted straight stick IV x2 by this RN and 1x stick NT. MD notified. Lab Called.

## 2018-02-21 NOTE — ED Provider Notes (Signed)
[facility] Emergency Department Provider Note ____________________________________________   First MD Initiated Contact with Patient 02/21/18 1644     (approximate)  I have reviewed the triage vital signs and the nursing notes.   HISTORY  Chief Complaint Abnormal Labs  HPI Dustin Williamson is a 47 y.o. male with a history of diabetes as well as kidney stones and chronic lower abdominal as well as back pain over the past several months was presented to the emergency department today for an elevated potassium as well as BUN and creatinine.  He was sent over from his primary care doctor's office after he was found to have a potassium of 6.2 and a sodium of 128.  Patient says that the abdominal as well as back pain is unchanged and are to the lower abdomen.  He had a scan as well as lumbar x-rays in late August which showed right-sided nephrolithiasis as well as arthritis to his low back.   Past Medical History:  Diagnosis Date  . Anxiety   . Depressed   . Diabetes mellitus without complication ([place])   . Heartburn   . HLD (hyperlipidemia)   . Hypertension   . Kidney stone   . Migraines   . Sleep apnea     Patient Active Problem List   Diagnosis Date Noted  . Hyponatremia 11/15/2016  . OSA (obstructive sleep apnea) 08/12/2016  . HTN (hypertension) 08/12/2016  . Depression 08/12/2016  . Left ureteral stone   . Ureteral obstruction, left 05/22/2016  . Acute left flank pain   . AKI (acute kidney injury) ([place]) 05/04/2016  . Diabetes ([place]) 05/04/2016  . Hypotension 05/04/2016  . Urinary obstruction 05/04/2016  . Hyperglycemia 09/26/2015  . CLOSED FRACTURE OF BASE OF OTHER METACARPAL BONE 03/09/2009    Past Surgical History:  Procedure Laterality Date  . CYSTOSCOPY W/ URETERAL STENT REMOVAL N/A 05/27/2016   Procedure: CYSTOSCOPY WITH STENT REMOVAL;  Surgeon: Darcella Cheshire, MD;  Location: [facility] ORS;  Service: Urology;  Laterality: N/A;  . CYSTOSCOPY WITH  STENT PLACEMENT Left 05/05/2016   Procedure: CYSTOSCOPY WITH STENT PLACEMENT;  Surgeon: Hollice Espy, MD;  Location: [facility] ORS;  Service: Urology;  Laterality: Left;  . HERNIA REPAIR    . KNEE SURGERY Left 2009    Prior to Admission medications   Medication Sig Start Date End Date Taking? Authorizing Provider  albuterol (PROVENTIL HFA;VENTOLIN HFA) 108 (90 Base) MCG/ACT inhaler Inhale 2 puffs into the lungs every 4 (four) hours as needed for wheezing or shortness of breath.    [provider]  amitriptyline (ELAVIL) 25 MG tablet Take 50 mg by mouth at bedtime.    [provider]  aspirin 81 MG chewable tablet Chew 81 mg by mouth daily.    [provider]  atorvastatin (LIPITOR) 20 MG tablet Take 1 tablet (20 mg total) by mouth daily. 08/13/16   Dustin Flock, MD  cyclobenzaprine (FLEXERIL) 10 MG tablet Take 10 mg by mouth 3 (three) times daily as needed for muscle spasms.    [provider]  FLUoxetine (PROZAC) 20 MG capsule Take 20 mg by mouth at bedtime.    [provider]  gabapentin (NEURONTIN) 400 MG capsule Take 1,200 mg by mouth 2 (two) times daily.    [provider]  insulin aspart (NOVOLOG) 100 UNIT/ML injection Inject 0-20 Units into the skin 3 (three) times daily with meals. Patient not taking: Reported on 06/18/2017 11/16/16   Epifanio Lesches, MD  insulin aspart (  NOVOLOG) 100 UNIT/ML injection Inject 0-5 Units into the skin at bedtime. Patient not taking: Reported on 06/18/2017 11/16/16   Epifanio Lesches, MD  insulin aspart (NOVOLOG) 100 UNIT/ML injection Inject 10 Units into the skin 3 (three) times daily with meals.    [provider]  insulin glargine (LANTUS) 100 UNIT/ML injection Inject 0.24 mLs (24 Units total) into the skin at bedtime. Patient taking differently: Inject 65 Units into the skin 2 (two) times daily.  05/06/16   Loletha Grayer, MD  lisinopril (PRINIVIL,ZESTRIL) 10 MG tablet Take 1 tablet  (10 mg total) by mouth daily. 08/13/16   Dustin Flock, MD  metFORMIN (GLUCOPHAGE) 1000 MG tablet Take 1 tablet (1,000 mg total) by mouth 2 (two) times daily with a meal. 08/13/16   Dustin Flock, MD  naproxen (NAPROSYN) 500 MG tablet Take 500 mg by mouth 2 (two) times daily with a meal.    [provider]  omeprazole (PRILOSEC) 20 MG capsule Take 20 mg by mouth daily.    [provider]  oxyCODONE-acetaminophen (ROXICET) 5-325 MG tablet Take 1 tablet every 6 (six) hours as needed by mouth. Patient not taking: Reported on 06/18/2017 05/06/17 05/06/18  Nena Polio, MD  propranolol (INDERAL) 80 MG tablet Take 1 tablet (80 mg total) by mouth 2 (two) times daily. 08/13/16   Dustin Flock, MD    Allergies Patient has no known allergies.  Family History  Problem Relation Age of Onset  . Diabetes Mellitus II Father   . Cancer Father   . Multiple myeloma Mother   . Prostate cancer Neg Hx   . Kidney disease Neg Hx   . Bladder Cancer Neg Hx     Social History Social History   Tobacco Use  . Smoking status: Former Smoker    Packs/day: 0.50    Years: 10.00    Pack years: 5.00    Last attempt to quit: 06/19/2000    Years since quitting: 17.6  . Smokeless tobacco: Never Used  . Tobacco comment: Hasn't smoked in 15 yrs or so   Substance Use Topics  . Alcohol use: No    Comment: been 3-4 yrs since last drank   . Drug use: No    Comment: noted positive lab tox for cocaine, cannabinoid, and opiates    Review of Systems  Constitutional: No fever/chills Eyes: No visual changes. ENT: No sore throat. Cardiovascular: Denies chest pain. Respiratory: Denies shortness of breath. Gastrointestinal: No nausea, no vomiting.  No diarrhea.  No constipation. Genitourinary: Negative for dysuria. Musculoskeletal: As above Skin: Negative for rash. Neurological: Negative for headaches, focal weakness or numbness.   ____________________________________________   PHYSICAL  EXAM:  VITAL SIGNS: ED Triage Vitals  Enc Vitals Group     BP 02/21/18 1420 (!) 126/91     Pulse Rate 02/21/18 1420 99     Resp 02/21/18 1420 16     Temp 02/21/18 1420 98.3 F (36.8 C)     Temp Source 02/21/18 1420 Oral     SpO2 02/21/18 1420 97 %     Weight 02/21/18 1422 (!) 304 lb (137.9 kg)     Height 02/21/18 1422 5' 6"  (1.676 m)     Head Circumference --      Peak Flow --      Pain Score 02/21/18 1421 5     Pain Loc --      Pain Edu? --      Excl. in [place]? --     Constitutional:  Alert and oriented. Well appearing and in no acute distress. Eyes: Conjunctivae are normal.  Head: Atraumatic. Nose: No congestion/rhinnorhea. Mouth/Throat: Mucous membranes are moist.  Neck: No stridor.   Cardiovascular: Normal rate, regular rhythm. Grossly normal heart sounds.  Good peripheral circulation. Respiratory: Normal respiratory effort.  No retractions. Lungs CTAB. Gastrointestinal: Soft mild to mild lower abdominal tenderness to palpation which the patient is chronic and unchanged.. No distention.  Mild to moderate bilateral CVA tenderness to palpation. Musculoskeletal: No lower extremity tenderness nor edema.  No joint effusions. Neurologic:  Normal speech and language. No gross focal neurologic deficits are appreciated. Skin:  Skin is warm, dry and intact. No rash noted. Psychiatric: Mood and affect are normal. Speech and behavior are normal.  ____________________________________________   LABS (all labs ordered are listed, but only abnormal results are displayed)  Labs Reviewed  BASIC METABOLIC PANEL - Abnormal; Notable for the following components:      Result Value   Sodium 130 (*)    Potassium 6.1 (*)    Glucose, Bld 298 (*)    BUN 28 (*)    Creatinine, Ser 1.51 (*)    GFR calc non Af Amer 53 (*)    All other components within normal limits  CBC - Abnormal; Notable for the following components:   RBC 4.29 (*)    All other components within normal limits  BASIC  METABOLIC PANEL - Abnormal; Notable for the following components:   Sodium 133 (*)    Glucose, Bld 134 (*)    BUN 25 (*)    Creatinine, Ser 1.55 (*)    GFR calc non Af Amer 52 (*)    GFR calc Af Amer 60 (*)    All other components within normal limits  URINALYSIS, COMPLETE (UACMP) WITH MICROSCOPIC - Abnormal; Notable for the following components:   Color, Urine STRAW (*)    APPearance CLEAR (*)    Glucose, UA 50 (*)    All other components within normal limits  TROPONIN I  BRAIN NATRIURETIC PEPTIDE   ____________________________________________  EKG  ED ECG REPORT I, Doran Stabler, the attending physician, personally viewed and interpreted this ECG.   Date: 02/21/2018  EKG Time: 1442  Rate: 85  Rhythm: normal sinus rhythm  Axis: Normal  Intervals:none  ST&T Change: No ST segment elevation or depression.  No abnormal T wave inversion.  No T wave peaking.  ____________________________________________  RADIOLOGY   ____________________________________________   PROCEDURES  Procedure(s) performed:   Procedures  Critical Care performed:   ____________________________________________   INITIAL IMPRESSION / ASSESSMENT AND PLAN / ED COURSE  Pertinent labs & imaging results that were available during my care of the patient were reviewed by me and considered in my medical decision making (see chart for details).  DDX: Electrolyte abnormality, hyperkalemia, hyponatremia, kidney failure, kidney stone, UTI As part of my medical decision making, I reviewed the following data within the [facility] Notes from prior ED visits  ----------------------------------------- 7:48 PM on 02/21/2018 -----------------------------------------  Discussed the case Dr. Candiss Norse of nephrology.  He recommends discontinuing the patient's lisinopril.  Also recommends an additional dose of Veltassa and discharged home as long as the patient's potassium is below 5.8 after  fluids and initial dose of Veltassa.  ----------------------------------------- 10:14 PM on 02/21/2018 -----------------------------------------  Patient with repeat blood draw of potassium of 4.6 and sodium of 133.  Creatinine slightly increased.  Patient without further complaints.  Urinalysis reassuring and no acute finding on the  patient's renal ultrasound.  Patient will need to follow-up with renal as well as his primary care doctor.  Because there was a very dramatic decrease in the potassium I will hold on the second dose of Veltassa.  Patient understanding of the diagnosis as well as treatment and willing to comply.  Understands the importance of also stopping his lisinopril.  It was disposed of properly in the emergency department and he will stop taking it. ____________________________________________   FINAL CLINICAL IMPRESSION(S) / ED DIAGNOSES  Hyperkalemia.  NEW MEDICATIONS STARTED DURING THIS VISIT:  New Prescriptions   No medications on file     Note:  This document was prepared using Dragon voice recognition software and may include unintentional dictation errors.     Orbie Pyo, MD 02/21/18 2216

## 2018-06-10 ENCOUNTER — Other Ambulatory Visit: Payer: Self-pay

## 2018-06-10 ENCOUNTER — Inpatient Hospital Stay
Admission: EM | Admit: 2018-06-10 | Discharge: 2018-06-12 | DRG: 194 | Disposition: A | Discharge status: Home / Self Care | Payer: Self-pay | Attending: Internal Medicine | Admitting: Internal Medicine

## 2018-06-10 ENCOUNTER — Emergency Department: Payer: Self-pay

## 2018-06-10 ENCOUNTER — Encounter: Payer: Self-pay | Admitting: *Deleted

## 2018-06-10 DIAGNOSIS — M6282 Rhabdomyolysis: Secondary | ICD-10-CM | POA: Diagnosis present

## 2018-06-10 DIAGNOSIS — G43909 Migraine, unspecified, not intractable, without status migrainosus: Secondary | ICD-10-CM | POA: Diagnosis present

## 2018-06-10 DIAGNOSIS — G4733 Obstructive sleep apnea (adult) (pediatric): Secondary | ICD-10-CM | POA: Diagnosis present

## 2018-06-10 DIAGNOSIS — J40 Bronchitis, not specified as acute or chronic: Secondary | ICD-10-CM | POA: Diagnosis present

## 2018-06-10 DIAGNOSIS — Z794 Long term (current) use of insulin: Secondary | ICD-10-CM

## 2018-06-10 DIAGNOSIS — E785 Hyperlipidemia, unspecified: Secondary | ICD-10-CM | POA: Diagnosis present

## 2018-06-10 DIAGNOSIS — Z23 Encounter for immunization: Secondary | ICD-10-CM

## 2018-06-10 DIAGNOSIS — F329 Major depressive disorder, single episode, unspecified: Secondary | ICD-10-CM | POA: Diagnosis present

## 2018-06-10 DIAGNOSIS — E1122 Type 2 diabetes mellitus with diabetic chronic kidney disease: Secondary | ICD-10-CM | POA: Diagnosis present

## 2018-06-10 DIAGNOSIS — J101 Influenza due to other identified influenza virus with other respiratory manifestations: Principal | ICD-10-CM | POA: Diagnosis present

## 2018-06-10 DIAGNOSIS — Z833 Family history of diabetes mellitus: Secondary | ICD-10-CM

## 2018-06-10 DIAGNOSIS — E1165 Type 2 diabetes mellitus with hyperglycemia: Secondary | ICD-10-CM | POA: Diagnosis present

## 2018-06-10 DIAGNOSIS — Z807 Family history of other malignant neoplasms of lymphoid, hematopoietic and related tissues: Secondary | ICD-10-CM

## 2018-06-10 DIAGNOSIS — I5032 Chronic diastolic (congestive) heart failure: Secondary | ICD-10-CM | POA: Diagnosis present

## 2018-06-10 DIAGNOSIS — Z87891 Personal history of nicotine dependence: Secondary | ICD-10-CM

## 2018-06-10 DIAGNOSIS — Z7982 Long term (current) use of aspirin: Secondary | ICD-10-CM

## 2018-06-10 DIAGNOSIS — I130 Hypertensive heart and chronic kidney disease with heart failure and stage 1 through stage 4 chronic kidney disease, or unspecified chronic kidney disease: Secondary | ICD-10-CM | POA: Diagnosis present

## 2018-06-10 DIAGNOSIS — Z87442 Personal history of urinary calculi: Secondary | ICD-10-CM

## 2018-06-10 DIAGNOSIS — N183 Chronic kidney disease, stage 3 (moderate): Secondary | ICD-10-CM | POA: Diagnosis present

## 2018-06-10 HISTORY — DX: Heart failure, unspecified: I50.9

## 2018-06-10 HISTORY — DX: Unspecified diastolic (congestive) heart failure: I50.30

## 2018-06-10 LAB — CBC WITH DIFFERENTIAL/PLATELET
Abs Immature Granulocytes: 0.03 10*3/uL (ref 0.00–0.07)
BASOS ABS: 0 10*3/uL (ref 0.0–0.1)
BASOS PCT: 1 %
EOS ABS: 0.1 10*3/uL (ref 0.0–0.5)
EOS PCT: 1 %
HCT: 40.2 % (ref 39.0–52.0)
Hemoglobin: 13.5 g/dL (ref 13.0–17.0)
IMMATURE GRANULOCYTES: 1 %
Lymphocytes Relative: 21 %
Lymphs Abs: 1.4 10*3/uL (ref 0.7–4.0)
MCH: 31.1 pg (ref 26.0–34.0)
MCHC: 33.6 g/dL (ref 30.0–36.0)
MCV: 92.6 fL (ref 80.0–100.0)
Monocytes Absolute: 0.9 10*3/uL (ref 0.1–1.0)
Monocytes Relative: 14 %
NEUTROS PCT: 62 %
Neutro Abs: 4 10*3/uL (ref 1.7–7.7)
PLATELETS: 159 10*3/uL (ref 150–400)
RBC: 4.34 MIL/uL (ref 4.22–5.81)
RDW: 12.4 % (ref 11.5–15.5)
WBC: 6.4 10*3/uL (ref 4.0–10.5)
nRBC: 0 % (ref 0.0–0.2)

## 2018-06-10 LAB — COMPREHENSIVE METABOLIC PANEL
ALK PHOS: 61 U/L (ref 38–126)
ALT: 118 U/L — ABNORMAL HIGH (ref 0–44)
ANION GAP: 9 (ref 5–15)
AST: 168 U/L — ABNORMAL HIGH (ref 15–41)
Albumin: 3.9 g/dL (ref 3.5–5.0)
BILIRUBIN TOTAL: 0.6 mg/dL (ref 0.3–1.2)
BUN: 17 mg/dL (ref 6–20)
CALCIUM: 8.6 mg/dL — AB (ref 8.9–10.3)
CO2: 23 mmol/L (ref 22–32)
CREATININE: 1.61 mg/dL — AB (ref 0.61–1.24)
Chloride: 97 mmol/L — ABNORMAL LOW (ref 98–111)
GFR calc Af Amer: 58 mL/min — ABNORMAL LOW (ref >60)
GFR, EST NON AFRICAN AMERICAN: 50 mL/min — AB (ref >60)
Glucose, Bld: 145 mg/dL — ABNORMAL HIGH (ref 70–99)
Potassium: 4.2 mmol/L (ref 3.5–5.1)
Sodium: 129 mmol/L — ABNORMAL LOW (ref 135–145)
TOTAL PROTEIN: 7.7 g/dL (ref 6.5–8.1)

## 2018-06-10 LAB — CK: CK TOTAL: 2459 U/L — AB (ref 49–397)

## 2018-06-10 LAB — INFLUENZA PANEL BY PCR (TYPE A & B)
Influenza A By PCR: NEGATIVE
Influenza B By PCR: POSITIVE — AB

## 2018-06-10 LAB — GLUCOSE, CAPILLARY: Glucose-Capillary: 99 mg/dL (ref 70–99)

## 2018-06-10 MED ORDER — ACETAMINOPHEN 325 MG PO TABS
650.0000 mg | ORAL_TABLET | Freq: Once | ORAL | Status: AC
  Administered 2018-06-10: 650 mg via ORAL
  Filled 2018-06-10: qty 2

## 2018-06-10 MED ORDER — SODIUM CHLORIDE 0.9 % IV SOLN
Freq: Once | INTRAVENOUS | Status: AC
  Administered 2018-06-10: 19:00:00 via INTRAVENOUS

## 2018-06-10 MED ORDER — OSELTAMIVIR PHOSPHATE 75 MG PO CAPS
75.0000 mg | ORAL_CAPSULE | Freq: Once | ORAL | Status: AC
  Administered 2018-06-10: 75 mg via ORAL
  Filled 2018-06-10: qty 1

## 2018-06-10 MED ORDER — FLUOXETINE HCL 20 MG PO CAPS
60.0000 mg | ORAL_CAPSULE | Freq: Every day | ORAL | Status: DC
  Administered 2018-06-11 – 2018-06-12 (×2): 60 mg via ORAL
  Filled 2018-06-10 (×2): qty 3

## 2018-06-10 MED ORDER — ENOXAPARIN SODIUM 40 MG/0.4ML ~~LOC~~ SOLN
40.0000 mg | Freq: Two times a day (BID) | SUBCUTANEOUS | Status: DC
  Administered 2018-06-10 – 2018-06-12 (×4): 40 mg via SUBCUTANEOUS
  Filled 2018-06-10 (×4): qty 0.4

## 2018-06-10 MED ORDER — ACETAMINOPHEN 325 MG PO TABS
650.0000 mg | ORAL_TABLET | Freq: Four times a day (QID) | ORAL | Status: DC | PRN
  Administered 2018-06-10 – 2018-06-11 (×2): 650 mg via ORAL
  Filled 2018-06-10 (×2): qty 2

## 2018-06-10 MED ORDER — INSULIN ASPART 100 UNIT/ML ~~LOC~~ SOLN: 0.0000 [IU] | Freq: Every day | SUBCUTANEOUS | Status: DC

## 2018-06-10 MED ORDER — SPIRONOLACTONE 25 MG PO TABS
12.5000 mg | ORAL_TABLET | Freq: Every day | ORAL | Status: DC
  Administered 2018-06-11 – 2018-06-12 (×2): 12.5 mg via ORAL
  Filled 2018-06-10 (×2): qty 0.5
  Filled 2018-06-10: qty 1

## 2018-06-10 MED ORDER — GUAIFENESIN-CODEINE 100-10 MG/5ML PO SOLN
5.0000 mL | ORAL | Status: DC | PRN
  Administered 2018-06-10 – 2018-06-12 (×8): 5 mL via ORAL
  Filled 2018-06-10 (×8): qty 5

## 2018-06-10 MED ORDER — BUSPIRONE HCL 15 MG PO TABS
15.0000 mg | ORAL_TABLET | Freq: Three times a day (TID) | ORAL | Status: DC
  Administered 2018-06-10 – 2018-06-12 (×5): 15 mg via ORAL
  Filled 2018-06-10 (×7): qty 1

## 2018-06-10 MED ORDER — ASPIRIN 81 MG PO CHEW
81.0000 mg | CHEWABLE_TABLET | Freq: Every day | ORAL | Status: DC
  Administered 2018-06-11 – 2018-06-12 (×2): 81 mg via ORAL
  Filled 2018-06-10 (×2): qty 1

## 2018-06-10 MED ORDER — OSELTAMIVIR PHOSPHATE 75 MG PO CAPS
75.0000 mg | ORAL_CAPSULE | Freq: Two times a day (BID) | ORAL | Status: DC
  Administered 2018-06-11 – 2018-06-12 (×3): 75 mg via ORAL
  Filled 2018-06-10 (×4): qty 1

## 2018-06-10 MED ORDER — SODIUM CHLORIDE 0.9 % IV SOLN
INTRAVENOUS | Status: AC
  Administered 2018-06-10: 22:00:00 via INTRAVENOUS

## 2018-06-10 MED ORDER — ATORVASTATIN CALCIUM 20 MG PO TABS
80.0000 mg | ORAL_TABLET | Freq: Every day | ORAL | Status: DC
  Administered 2018-06-11: 80 mg via ORAL
  Filled 2018-06-10: qty 4

## 2018-06-10 MED ORDER — SODIUM CHLORIDE 0.9 % IV BOLUS
500.0000 mL | Freq: Once | INTRAVENOUS | Status: AC
  Administered 2018-06-10: 500 mL via INTRAVENOUS

## 2018-06-10 MED ORDER — PANTOPRAZOLE SODIUM 40 MG PO TBEC
40.0000 mg | DELAYED_RELEASE_TABLET | Freq: Every day | ORAL | Status: DC
  Administered 2018-06-11 – 2018-06-12 (×2): 40 mg via ORAL
  Filled 2018-06-10 (×2): qty 1

## 2018-06-10 MED ORDER — INSULIN GLARGINE 100 UNIT/ML ~~LOC~~ SOLN
40.0000 [IU] | Freq: Every day | SUBCUTANEOUS | Status: DC
  Administered 2018-06-10 – 2018-06-11 (×2): 40 [IU] via SUBCUTANEOUS
  Filled 2018-06-10 (×3): qty 0.4

## 2018-06-10 MED ORDER — ALBUTEROL SULFATE (2.5 MG/3ML) 0.083% IN NEBU
2.5000 mg | INHALATION_SOLUTION | Freq: Once | RESPIRATORY_TRACT | Status: AC
  Administered 2018-06-10: 2.5 mg via RESPIRATORY_TRACT
  Filled 2018-06-10: qty 3

## 2018-06-10 MED ORDER — CYCLOBENZAPRINE HCL 10 MG PO TABS
10.0000 mg | ORAL_TABLET | Freq: Three times a day (TID) | ORAL | Status: DC
  Administered 2018-06-10 – 2018-06-12 (×5): 10 mg via ORAL
  Filled 2018-06-10 (×5): qty 1

## 2018-06-10 MED ORDER — GABAPENTIN 300 MG PO CAPS
1200.0000 mg | ORAL_CAPSULE | Freq: Three times a day (TID) | ORAL | Status: DC
  Administered 2018-06-10 – 2018-06-12 (×5): 1200 mg via ORAL
  Filled 2018-06-10 (×5): qty 4

## 2018-06-10 MED ORDER — QUETIAPINE FUMARATE 25 MG PO TABS
25.0000 mg | ORAL_TABLET | Freq: Every day | ORAL | Status: DC
  Administered 2018-06-10 – 2018-06-11 (×2): 25 mg via ORAL
  Filled 2018-06-10 (×2): qty 1

## 2018-06-10 MED ORDER — POLYETHYLENE GLYCOL 3350 17 G PO PACK: 17.0000 g | PACK | Freq: Every day | ORAL | Status: DC | PRN

## 2018-06-10 MED ORDER — ALBUTEROL SULFATE (2.5 MG/3ML) 0.083% IN NEBU
2.5000 mg | INHALATION_SOLUTION | RESPIRATORY_TRACT | Status: DC | PRN
  Administered 2018-06-11: 22:00:00 2.5 mg via RESPIRATORY_TRACT
  Filled 2018-06-10 (×2): qty 3

## 2018-06-10 MED ORDER — INSULIN ASPART 100 UNIT/ML ~~LOC~~ SOLN
0.0000 [IU] | Freq: Three times a day (TID) | SUBCUTANEOUS | Status: DC
  Administered 2018-06-11: 2 [IU] via SUBCUTANEOUS
  Administered 2018-06-11: 13:00:00 3 [IU] via SUBCUTANEOUS
  Administered 2018-06-12: 5 [IU] via SUBCUTANEOUS
  Administered 2018-06-12: 3 [IU] via SUBCUTANEOUS
  Filled 2018-06-10 (×4): qty 1

## 2018-06-10 MED ORDER — ONDANSETRON HCL 4 MG PO TABS: 4.0000 mg | ORAL_TABLET | Freq: Four times a day (QID) | ORAL | Status: DC | PRN

## 2018-06-10 MED ORDER — PROPRANOLOL HCL 40 MG PO TABS
80.0000 mg | ORAL_TABLET | Freq: Two times a day (BID) | ORAL | Status: DC
  Administered 2018-06-10 – 2018-06-12 (×4): 80 mg via ORAL
  Filled 2018-06-10 (×5): qty 2

## 2018-06-10 MED ORDER — GUAIFENESIN 100 MG/5ML PO SOLN
400.0000 mg | ORAL | Status: DC | PRN
  Administered 2018-06-10: 400 mg via ORAL
  Filled 2018-06-10 (×3): qty 20

## 2018-06-10 MED ORDER — ACETAMINOPHEN 650 MG RE SUPP: 650.0000 mg | Freq: Four times a day (QID) | RECTAL | Status: DC | PRN

## 2018-06-10 MED ORDER — ONDANSETRON HCL 4 MG/2ML IJ SOLN: 4.0000 mg | Freq: Four times a day (QID) | INTRAMUSCULAR | Status: DC | PRN

## 2018-06-10 MED ORDER — LISINOPRIL 5 MG PO TABS
5.0000 mg | ORAL_TABLET | Freq: Every day | ORAL | Status: DC
  Administered 2018-06-11 – 2018-06-12 (×2): 5 mg via ORAL
  Filled 2018-06-10 (×2): qty 1

## 2018-06-10 NOTE — Progress Notes (Signed)
Lovenox changed to 40 mg BID for CrCl >30 and BMI >40. 

## 2018-06-10 NOTE — H&P (Signed)
[name] NAME: Dustin Williamson    MR#:  [id number]  DATE OF BIRTH:  02/22/1971  DATE OF ADMISSION:  06/10/2018  PRIMARY CARE PHYSICIAN: Donnie Coffin, MD   REQUESTING/REFERRING PHYSICIAN: Arta Silence, MD  CHIEF COMPLAINT:   Chief Complaint  Patient presents with  . Cough  . Fever  . Abdominal Pain    HISTORY OF PRESENT ILLNESS:  Dustin Williamson  is a 47 y.o. male with a known history of hypertension, type 2 diabetes, hyperlipidemia, chronic diastolic heart failure, OSA, depression who presented to the ED with fevers to 102.62F, dry cough, multiple episodes of vomiting, and muscle aches starting 3 days ago.  He feels like his symptoms have been getting progressively worse.  He states that his whole body hurts.  He denies any abdominal pain.  He states he has coughed so much that his ribs are hurting.  In the ED, he was tachycardic to the low 100s.  Labs are significant for CK 2459.  He tested positive for influenza B.  Chest x-ray negative.  He was given a bolus of fluids.  Hospitalists were called for admission.  PAST MEDICAL HISTORY:   Past Medical History:  Diagnosis Date  . Anxiety   . CHF (congestive heart failure) ([place])   . Depressed   . Diabetes mellitus without complication ([place])   . Diastolic heart failure ([place])   . Heartburn   . HLD (hyperlipidemia)   . Hypertension   . Kidney stone   . Migraines   . Sleep apnea     PAST SURGICAL HISTORY:   Past Surgical History:  Procedure Laterality Date  . CYSTOSCOPY W/ URETERAL STENT REMOVAL N/A 05/27/2016   Procedure: CYSTOSCOPY WITH STENT REMOVAL;  Surgeon: Darcella Cheshire, MD;  Location: [facility] ORS;  Service: Urology;  Laterality: N/A;  . CYSTOSCOPY WITH STENT PLACEMENT Left 05/05/2016   Procedure: CYSTOSCOPY WITH STENT PLACEMENT;  Surgeon: Hollice Espy, MD;  Location: [facility] ORS;  Service: Urology;  Laterality: Left;  . HERNIA REPAIR    . KNEE SURGERY Left 2009     SOCIAL HISTORY:   Social History   Tobacco Use  . Smoking status: Former Smoker    Packs/day: 0.50    Years: 10.00    Pack years: 5.00    Last attempt to quit: 06/19/2000    Years since quitting: 17.9  . Smokeless tobacco: Never Used  . Tobacco comment: Hasn't smoked in 15 yrs or so   Substance Use Topics  . Alcohol use: No    Comment: been 3-4 yrs since last drank     FAMILY HISTORY:   Family History  Problem Relation Age of Onset  . Diabetes Mellitus II Father   . Cancer Father   . Multiple myeloma Mother   . Prostate cancer Neg Hx   . Kidney disease Neg Hx   . Bladder Cancer Neg Hx     DRUG ALLERGIES:  No Known Allergies  REVIEW OF SYSTEMS:   Review of Systems  Constitutional: Positive for chills, fever and malaise/fatigue.  HENT: Negative for congestion and sore throat.   Eyes: Negative for blurred vision and double vision.  Respiratory: Positive for cough, shortness of breath and wheezing. Negative for sputum production.   Cardiovascular: Negative for chest pain, palpitations and leg swelling.  Gastrointestinal: Positive for nausea and vomiting. Negative for abdominal pain.  Genitourinary: Negative for dysuria and urgency.  Musculoskeletal: Positive for myalgias. Negative for  joint pain.  Neurological: Negative for dizziness and headaches.  Psychiatric/Behavioral: Negative for depression. The patient is not nervous/anxious.     MEDICATIONS AT HOME:   Prior to Admission medications   Medication Sig Start Date End Date Taking? Authorizing Provider  albuterol (PROVENTIL HFA;VENTOLIN HFA) 108 (90 Base) MCG/ACT inhaler Inhale 2 puffs into the lungs every 4 (four) hours as needed for wheezing or shortness of breath.   Yes [provider]  aspirin 81 MG chewable tablet Chew 81 mg by mouth daily.   Yes [provider]  atorvastatin (LIPITOR) 80 MG tablet Take 80 mg by mouth daily with supper.   Yes [provider]  busPIRone (BUSPAR)  15 MG tablet Take 15 mg by mouth 3 (three) times daily.   Yes [provider]  cyclobenzaprine (FLEXERIL) 10 MG tablet Take 10 mg by mouth 3 (three) times daily.    Yes [provider]  FLUoxetine (PROZAC) 20 MG capsule Take 60 mg by mouth daily.   Yes [provider]  furosemide (LASIX) 40 MG tablet Take 40 mg by mouth daily.   Yes [provider]  gabapentin (NEURONTIN) 400 MG capsule Take 1,200 mg by mouth 3 (three) times daily.    Yes [provider]  insulin aspart (NOVOLOG) 100 UNIT/ML injection Inject 0-20 Units into the skin 3 (three) times daily with meals. Patient taking differently: Inject 40 Units into the skin 3 (three) times daily with meals.  11/16/16  Yes Epifanio Lesches, MD  insulin glargine (LANTUS) 100 UNIT/ML injection Inject 0.24 mLs (24 Units total) into the skin at bedtime. Patient taking differently: Inject 80 Units into the skin 2 (two) times daily.  05/06/16  Yes Wieting, Richard, MD  lisinopril (PRINIVIL,ZESTRIL) 10 MG tablet Take 1 tablet (10 mg total) by mouth daily. Patient taking differently: Take 5 mg by mouth daily.  08/13/16  Yes Dustin Flock, MD  metFORMIN (GLUCOPHAGE) 1000 MG tablet Take 1 tablet (1,000 mg total) by mouth 2 (two) times daily with a meal. 08/13/16  Yes Dustin Flock, MD  omeprazole (PRILOSEC) 20 MG capsule Take 20 mg by mouth daily.   Yes [provider]  propranolol (INDERAL) 80 MG tablet Take 1 tablet (80 mg total) by mouth 2 (two) times daily. 08/13/16  Yes Dustin Flock, MD  QUEtiapine (SEROQUEL) 25 MG tablet Take 25 mg by mouth at bedtime.   Yes [provider]  spironolactone (ALDACTONE) 25 MG tablet Take 12.5 mg by mouth daily.   Yes [provider]      VITAL SIGNS:  Blood pressure (!) 147/96, pulse (!) 102, temperature 99.7 F (37.6 C), temperature source Oral, resp. rate 20, height 5' 7"  (1.702 m), weight 136.1 kg, SpO2 93 %.  PHYSICAL EXAMINATION:   Physical Exam  GENERAL:  47 y.o.-year-old patient lying in the bed with no acute distress.  Coughing. EYES: Pupils equal, round, reactive to light and accommodation. No scleral icterus. Extraocular muscles intact.  HEENT: Head atraumatic, normocephalic. Oropharynx and nasopharynx clear.  NECK:  Supple, no jugular venous distention. No thyroid enlargement, no tenderness.  LUNGS: Decreased air movement throughout all lung fields. no wheezing, rales,rhonchi or crepitation. No use of accessory muscles of respiration.  CARDIOVASCULAR: Tachycardic, regular rhythm, S1, S2 normal. No murmurs, rubs, or gallops.  ABDOMEN: Soft, nontender, nondistended. Bowel sounds present. No organomegaly or mass.  EXTREMITIES: No pedal edema, cyanosis, or clubbing.  NEUROLOGIC: Cranial nerves II through XII are intact. Muscle strength 5/5 in all extremities. Sensation  intact. Gait not checked.  PSYCHIATRIC: The patient is alert and oriented x 3.  SKIN: No obvious rash, lesion, or ulcer.   LABORATORY PANEL:   CBC Recent Labs  Lab 06/10/18 1503  WBC 6.4  HGB 13.5  HCT 40.2  PLT 159   ------------------------------------------------------------------------------------------------------------------  Chemistries  Recent Labs  Lab 06/10/18 1503  NA 129*  K 4.2  CL 97*  CO2 23  GLUCOSE 145*  BUN 17  CREATININE 1.61*  CALCIUM 8.6*  AST 168*  ALT 118*  ALKPHOS 61  BILITOT 0.6   ------------------------------------------------------------------------------------------------------------------  Cardiac Enzymes No results for input(s): TROPONINI in the last 168 hours. ------------------------------------------------------------------------------------------------------------------  RADIOLOGY:  Dg Chest 2 View  Result Date: 06/10/2018 CLINICAL DATA:  Shortness of breath EXAM: CHEST - 2 VIEW COMPARISON:  11/24/2008 FINDINGS: The heart size and mediastinal contours are within normal limits. Both lungs  are clear. The visualized skeletal structures are unremarkable. IMPRESSION: No active cardiopulmonary disease. Electronically Signed   By: Ulyses Jarred M.D.   On: 06/10/2018 15:35      IMPRESSION AND PLAN:   Influenza B- on room air.  -Continue Tamiflu -Gentle IV fluids -Tylenol and oxycodone for pain -Albuterol nebulizers as needed  Mild rhabdomyolysis- likely due to flu. CK 2,500. -Gentle IV fluids -Recheck CK in the morning  Chronic diastolic heart failure- stable, no signs of acute exacerbation. -Gentle IV fluids -Holding Lasix for now  Hypertension- normotensive in the ED -Continue home BP meds  Type 2 diabetes- hyperglycemic in the ED -Lantus 40 units qhs -Moderate SSI  CKD III- creatinine close to baseline. -Gentle IVFs -Recheck in the morning -Avoid nephrotoxic agents  Hyperlipidemia-stable -Continue home Lipitor  OSA- stable -CPAP qhs  Depression-stable -Continue home Buspar, Prozac, Seroquel  All the records are reviewed and case discussed with ED provider. Management plans discussed with the patient, family and they are in agreement.  CODE STATUS: Full  TOTAL TIME TAKING CARE OF THIS PATIENT: 45 minutes.    Berna Spare Neely Kammerer M.D on 06/10/2018 at 8:22 PM  Between 7am to 6pm - Pager [phone]  After 6pm go to [email or web address] - [employer] [place] [facility]  Office  [phone]  CC: Primary care physician; Donnie Coffin, MD   Note: This dictation was prepared with Dragon dictation along with smaller phrase technology. Any transcriptional errors that result from this process are unintentional.

## 2018-06-10 NOTE — ED Notes (Addendum)
Pt reports a cough and fever.  vape smoker.  Pt reports intermittent sob.  No chest pain.  Pt reports vomiting when coughing hard.   Pt alert  Speech clear.  Family with pt.

## 2018-06-10 NOTE — ED Notes (Signed)
FIRST NURSE NOTE:  Pt reports cold sxs for several days, c/o chills and subjective fevers. Mask applied on arrival.

## 2018-06-10 NOTE — ED Notes (Signed)
[facility]

## 2018-06-10 NOTE — ED Provider Notes (Signed)
[facility] Emergency Department Provider Note ____________________________________________   First MD Initiated Contact with Patient 06/10/18 1729     (approximate)  I have reviewed the triage vital signs and the nursing notes.   HISTORY  Chief Complaint Cough; Fever; and Abdominal Pain    HPI Dustin Williamson is a 47 y.o. male with PMH as noted below who presents with fever over the last 2 days, measured to 102 at home, associated with nonproductive cough, shortness of breath, as well as dry heaving and several episodes of vomiting.  He also reports body aches and malaise.  He denies associated chest pain, abdominal pain, diarrhea, or urinary symptoms.  He denies sick contacts or other recent illness.  He took ibuprofen today without much relief   Past Medical History:  Diagnosis Date  . Anxiety   . CHF (congestive heart failure) ([place])   . Depressed   . Diabetes mellitus without complication ([place])   . Diastolic heart failure ([place])   . Heartburn   . HLD (hyperlipidemia)   . Hypertension   . Kidney stone   . Migraines   . Sleep apnea     Patient Active Problem List   Diagnosis Date Noted  . Influenza B 06/10/2018  . Hyponatremia 11/15/2016  . OSA (obstructive sleep apnea) 08/12/2016  . HTN (hypertension) 08/12/2016  . Depression 08/12/2016  . Left ureteral stone   . Ureteral obstruction, left 05/22/2016  . Acute left flank pain   . AKI (acute kidney injury) ([place]) 05/04/2016  . Diabetes ([place]) 05/04/2016  . Hypotension 05/04/2016  . Urinary obstruction 05/04/2016  . Hyperglycemia 09/26/2015  . CLOSED FRACTURE OF BASE OF OTHER METACARPAL BONE 03/09/2009    Past Surgical History:  Procedure Laterality Date  . CYSTOSCOPY W/ URETERAL STENT REMOVAL N/A 05/27/2016   Procedure: CYSTOSCOPY WITH STENT REMOVAL;  Surgeon: Darcella Cheshire, MD;  Location: [facility] ORS;  Service: Urology;  Laterality: N/A;  . CYSTOSCOPY WITH STENT PLACEMENT Left 05/05/2016   Procedure: CYSTOSCOPY WITH STENT PLACEMENT;  Surgeon: Hollice Espy, MD;  Location: [facility] ORS;  Service: Urology;  Laterality: Left;  . HERNIA REPAIR    . KNEE SURGERY Left 2009    Prior to Admission medications   Medication Sig Start Date End Date Taking? Authorizing Provider  albuterol (PROVENTIL HFA;VENTOLIN HFA) 108 (90 Base) MCG/ACT inhaler Inhale 2 puffs into the lungs every 4 (four) hours as needed for wheezing or shortness of breath.   Yes [provider]  aspirin 81 MG chewable tablet Chew 81 mg by mouth daily.   Yes [provider]  atorvastatin (LIPITOR) 80 MG tablet Take 80 mg by mouth daily with supper.   Yes [provider]  busPIRone (BUSPAR) 15 MG tablet Take 15 mg by mouth 3 (three) times daily.   Yes [provider]  cyclobenzaprine (FLEXERIL) 10 MG tablet Take 10 mg by mouth 3 (three) times daily.    Yes [provider]  FLUoxetine (PROZAC) 20 MG capsule Take 60 mg by mouth daily.   Yes [provider]  furosemide (LASIX) 40 MG tablet Take 40 mg by mouth daily.   Yes [provider]  gabapentin (NEURONTIN) 400 MG capsule Take 1,200 mg by mouth 3 (three) times daily.    Yes [provider]  insulin aspart (NOVOLOG) 100 UNIT/ML injection Inject 0-20 Units into the skin 3 (three) times daily with meals. Patient taking differently: Inject 40 Units into the skin 3 (three) times daily with  meals.  11/16/16  Yes Epifanio Lesches, MD  insulin glargine (LANTUS) 100 UNIT/ML injection Inject 0.24 mLs (24 Units total) into the skin at bedtime. Patient taking differently: Inject 80 Units into the skin 2 (two) times daily.  05/06/16  Yes Wieting, Richard, MD  lisinopril (PRINIVIL,ZESTRIL) 10 MG tablet Take 1 tablet (10 mg total) by mouth daily. Patient taking differently: Take 5 mg by mouth daily.  08/13/16  Yes Dustin Flock, MD  metFORMIN (GLUCOPHAGE) 1000 MG tablet Take 1 tablet (1,000 mg total) by mouth 2 (two)  times daily with a meal. 08/13/16  Yes Dustin Flock, MD  omeprazole (PRILOSEC) 20 MG capsule Take 20 mg by mouth daily.   Yes [provider]  propranolol (INDERAL) 80 MG tablet Take 1 tablet (80 mg total) by mouth 2 (two) times daily. 08/13/16  Yes Dustin Flock, MD  QUEtiapine (SEROQUEL) 25 MG tablet Take 25 mg by mouth at bedtime.   Yes [provider]  spironolactone (ALDACTONE) 25 MG tablet Take 12.5 mg by mouth daily.   Yes [provider]    Allergies Patient has no known allergies.  Family History  Problem Relation Age of Onset  . Diabetes Mellitus II Father   . Cancer Father   . Multiple myeloma Mother   . Prostate cancer Neg Hx   . Kidney disease Neg Hx   . Bladder Cancer Neg Hx     Social History Social History   Tobacco Use  . Smoking status: Former Smoker    Packs/day: 0.50    Years: 10.00    Pack years: 5.00    Last attempt to quit: 06/19/2000    Years since quitting: 17.9  . Smokeless tobacco: Never Used  . Tobacco comment: Hasn't smoked in 15 yrs or so   Substance Use Topics  . Alcohol use: No    Comment: been 3-4 yrs since last drank   . Drug use: No    Comment: noted positive lab tox for cocaine, cannabinoid, and opiates    Review of Systems  Constitutional: Positive for fever. Eyes: No redness. ENT: Positive for congestion.   Cardiovascular: Denies chest pain. Respiratory: Positive for shortness of breath. Gastrointestinal: Positive for vomiting. Genitourinary: Positive for flank pain.  Musculoskeletal: Positive for myalgias Skin: Negative for rash. Neurological: Negative for headache.   ____________________________________________   PHYSICAL EXAM:  VITAL SIGNS: ED Triage Vitals  Enc Vitals Group     BP 06/10/18 1451 (!) 149/95     Pulse Rate 06/10/18 1451 (!) 114     Resp 06/10/18 1451 (!) 22     Temp 06/10/18 1451 (!) 102.7 F (39.3 C)     Temp Source 06/10/18 1451 Oral     SpO2 06/10/18 1451 92 %      Weight 06/10/18 1452 300 lb (136.1 kg)     Height 06/10/18 1452 5' 7"  (1.702 m)     Head Circumference --      Peak Flow --      Pain Score 06/10/18 1452 10     Pain Loc --      Pain Edu? --      Excl. in [place]? --     Constitutional: Alert and oriented.  Relatively well appearing and in no acute distress. Eyes: Conjunctivae are normal.  Head: Atraumatic. Nose: No congestion/rhinnorhea. Mouth/Throat: Mucous membranes are somewhat dry.   Neck: Normal range of motion.  Cardiovascular: Tachycardic, regular rhythm. Grossly normal heart sounds.  Good peripheral circulation. Respiratory: Normal respiratory  effort.  No retractions.  Scattered wheezes bilaterally. Gastrointestinal: Soft and nontender. No distention.  Genitourinary: No flank tenderness. Musculoskeletal: No lower extremity edema.  Extremities warm and well perfused.  Neurologic:  Normal speech and language. No gross focal neurologic deficits are appreciated.  Skin:  Skin is warm and dry. No rash noted. Psychiatric: Mood and affect are normal. Speech and behavior are normal.  ____________________________________________   LABS (all labs ordered are listed, but only abnormal results are displayed)  Labs Reviewed  COMPREHENSIVE METABOLIC PANEL - Abnormal; Notable for the following components:      Result Value   Sodium 129 (*)    Chloride 97 (*)    Glucose, Bld 145 (*)    Creatinine, Ser 1.61 (*)    Calcium 8.6 (*)    AST 168 (*)    ALT 118 (*)    GFR calc non Af Amer 50 (*)    GFR calc Af Amer 58 (*)    All other components within normal limits  CK - Abnormal; Notable for the following components:   Total CK 2,459 (*)    All other components within normal limits  INFLUENZA PANEL BY PCR (TYPE A & B) - Abnormal; Notable for the following components:   Influenza B By PCR POSITIVE (*)    All other components within normal limits  CBC WITH DIFFERENTIAL/PLATELET  GLUCOSE, CAPILLARY  HIV ANTIBODY (ROUTINE TESTING W  REFLEX)  BASIC METABOLIC PANEL  CBC  CK   ____________________________________________  EKG  ED ECG REPORT I, Arta Silence, the attending physician, personally viewed and interpreted this ECG.  Date: 06/10/2018 EKG Time: 1453 Rate: 114 Rhythm: normal sinus rhythm QRS Axis: normal Intervals: normal ST/T Wave abnormalities: normal Narrative Interpretation: no evidence of acute ischemia  ____________________________________________  RADIOLOGY  CXR: No focal infiltrate or acute edema  ____________________________________________   PROCEDURES  Procedure(s) performed: No  Procedures  Critical Care performed: No ____________________________________________   INITIAL IMPRESSION / ASSESSMENT AND PLAN / ED COURSE  Pertinent labs & imaging results that were available during my care of the patient were reviewed by me and considered in my medical decision making (see chart for details).  47 year old male with PMH as noted above presents with fever, cough and shortness of breath, myalgias, and malaise since yesterday with some vomiting as well.  On exam he is relatively well-appearing.  He is febrile on arrival and had mild tachycardia which has improved.  His other vital signs are normal.  His lungs demonstrate scattered wheezes the remainder of the exam is as described above.  Differential includes pneumonia, viral bronchitis, influenza, or less likely CHF.  The patient clinically does appear dehydrated.  We will obtain chest x-ray, labs, give a small fluid bolus, albuterol nebulizer, and reassess.  ----------------------------------------- 9:00 PM on 06/10/2018 -----------------------------------------  Chest x-ray showed no pneumonia.  The patient is positive for flu B.  He is feeling a bit better after fluids.  Because of the relatively intense myalgias I ordered a CK and it is elevated.  Given that the patient has some comorbidities and I do not want to hydrate  him excessively quickly given his CHF history, I think he would benefit from admission overnight for IV hydration over a longer period.  The patient agrees with this plan.  I signed the patient out to the hospitalist Dr. Brett Albino. ____________________________________________   FINAL CLINICAL IMPRESSION(S) / ED DIAGNOSES  Final diagnoses:  Influenza B  Non-traumatic rhabdomyolysis  Bronchitis  NEW MEDICATIONS STARTED DURING THIS VISIT:  Current Discharge Medication List       Note:  This document was prepared using Dragon voice recognition software and may include unintentional dictation errors.    Arta Silence, MD 06/10/18 2239

## 2018-06-10 NOTE — ED Triage Notes (Signed)
Pt to ED reporting generalized body aches that started last week with productive cough that has progressed into a dry cough with dry heaving and vomiting. Pt started running a fever yesterday of 102.8. Pt report shaving taken Ibuprofen today. SOB and increased WOB per pt but pt also reports the congestion has been giving him panic attacks when he can not catch his breath.

## 2018-06-11 DIAGNOSIS — M6282 Rhabdomyolysis: Secondary | ICD-10-CM | POA: Diagnosis present

## 2018-06-11 LAB — CBC
HCT: 39.3 % (ref 39.0–52.0)
Hemoglobin: 12.9 g/dL — ABNORMAL LOW (ref 13.0–17.0)
MCH: 31.3 pg (ref 26.0–34.0)
MCHC: 32.8 g/dL (ref 30.0–36.0)
MCV: 95.4 fL (ref 80.0–100.0)
NRBC: 0 % (ref 0.0–0.2)
Platelets: 132 10*3/uL — ABNORMAL LOW (ref 150–400)
RBC: 4.12 MIL/uL — AB (ref 4.22–5.81)
RDW: 12.6 % (ref 11.5–15.5)
WBC: 5.1 10*3/uL (ref 4.0–10.5)

## 2018-06-11 LAB — BASIC METABOLIC PANEL
ANION GAP: 7 (ref 5–15)
BUN: 18 mg/dL (ref 6–20)
CO2: 25 mmol/L (ref 22–32)
Calcium: 8.2 mg/dL — ABNORMAL LOW (ref 8.9–10.3)
Chloride: 101 mmol/L (ref 98–111)
Creatinine, Ser: 1.45 mg/dL — ABNORMAL HIGH (ref 0.61–1.24)
GFR calc Af Amer: >60 mL/min (ref >60)
GFR calc non Af Amer: 57 mL/min — ABNORMAL LOW (ref >60)
Glucose, Bld: 187 mg/dL — ABNORMAL HIGH (ref 70–99)
Potassium: 3.9 mmol/L (ref 3.5–5.1)
Sodium: 133 mmol/L — ABNORMAL LOW (ref 135–145)

## 2018-06-11 LAB — GLUCOSE, CAPILLARY
GLUCOSE-CAPILLARY: 165 mg/dL — AB (ref 70–99)
GLUCOSE-CAPILLARY: 130 mg/dL — AB (ref 70–99)
Glucose-Capillary: 120 mg/dL — ABNORMAL HIGH (ref 70–99)
Glucose-Capillary: 158 mg/dL — ABNORMAL HIGH (ref 70–99)

## 2018-06-11 LAB — CK: Total CK: 4137 U/L — ABNORMAL HIGH (ref 49–397)

## 2018-06-11 MED ORDER — OXYCODONE-ACETAMINOPHEN 5-325 MG PO TABS
1.0000 | ORAL_TABLET | ORAL | Status: DC | PRN
  Administered 2018-06-11 – 2018-06-12 (×3): 1 via ORAL
  Filled 2018-06-11 (×3): qty 1

## 2018-06-11 MED ORDER — SODIUM CHLORIDE 0.9 % IV SOLN
Freq: Once | INTRAVENOUS | Status: AC
  Administered 2018-06-11: 13:00:00 via INTRAVENOUS

## 2018-06-11 MED ORDER — INFLUENZA VAC SPLIT QUAD 0.5 ML IM SUSY
0.5000 mL | PREFILLED_SYRINGE | INTRAMUSCULAR | Status: AC
  Administered 2018-06-12: 0.5 mL via INTRAMUSCULAR
  Filled 2018-06-11: qty 0.5

## 2018-06-11 NOTE — Progress Notes (Signed)
[employer] [facility] - [place] [facility]   PATIENT NAME: Dustin Williamson    MR#:  [id number]  DATE OF BIRTH:  1970/10/25  SUBJECTIVE:  CHIEF COMPLAINT:   Chief Complaint  Patient presents with  . Cough  . Fever  . Abdominal Pain   Came with cough and chest and abdominal pain with fever and noted to have influenza and rhabdomyolysis with high CK level.  REVIEW OF SYSTEMS:  CONSTITUTIONAL: Have fever, fatigue or weakness.  EYES: No blurred or double vision.  EARS, NOSE, AND THROAT: No tinnitus or ear pain.  RESPIRATORY: Have cough, no shortness of breath, wheezing or hemoptysis.  CARDIOVASCULAR: No chest pain, orthopnea, edema.  GASTROINTESTINAL: No nausea, vomiting, diarrhea or abdominal pain.  GENITOURINARY: No dysuria, hematuria.  ENDOCRINE: No polyuria, nocturia,  HEMATOLOGY: No anemia, easy bruising or bleeding SKIN: No rash or lesion. MUSCULOSKELETAL: No joint pain or arthritis.   NEUROLOGIC: No tingling, numbness, weakness.  PSYCHIATRY: No anxiety or depression.   ROS  DRUG ALLERGIES:  No Known Allergies  VITALS:  Blood pressure 140/80, pulse 89, temperature 97.9 F (36.6 C), temperature source Oral, resp. rate 18, height 5\' 7"  (1.702 m), weight 136.1 kg, SpO2 95 %.  PHYSICAL EXAMINATION:  GENERAL:  47 y.o.-year-old patient lying in the bed with no acute distress.  EYES: Pupils equal, round, reactive to light and accommodation. No scleral icterus. Extraocular muscles intact.  HEENT: Head atraumatic, normocephalic. Oropharynx and nasopharynx clear.  NECK:  Supple, no jugular venous distention. No thyroid enlargement, no tenderness.  LUNGS: Normal breath sounds bilaterally, no wheezing, some crepitation. No use of accessory muscles of respiration.  CARDIOVASCULAR: S1, S2 normal. No murmurs, rubs, or gallops.  ABDOMEN: Soft, nontender, nondistended. Bowel sounds present. No organomegaly or mass.  EXTREMITIES: No pedal edema, cyanosis, or clubbing.   NEUROLOGIC: Cranial nerves II through XII are intact. Muscle strength 5/5 in all extremities. Sensation intact. Gait not checked.  PSYCHIATRIC: The patient is alert and oriented x 3.  SKIN: No obvious rash, lesion, or ulcer.   Physical Exam LABORATORY PANEL:   CBC Recent Labs  Lab 06/11/18 0334  WBC 5.1  HGB 12.9*  HCT 39.3  PLT 132*   ------------------------------------------------------------------------------------------------------------------  Chemistries  Recent Labs  Lab 06/10/18 1503 06/11/18 0334  NA 129* 133*  K 4.2 3.9  CL 97* 101  CO2 23 25  GLUCOSE 145* 187*  BUN 17 18  CREATININE 1.61* 1.45*  CALCIUM 8.6* 8.2*  AST 168*  --   ALT 118*  --   ALKPHOS 61  --   BILITOT 0.6  --    ------------------------------------------------------------------------------------------------------------------  Cardiac Enzymes No results for input(s): TROPONINI in the last 168 hours. ------------------------------------------------------------------------------------------------------------------  RADIOLOGY:  Dg Chest 2 View  Result Date: 06/10/2018 CLINICAL DATA:  Shortness of breath EXAM: CHEST - 2 VIEW COMPARISON:  11/24/2008 FINDINGS: The heart size and mediastinal contours are within normal limits. Both lungs are clear. The visualized skeletal structures are unremarkable. IMPRESSION: No active cardiopulmonary disease. Electronically Signed   By: Deatra RobinsonKevin  Herman M.D.   On: 06/10/2018 15:35    ASSESSMENT AND PLAN:   Active Problems:   Influenza B  Influenza B- on room air.  -Continue Tamiflu -Gentle IV fluids -Tylenol and oxycodone for pain - Albuterol nebulizers as needed  Mild rhabdomyolysis- likely due to flu. CK 2,500.- now > 4000. -Gentle IV fluids -Recheck CK in the morning. -Watch for fluid overload while giving IV fluid.  Chronic diastolic heart failure- stable, no  signs of acute exacerbation. -Gentle IV fluids -Holding Lasix for now-watch for  fluid overload.  Hypertension- normotensive in the ED -Continue home BP meds  Type 2 diabetes- hyperglycemic in the ED -Lantus 40 units qhs -Moderate SSI  CKD III- creatinine close to baseline. -Gentle IVFs -Recheck in the morning -Avoid nephrotoxic agents  Hyperlipidemia-stable -Continue home Lipitor  OSA- stable -CPAP qhs  Depression-stable -Continue home Buspar, Prozac, Seroquel    All the records are reviewed and case discussed with Care Management/Social Workerr. Management plans discussed with the patient, family and they are in agreement.  CODE STATUS: Full code.  TOTAL TIME TAKING CARE OF THIS PATIENT: 35 minutes.     POSSIBLE D/C IN 1-2 DAYS, DEPENDING ON CLINICAL CONDITION.   Altamese DillingVaibhavkumar Ravneet Spilker M.D on 06/11/2018   Between 7am to 6pm - Pager - [phone]  After 6pm go to [email or web address] - Social research officer, governmentpassword [email or web address] [email or web address]  [facility]  Office  [phone]  CC: Primary care physician; Emogene MorganAycock, Ngwe A, MD  Note: This dictation was prepared with Dragon dictation along with smaller phrase technology. Any transcriptional errors that result from this process are unintentional.

## 2018-06-11 NOTE — Plan of Care (Signed)
  Problem: Education: Goal: Knowledge of General Education information will improve Description: Including pain rating scale, medication(s)/side effects and non-pharmacologic comfort measures Outcome: Progressing   Problem: Clinical Measurements: Goal: Respiratory complications will improve Outcome: Progressing   Problem: Activity: Goal: Risk for activity intolerance will decrease Outcome: Progressing   Problem: Elimination: Goal: Will not experience complications related to urinary retention Outcome: Progressing   Problem: Pain Managment: Goal: General experience of comfort will improve Outcome: Progressing   Problem: Safety: Goal: Ability to remain free from injury will improve Outcome: Progressing   Problem: Skin Integrity: Goal: Risk for impaired skin integrity will decrease Outcome: Progressing   

## 2018-06-12 LAB — COMPREHENSIVE METABOLIC PANEL
ALT: 93 U/L — ABNORMAL HIGH (ref 0–44)
AST: 134 U/L — AB (ref 15–41)
Albumin: 3.5 g/dL (ref 3.5–5.0)
Alkaline Phosphatase: 51 U/L (ref 38–126)
Anion gap: 9 (ref 5–15)
BUN: 17 mg/dL (ref 6–20)
CO2: 26 mmol/L (ref 22–32)
Calcium: 8.3 mg/dL — ABNORMAL LOW (ref 8.9–10.3)
Chloride: 98 mmol/L (ref 98–111)
Creatinine, Ser: 1.49 mg/dL — ABNORMAL HIGH (ref 0.61–1.24)
GFR calc Af Amer: >60 mL/min (ref >60)
GFR calc non Af Amer: 55 mL/min — ABNORMAL LOW (ref >60)
GLUCOSE: 210 mg/dL — AB (ref 70–99)
Potassium: 4.1 mmol/L (ref 3.5–5.1)
Sodium: 133 mmol/L — ABNORMAL LOW (ref 135–145)
Total Bilirubin: 0.6 mg/dL (ref 0.3–1.2)
Total Protein: 6.9 g/dL (ref 6.5–8.1)

## 2018-06-12 LAB — GLUCOSE, CAPILLARY
Glucose-Capillary: 157 mg/dL — ABNORMAL HIGH (ref 70–99)
Glucose-Capillary: 230 mg/dL — ABNORMAL HIGH (ref 70–99)

## 2018-06-12 LAB — CBC
HCT: 40.6 % (ref 39.0–52.0)
HEMOGLOBIN: 13.2 g/dL (ref 13.0–17.0)
MCH: 31.3 pg (ref 26.0–34.0)
MCHC: 32.5 g/dL (ref 30.0–36.0)
MCV: 96.2 fL (ref 80.0–100.0)
NRBC: 0 % (ref 0.0–0.2)
Platelets: 144 10*3/uL — ABNORMAL LOW (ref 150–400)
RBC: 4.22 MIL/uL (ref 4.22–5.81)
RDW: 12.5 % (ref 11.5–15.5)
WBC: 5.4 10*3/uL (ref 4.0–10.5)

## 2018-06-12 LAB — HIV ANTIBODY (ROUTINE TESTING W REFLEX): HIV Screen 4th Generation wRfx: NONREACTIVE

## 2018-06-12 LAB — CK: Total CK: 2873 U/L — ABNORMAL HIGH (ref 49–397)

## 2018-06-12 MED ORDER — SODIUM CHLORIDE 0.9% FLUSH
3.0000 mL | Freq: Two times a day (BID) | INTRAVENOUS | Status: DC
  Administered 2018-06-12 (×2): 3 mL via INTRAVENOUS

## 2018-06-12 MED ORDER — ACETAMINOPHEN 325 MG PO TABS: 650.0000 mg | ORAL_TABLET | Freq: Four times a day (QID) | ORAL | 0 refills | Status: DC | PRN

## 2018-06-12 MED ORDER — GUAIFENESIN-CODEINE 100-10 MG/5ML PO SOLN: 5.0000 mL | ORAL | 0 refills | Status: DC | PRN

## 2018-06-12 MED ORDER — OSELTAMIVIR PHOSPHATE 75 MG PO CAPS: 75.0000 mg | ORAL_CAPSULE | Freq: Two times a day (BID) | ORAL | 0 refills | Status: AC

## 2018-06-12 NOTE — Care Management (Signed)
Message sent to UR RN for review of Observation status. No [place] 44 under Medicare guidelines as patient is listed a self-pay.

## 2018-06-12 NOTE — Progress Notes (Signed)
Reports feeling much better. HA and general chronic pain/achiness relieved by percocet and cough syrup. Reports ready to be discharged home. Mother and aunt with pt. Discharge home to self/home care with oral and written AVS instructions given with 2 printed rx's.

## 2018-06-25 NOTE — Discharge Summary (Signed)
[facility] [place] at [facility]   PATIENT NAME: Dustin DowningJeffrey Williamson    MR#:  [id number]  DATE OF BIRTH:  11/24/1970  DATE OF ADMISSION:  06/10/2018 ADMITTING PHYSICIAN: Campbell StallKaty Dodd Mayo, MD  DATE OF DISCHARGE: 06/12/2018  1:36 PM  PRIMARY CARE PHYSICIAN: Emogene MorganAycock, Ngwe A, MD    ADMISSION DIAGNOSIS:  Bronchitis [J40] Influenza B [J10.1] Non-traumatic rhabdomyolysis [M62.82]  DISCHARGE DIAGNOSIS:  Active Problems:   Influenza B   Rhabdomyolysis   SECONDARY DIAGNOSIS:   Past Medical History:  Diagnosis Date  . Anxiety   . CHF (congestive heart failure) (HCC)   . Depressed   . Diabetes mellitus without complication (HCC)   . Diastolic heart failure (HCC)   . Heartburn   . HLD (hyperlipidemia)   . Hypertension   . Kidney stone   . Migraines   . Sleep apnea     HOSPITAL COURSE:   Influenza B-on room air.  -Continue Tamiflu -Gentle IV fluids -Tylenol and oxycodone for pain - Albuterol nebulizers as needed  Mild rhabdomyolysis-likely due to flu. CK 2,500.- now > 4000. -Gentle IV fluids -Recheck CK in the morning. -Watch for fluid overload while giving IV fluid.  Chronic diastolic heart failure- stable, no signs of acute exacerbation. -GentleIV fluids -Holding Lasix for now-watch for fluid overload.  Hypertension- normotensive in the ED -Continue home BP meds  Type 2 diabetes- hyperglycemic in the ED -Lantus 40 units qhs -Moderate SSI  CKDIII- creatinine close to baseline. -Gentle IVFs -Recheck in the morning -Avoid nephrotoxic agents  Hyperlipidemia-stable -Continue home Lipitor  OSA- stable -CPAP qhs  Depression-stable -Continue home Buspar, Prozac, Seroquel   DISCHARGE CONDITIONS:   Stable.  CONSULTS OBTAINED:    DRUG ALLERGIES:  No Known Allergies  DISCHARGE MEDICATIONS:   Allergies as of 06/12/2018   No Known Allergies     Medication List    TAKE these medications   acetaminophen 325 MG  tablet Commonly known as:  TYLENOL Take 2 tablets (650 mg total) by mouth every 6 (six) hours as needed for mild pain (or Fever >/= 101).   albuterol 108 (90 Base) MCG/ACT inhaler Commonly known as:  PROVENTIL HFA;VENTOLIN HFA Inhale 2 puffs into the lungs every 4 (four) hours as needed for wheezing or shortness of breath.   aspirin 81 MG chewable tablet Chew 81 mg by mouth daily.   atorvastatin 80 MG tablet Commonly known as:  LIPITOR Take 80 mg by mouth daily with supper.   busPIRone 15 MG tablet Commonly known as:  BUSPAR Take 15 mg by mouth 3 (three) times daily.   cyclobenzaprine 10 MG tablet Commonly known as:  FLEXERIL Take 10 mg by mouth 3 (three) times daily.   FLUoxetine 20 MG capsule Commonly known as:  PROZAC Take 60 mg by mouth daily.   furosemide 40 MG tablet Commonly known as:  LASIX Take 40 mg by mouth daily.   gabapentin 400 MG capsule Commonly known as:  NEURONTIN Take 1,200 mg by mouth 3 (three) times daily.   guaiFENesin-codeine 100-10 MG/5ML syrup Take 5 mLs by mouth every 4 (four) hours as needed for cough.   insulin aspart 100 UNIT/ML injection Commonly known as:  novoLOG Inject 0-20 Units into the skin 3 (three) times daily with meals. What changed:  how much to take   insulin glargine 100 UNIT/ML injection Commonly known as:  LANTUS Inject 0.24 mLs (24 Units total) into the skin at bedtime. What changed:    how much to  take  when to take this   lisinopril 10 MG tablet Commonly known as:  PRINIVIL,ZESTRIL Take 1 tablet (10 mg total) by mouth daily. What changed:  how much to take   metFORMIN 1000 MG tablet Commonly known as:  GLUCOPHAGE Take 1 tablet (1,000 mg total) by mouth 2 (two) times daily with a meal.   omeprazole 20 MG capsule Commonly known as:  PRILOSEC Take 20 mg by mouth daily.   propranolol 80 MG tablet Commonly known as:  INDERAL Take 1 tablet (80 mg total) by mouth 2 (two) times daily.   QUEtiapine 25 MG  tablet Commonly known as:  SEROQUEL Take 25 mg by mouth at bedtime.   spironolactone 25 MG tablet Commonly known as:  ALDACTONE Take 12.5 mg by mouth daily.     ASK your doctor about these medications   oseltamivir 75 MG capsule Commonly known as:  TAMIFLU Take 1 capsule (75 mg total) by mouth 2 (two) times daily for 3 days. Ask about: Should I take this medication?        DISCHARGE INSTRUCTIONS:    Follow with PMD in 1-2 weeks.  If you experience worsening of your admission symptoms, develop shortness of breath, life threatening emergency, suicidal or homicidal thoughts you must seek medical attention immediately by calling 911 or calling your MD immediately  if symptoms less severe.  You Must read complete instructions/literature along with all the possible adverse reactions/side effects for all the Medicines you take and that have been prescribed to you. Take any new Medicines after you have completely understood and accept all the possible adverse reactions/side effects.   Please note  You were cared for by a hospitalist during your hospital stay. If you have any questions about your discharge medications or the care you received while you were in the hospital after you are discharged, you can call the unit and asked to speak with the hospitalist on call if the hospitalist that took care of you is not available. Once you are discharged, your primary care physician will handle any further medical issues. Please note that NO REFILLS for any discharge medications will be authorized once you are discharged, as it is imperative that you return to your primary care physician (or establish a relationship with a primary care physician if you do not have one) for your aftercare needs so that they can reassess your need for medications and monitor your lab values.    Today   CHIEF COMPLAINT:   Chief Complaint  Patient presents with  . Cough  . Fever  . Abdominal Pain    HISTORY  OF PRESENT ILLNESS:  Dustin Williamson  is a 48 y.o. male with a known history of hypertension, type 2 diabetes, hyperlipidemia, chronic diastolic heart failure, OSA, depression who presented to the ED with fevers to 102.36F, dry cough, multiple episodes of vomiting, and muscle aches starting 3 days ago.  He feels like his symptoms have been getting progressively worse.  He states that his whole body hurts.  He denies any abdominal pain.  He states he has coughed so much that his ribs are hurting.  In the ED, he was tachycardic to the low 100s.  Labs are significant for CK 2459.  He tested positive for influenza B.  Chest x-ray negative.  He was given a bolus of fluids.  Hospitalists were called for admission.   VITAL SIGNS:  Blood pressure (!) 119/92, pulse 83, temperature 97.8 F (36.6 C), temperature source Oral,  resp. rate 20, height 5\' 7"  (1.702 m), weight 136.1 kg, SpO2 92 %.  I/O:  No intake or output data in the 24 hours ending 06/25/18 2012  PHYSICAL EXAMINATION:  GENERAL:  48 y.o.-year-old patient lying in the bed with no acute distress.  EYES: Pupils equal, round, reactive to light and accommodation. No scleral icterus. Extraocular muscles intact.  HEENT: Head atraumatic, normocephalic. Oropharynx and nasopharynx clear.  NECK:  Supple, no jugular venous distention. No thyroid enlargement, no tenderness.  LUNGS: Normal breath sounds bilaterally, no wheezing, rales,rhonchi or crepitation. No use of accessory muscles of respiration.  CARDIOVASCULAR: S1, S2 normal. No murmurs, rubs, or gallops.  ABDOMEN: Soft, non-tender, non-distended. Bowel sounds present. No organomegaly or mass.  EXTREMITIES: No pedal edema, cyanosis, or clubbing.  NEUROLOGIC: Cranial nerves II through XII are intact. Muscle strength 5/5 in all extremities. Sensation intact. Gait not checked.  PSYCHIATRIC: The patient is alert and oriented x 3.  SKIN: No obvious rash, lesion, or ulcer.   DATA REVIEW:   CBC No  results for input(s): WBC, HGB, HCT, PLT in the last 168 hours.  Chemistries  No results for input(s): NA, K, CL, CO2, GLUCOSE, BUN, CREATININE, CALCIUM, MG, AST, ALT, ALKPHOS, BILITOT in the last 168 hours.  Invalid input(s): GFRCGP  Cardiac Enzymes No results for input(s): TROPONINI in the last 168 hours.  Microbiology Results  Results for orders placed or performed during the hospital encounter of 05/05/17  Urine culture     Status: None   Collection Time: 05/06/17  2:16 AM  Result Value Ref Range Status   Specimen Description URINE, CLEAN CATCH  Final   Special Requests NONE  Final   Culture   Final    NO GROWTH Performed at [facility], [address] [place], [place] [address]    Report Status 05/08/2017 FINAL  Final    RADIOLOGY:  No results found.  EKG:   Orders placed or performed during the hospital encounter of 06/10/18  . ED EKG  . ED EKG  . EKG 12-Lead  . EKG 12-Lead  . EKG    Management plans discussed with the patient, family and they are in agreement.  CODE STATUS:  Code Status History    Date Active Date Inactive Code Status Order ID Comments User Context   06/10/2018 2205 06/12/2018 1641 Full Code [id number]  Campbell Stall, MD Inpatient   11/15/2016 0432 11/16/2016 1504 Full Code [id number]  Ihor Austin, MD Inpatient   08/12/2016 2317 08/13/2016 1451 Full Code [id number]  Oralia Manis, MD ED   05/22/2016 0001 05/27/2016 2329 Full Code [id number]  Marin Olp, MD ED   05/04/2016 2359 05/06/2016 2019 Full Code [id number]  Oralia Manis, MD Inpatient   09/26/2015 1031 09/28/2015 2148 Full Code [id number]  Arnaldo Natal, MD Inpatient      TOTAL TIME TAKING CARE OF THIS PATIENT: 35 minutes.    Altamese Dilling M.D on 06/25/2018 at 8:12 PM  Between 7am to 6pm - Pager - [phone]  After 6pm go to [email or web address] - Social research officer, government  [facility]  Office  [phone]  CC: Primary care physician; Emogene Morgan,  MD   Note: This dictation was prepared with Dragon dictation along with smaller phrase technology. Any transcriptional errors that result from this process are unintentional.

## 2018-07-22 ENCOUNTER — Emergency Department: Payer: Self-pay

## 2018-07-22 ENCOUNTER — Emergency Department
Admission: EM | Admit: 2018-07-22 | Discharge: 2018-07-22 | Disposition: A | Discharge status: Home / Self Care | Payer: Self-pay | Attending: Emergency Medicine | Admitting: Emergency Medicine

## 2018-07-22 ENCOUNTER — Other Ambulatory Visit: Payer: Self-pay

## 2018-07-22 ENCOUNTER — Encounter: Payer: Self-pay | Admitting: *Deleted

## 2018-07-22 DIAGNOSIS — Z79899 Other long term (current) drug therapy: Secondary | ICD-10-CM | POA: Insufficient documentation

## 2018-07-22 DIAGNOSIS — R519 Headache, unspecified: Secondary | ICD-10-CM

## 2018-07-22 DIAGNOSIS — Z87891 Personal history of nicotine dependence: Secondary | ICD-10-CM | POA: Insufficient documentation

## 2018-07-22 DIAGNOSIS — Z7982 Long term (current) use of aspirin: Secondary | ICD-10-CM | POA: Insufficient documentation

## 2018-07-22 DIAGNOSIS — Z794 Long term (current) use of insulin: Secondary | ICD-10-CM | POA: Insufficient documentation

## 2018-07-22 DIAGNOSIS — I5030 Unspecified diastolic (congestive) heart failure: Secondary | ICD-10-CM | POA: Insufficient documentation

## 2018-07-22 DIAGNOSIS — I110 Hypertensive heart disease with heart failure: Secondary | ICD-10-CM | POA: Insufficient documentation

## 2018-07-22 DIAGNOSIS — R0602 Shortness of breath: Secondary | ICD-10-CM | POA: Insufficient documentation

## 2018-07-22 DIAGNOSIS — E119 Type 2 diabetes mellitus without complications: Secondary | ICD-10-CM | POA: Insufficient documentation

## 2018-07-22 DIAGNOSIS — M542 Cervicalgia: Secondary | ICD-10-CM

## 2018-07-22 DIAGNOSIS — R51 Headache: Secondary | ICD-10-CM | POA: Insufficient documentation

## 2018-07-22 DIAGNOSIS — R079 Chest pain, unspecified: Secondary | ICD-10-CM | POA: Insufficient documentation

## 2018-07-22 LAB — CBC
HCT: 40.4 % (ref 39.0–52.0)
Hemoglobin: 13.7 g/dL (ref 13.0–17.0)
MCH: 30.9 pg (ref 26.0–34.0)
MCHC: 33.9 g/dL (ref 30.0–36.0)
MCV: 91 fL (ref 80.0–100.0)
NRBC: 0 % (ref 0.0–0.2)
PLATELETS: 219 10*3/uL (ref 150–400)
RBC: 4.44 MIL/uL (ref 4.22–5.81)
RDW: 12.7 % (ref 11.5–15.5)
WBC: 7.7 10*3/uL (ref 4.0–10.5)

## 2018-07-22 LAB — BASIC METABOLIC PANEL
Anion gap: 9 (ref 5–15)
BUN: 17 mg/dL (ref 6–20)
CO2: 26 mmol/L (ref 22–32)
Calcium: 9 mg/dL (ref 8.9–10.3)
Chloride: 99 mmol/L (ref 98–111)
Creatinine, Ser: 1.24 mg/dL (ref 0.61–1.24)
GFR calc Af Amer: >60 mL/min (ref >60)
GFR calc non Af Amer: >60 mL/min (ref >60)
Glucose, Bld: 254 mg/dL — ABNORMAL HIGH (ref 70–99)
Potassium: 4.1 mmol/L (ref 3.5–5.1)
SODIUM: 134 mmol/L — AB (ref 135–145)

## 2018-07-22 LAB — TROPONIN I

## 2018-07-22 MED ORDER — KETOROLAC TROMETHAMINE 30 MG/ML IJ SOLN
30.0000 mg | Freq: Once | INTRAMUSCULAR | Status: AC
  Administered 2018-07-22: 30 mg via INTRAMUSCULAR
  Filled 2018-07-22: qty 1

## 2018-07-22 MED ORDER — DICLOFENAC SODIUM 1 % TD GEL: TRANSDERMAL | 0 refills | Status: DC

## 2018-07-22 NOTE — ED Notes (Signed)
Report given to Cole RN 

## 2018-07-22 NOTE — ED Notes (Signed)
Esign not working at this time. Pt verbalized discharge instructions and has no questions at this time. 

## 2018-07-22 NOTE — ED Provider Notes (Signed)
[facility] Emergency Department Provider Note  ____________________________________________   First MD Initiated Contact with Patient 07/22/18 1731     (approximate)  I have reviewed the triage vital signs and the nursing notes.   HISTORY  Chief Complaint Chest Pain   HPI Dustin Williamson is a 48 y.o. male with a history of CHF, anxiety, diastolic heart failure as well as hypertension was presenting the emergency department with left-sided neck pain radiating through to his chest.  He says the pain started this morning and he thinks he may have slept awkwardly as he says when he woke up all the sheets were ripped off the bed.  He says that he has a history of sleep apnea and sometimes sleeps restlessly.  Denies any heavy lifting or injury otherwise lately.  Says the pain does worsen with movement and exertion and is associated with shortness of breath.     Past Medical History:  Diagnosis Date  . Anxiety   . CHF (congestive heart failure) ([place])   . Depressed   . Diabetes mellitus without complication ([place])   . Diastolic heart failure ([place])   . Heartburn   . HLD (hyperlipidemia)   . Hypertension   . Kidney stone   . Migraines   . Sleep apnea     Patient Active Problem List   Diagnosis Date Noted  . Rhabdomyolysis 06/11/2018  . Influenza B 06/10/2018  . Hyponatremia 11/15/2016  . OSA (obstructive sleep apnea) 08/12/2016  . HTN (hypertension) 08/12/2016  . Depression 08/12/2016  . Left ureteral stone   . Ureteral obstruction, left 05/22/2016  . Acute left flank pain   . AKI (acute kidney injury) ([place]) 05/04/2016  . Diabetes ([place]) 05/04/2016  . Hypotension 05/04/2016  . Urinary obstruction 05/04/2016  . Hyperglycemia 09/26/2015  . CLOSED FRACTURE OF BASE OF OTHER METACARPAL BONE 03/09/2009    Past Surgical History:  Procedure Laterality Date  . CYSTOSCOPY W/ URETERAL STENT REMOVAL N/A 05/27/2016   Procedure: CYSTOSCOPY WITH STENT REMOVAL;   Surgeon: Darcella Cheshire, MD;  Location: [facility] ORS;  Service: Urology;  Laterality: N/A;  . CYSTOSCOPY WITH STENT PLACEMENT Left 05/05/2016   Procedure: CYSTOSCOPY WITH STENT PLACEMENT;  Surgeon: Hollice Espy, MD;  Location: [facility] ORS;  Service: Urology;  Laterality: Left;  . HERNIA REPAIR    . KNEE SURGERY Left 2009    Prior to Admission medications   Medication Sig Start Date End Date Taking? Authorizing Provider  acetaminophen (TYLENOL) 325 MG tablet Take 2 tablets (650 mg total) by mouth every 6 (six) hours as needed for mild pain (or Fever >/= 101). 06/12/18   Vaughan Basta, MD  albuterol (PROVENTIL HFA;VENTOLIN HFA) 108 (90 Base) MCG/ACT inhaler Inhale 2 puffs into the lungs every 4 (four) hours as needed for wheezing or shortness of breath.    [provider]  aspirin 81 MG chewable tablet Chew 81 mg by mouth daily.    [provider]  atorvastatin (LIPITOR) 80 MG tablet Take 80 mg by mouth daily with supper.    [provider]  busPIRone (BUSPAR) 15 MG tablet Take 15 mg by mouth 3 (three) times daily.    [provider]  cyclobenzaprine (FLEXERIL) 10 MG tablet Take 10 mg by mouth 3 (three) times daily.     [provider]  FLUoxetine (PROZAC) 20 MG capsule Take 60 mg by mouth daily.    [provider]  furosemide (LASIX) 40 MG tablet Take 40 mg by  mouth daily.    [provider]  gabapentin (NEURONTIN) 400 MG capsule Take 1,200 mg by mouth 3 (three) times daily.     [provider]  guaiFENesin-codeine 100-10 MG/5ML syrup Take 5 mLs by mouth every 4 (four) hours as needed for cough. 06/12/18   Vaughan Basta, MD  insulin aspart (NOVOLOG) 100 UNIT/ML injection Inject 0-20 Units into the skin 3 (three) times daily with meals. Patient taking differently: Inject 40 Units into the skin 3 (three) times daily with meals.  11/16/16   Epifanio Lesches, MD  insulin glargine (LANTUS) 100 UNIT/ML injection Inject  0.24 mLs (24 Units total) into the skin at bedtime. Patient taking differently: Inject 80 Units into the skin 2 (two) times daily.  05/06/16   Loletha Grayer, MD  lisinopril (PRINIVIL,ZESTRIL) 10 MG tablet Take 1 tablet (10 mg total) by mouth daily. Patient taking differently: Take 5 mg by mouth daily.  08/13/16   Dustin Flock, MD  metFORMIN (GLUCOPHAGE) 1000 MG tablet Take 1 tablet (1,000 mg total) by mouth 2 (two) times daily with a meal. 08/13/16   Dustin Flock, MD  omeprazole (PRILOSEC) 20 MG capsule Take 20 mg by mouth daily.    [provider]  propranolol (INDERAL) 80 MG tablet Take 1 tablet (80 mg total) by mouth 2 (two) times daily. 08/13/16   Dustin Flock, MD  QUEtiapine (SEROQUEL) 25 MG tablet Take 25 mg by mouth at bedtime.    [provider]  spironolactone (ALDACTONE) 25 MG tablet Take 12.5 mg by mouth daily.    [provider]    Allergies Patient has no known allergies.  Family History  Problem Relation Age of Onset  . Diabetes Mellitus II Father   . Cancer Father   . Multiple myeloma Mother   . Prostate cancer Neg Hx   . Kidney disease Neg Hx   . Bladder Cancer Neg Hx     Social History Social History   Tobacco Use  . Smoking status: Former Smoker    Packs/day: 0.50    Years: 10.00    Pack years: 5.00    Last attempt to quit: 06/19/2000    Years since quitting: 18.1  . Smokeless tobacco: Never Used  . Tobacco comment: Hasn't smoked in 15 yrs or so   Substance Use Topics  . Alcohol use: No    Comment: been 3-4 yrs since last drank   . Drug use: No    Comment: noted positive lab tox for cocaine, cannabinoid, and opiates    Review of Systems  Constitutional: No fever/chills Eyes: No visual changes. ENT: No sore throat. Cardiovascular: As above Respiratory: As above Gastrointestinal: No abdominal pain.  No nausea, no vomiting.  No diarrhea.  No constipation. Genitourinary: Negative for dysuria. Musculoskeletal: Negative  for back pain. Skin: Negative for rash. Neurological: Negative for  focal weakness or numbness.  Patient complaining of also left frontal headache which she describes as being connected to the left-sided neck pain.  Says that he has a history of "migraines" which she says are similar to this.  Requesting Toradol.   ____________________________________________   PHYSICAL EXAM:  VITAL SIGNS: ED Triage Vitals  Enc Vitals Group     BP 07/22/18 1611 (!) 136/99     Pulse Rate 07/22/18 1611 88     Resp 07/22/18 1611 20     Temp 07/22/18 1611 98.1 F (36.7 C)     Temp Source 07/22/18 1611 Oral     SpO2 07/22/18  1611 96 %     Weight 07/22/18 1610 300 lb (136.1 kg)     Height 07/22/18 1610 5' 7"  (1.702 m)     Head Circumference --      Peak Flow --      Pain Score 07/22/18 1609 8     Pain Loc --      Pain Edu? --      Excl. in [place]? --     Constitutional: Alert and oriented. Well appearing and in no acute distress. Eyes: Conjunctivae are normal.  Head: Atraumatic. Nose: No congestion/rhinnorhea. Mouth/Throat: Mucous membranes are moist.  Neck: No stridor.  Tenderness palpation along the left trapezius muscle reproducing the pain described by the patient. Cardiovascular: Normal rate, regular rhythm. Grossly normal heart sounds.   Respiratory: Normal respiratory effort.  No retractions. Lungs CTAB. Gastrointestinal: Soft and nontender. No distention.  Musculoskeletal: No lower extremity tenderness nor edema.  No joint effusions. Neurologic:  Normal speech and language. No gross focal neurologic deficits are appreciated. Skin:  Skin is warm, dry and intact. No rash noted. Psychiatric: Mood and affect are normal. Speech and behavior are normal.  ____________________________________________   LABS (all labs ordered are listed, but only abnormal results are displayed)  Labs Reviewed  BASIC METABOLIC PANEL - Abnormal; Notable for the following components:      Result Value   Sodium  134 (*)    Glucose, Bld 254 (*)    All other components within normal limits  CBC  TROPONIN I   ____________________________________________  EKG  ED ECG REPORT I, Doran Stabler, the attending physician, personally viewed and interpreted this ECG.   Date: 07/22/2018  EKG Time: 1610  Rate: 85  Rhythm: normal sinus rhythm  Axis: Normal  Intervals:none  ST&T Change: No ST segment elevation or depression.  No abnormal T wave inversion.  ____________________________________________  RADIOLOGY  Chest x-ray with low lung volumes.  No acute cardiopulmonary abnormality ____________________________________________   PROCEDURES  Procedure(s) performed:   Procedures  Critical Care performed:   ____________________________________________   INITIAL IMPRESSION / ASSESSMENT AND PLAN / ED COURSE  Pertinent labs & imaging results that were available during my care of the patient were reviewed by me and considered in my medical decision making (see chart for details).  Differential diagnosis includes, but is not limited to, ACS, aortic dissection, pulmonary embolism, cardiac tamponade, pneumothorax, pneumonia, pericarditis, myocarditis, GI-related causes including esophagitis/gastritis, and musculoskeletal chest wall pain.   As part of my medical decision making, I reviewed the following data within the electronic MEDICAL RECORD NUMBER Notes from prior ED visits  Patient with reproducible pain to the left trapezius muscle.  Reassuring lab work as well as EKG.  Patient to be discharged with diclofenac gel.  We will follow-up with cardiology [facility].  Feel the pain is likely musculoskeletal radiating from the left side of the neck and to the chest. ____________________________________________   FINAL CLINICAL IMPRESSION(S) / ED DIAGNOSES  Left-sided neck pain.  Headache.  Chest pain.  NEW MEDICATIONS STARTED DURING THIS VISIT:  New Prescriptions   No medications on file      Note:  This document was prepared using Dragon voice recognition software and may include unintentional dictation errors.     Orbie Pyo, MD 07/22/18 [phone]

## 2018-07-22 NOTE — ED Triage Notes (Signed)
First Nurse Note:  Arrives with C/O CP.  Onset this am.  Pain initiates in right chest then radiates toward left.  Patient states pain in intermittent.

## 2018-07-22 NOTE — ED Triage Notes (Signed)
Pt ambulatory to triage.  Pt reports chest pian since this am.  Pt reports pain across the chest  And sob.  No cough.  Non smoker.  Pt alert  Speech clear.

## 2018-09-18 ENCOUNTER — Telehealth: Payer: Self-pay | Admitting: Pharmacy Technician

## 2018-09-18 NOTE — Telephone Encounter (Signed)
Received 2020 proof of income.  Patient eligible to receive medication assistance at [facility] as long as eligibility requirements continue to be met.  [facility]

## 2019-02-11 ENCOUNTER — Encounter: Payer: Self-pay | Admitting: Pharmacist

## 2019-02-11 ENCOUNTER — Ambulatory Visit: Payer: Self-pay | Admitting: Pharmacist

## 2019-02-11 NOTE — Progress Notes (Cosign Needed)
[facility] Visit Note  Patient: Dustin Williamson MRN: [id number] Date of Birth: 06/03/1971 PCP: Donnie Coffin, MD   Dustin Williamson 48 y.o. male was contacted via telephone for MTM. Patient was identified with DOB and address.  There were no vitals taken for this visit.  Patient Information   Past Medical History:  Diagnosis Date  . Anxiety   . CHF (congestive heart failure) ([place])   . Depressed   . Diabetes mellitus without complication ([place])   . Diastolic heart failure ([place])   . Heartburn   . HLD (hyperlipidemia)   . Hypertension   . Kidney stone   . Migraines   . Sleep apnea       Past Surgical History:  Procedure Laterality Date  . CYSTOSCOPY W/ URETERAL STENT REMOVAL N/A 05/27/2016   Procedure: CYSTOSCOPY WITH STENT REMOVAL;  Surgeon: Darcella Cheshire, MD;  Location: [facility] ORS;  Service: Urology;  Laterality: N/A;  . CYSTOSCOPY WITH STENT PLACEMENT Left 05/05/2016   Procedure: CYSTOSCOPY WITH STENT PLACEMENT;  Surgeon: Hollice Espy, MD;  Location: [facility] ORS;  Service: Urology;  Laterality: Left;  . HERNIA REPAIR    . KNEE SURGERY Left 2009     Family History  Problem Relation Age of Onset  . Diabetes Mellitus II Father   . Cancer Father   . Multiple myeloma Father   . Hypertension Mother   . Dementia Paternal Aunt   . Prostate cancer Neg Hx   . Kidney disease Neg Hx   . Bladder Cancer Neg Hx     New Diagnoses (since last visit):   Family Support: Comments:Lives with mother but no one to talk to other than therapist  Lifestyle Diet: Breakfast: Steak and eggs or fried chicken and eggs.  Lunch: skips Dinner: meat, vegetables, carbs. Drinks: Mostly Dr. Malachi Bonds, some water.   Exercise: patient walks occasionally, but not consistently due to leg pain.           Social History   Substance and Sexual Activity  Alcohol Use No  . Frequency: Never   Comment: been 3-4 yrs since last drank       Social History   Tobacco Use  Smoking  Status Former Smoker  . Packs/day: 0.50  . Years: 10.00  . Pack years: 5.00  . Quit date: 06/19/2000  . Years since quitting: 18.6  Smokeless Tobacco Never Used  Tobacco Comment   Hasn't smoked in 15 yrs or so       Health Maintenance  Topic Date Due  . PNEUMOCOCCAL POLYSACCHARIDE VACCINE AGE 70-64 HIGH RISK  07/10/1972  . FOOT EXAM  07/10/1980  . OPHTHALMOLOGY EXAM  07/10/1980  . TETANUS/TDAP  07/10/1989  . HEMOGLOBIN A1C  05/18/2017  . INFLUENZA VACCINE  01/18/2019  . HIV Screening  Completed   Outpatient Encounter Medications as of 02/11/2019  Medication Sig  . albuterol (PROVENTIL HFA;VENTOLIN HFA) 108 (90 Base) MCG/ACT inhaler Inhale 2 puffs into the lungs every 4 (four) hours as needed for wheezing or shortness of breath.  Marland Kitchen aspirin EC 81 MG tablet Take 81 mg by mouth daily.  Marland Kitchen aspirin-acetaminophen-caffeine (EXCEDRIN MIGRAINE) 250-250-65 MG tablet Take 2 tablets by mouth every 6 (six) hours as needed for headache.  Marland Kitchen atorvastatin (LIPITOR) 80 MG tablet Take 80 mg by mouth daily with supper.  . busPIRone (BUSPAR) 30 MG tablet Take 30 mg by mouth 2 (two) times daily.   . canagliflozin (INVOKANA) 100 MG TABS tablet Take 100 mg by  mouth daily before breakfast.  . FLUoxetine (PROZAC) 20 MG capsule Take 60 mg by mouth daily.  . furosemide (LASIX) 40 MG tablet Take 40 mg by mouth daily.  Marland Kitchen gabapentin (NEURONTIN) 400 MG capsule Take 1,200 mg by mouth 3 (three) times daily.   . insulin aspart (NOVOLOG) 100 UNIT/ML injection Inject 0-20 Units into the skin 3 (three) times daily with meals. (Patient taking differently: Inject 45 Units into the skin 3 (three) times daily with meals. )  . insulin glargine (LANTUS) 100 UNIT/ML injection Inject 0.24 mLs (24 Units total) into the skin at bedtime. (Patient taking differently: Inject 80 Units into the skin 2 (two) times daily. )  . lisinopril (PRINIVIL,ZESTRIL) 10 MG tablet Take 1 tablet (10 mg total) by mouth daily. (Patient taking differently:  Take 5 mg by mouth daily. )  . LORazepam (ATIVAN) 1 MG tablet Take 1 mg by mouth every 6 (six) hours as needed for anxiety.  . metFORMIN (GLUCOPHAGE) 1000 MG tablet Take 1 tablet (1,000 mg total) by mouth 2 (two) times daily with a meal.  . omeprazole (PRILOSEC) 20 MG capsule Take 20 mg by mouth daily.  . propranolol (INDERAL) 80 MG tablet Take 1 tablet (80 mg total) by mouth 2 (two) times daily.  . QUEtiapine (SEROQUEL) 25 MG tablet Take 25 mg by mouth at bedtime. Take 1 tablet qhs, and one more tablet prn.  . spironolactone (ALDACTONE) 25 MG tablet Take 25 mg by mouth daily.   Marland Kitchen tiZANidine (ZANAFLEX) 4 MG tablet Take 4 mg by mouth 3 (three) times daily.  . [DISCONTINUED] acetaminophen (TYLENOL) 325 MG tablet Take 2 tablets (650 mg total) by mouth every 6 (six) hours as needed for mild pain (or Fever >/= 101).  . [DISCONTINUED] aspirin 81 MG chewable tablet Chew 81 mg by mouth daily.  . [DISCONTINUED] cyclobenzaprine (FLEXERIL) 10 MG tablet Take 10 mg by mouth 3 (three) times daily.   . [DISCONTINUED] diclofenac sodium (VOLTAREN) 1 % GEL Use up to 4 times a day as needed to the affected area (Patient not taking: Reported on 02/11/2019)  . [DISCONTINUED] guaiFENesin-codeine 100-10 MG/5ML syrup Take 5 mLs by mouth every 4 (four) hours as needed for cough. (Patient not taking: Reported on 02/11/2019)   No facility-administered encounter medications on file as of 02/11/2019.      Assessment and Plan:  Access/Adherence: Patients picks up most medication at [facility]. With only Inovakana and Lorazepam at [facility]. Patient mentions missing 1-2 doses weekly as some days he doesn't wakes up until the lunch time, so he takes his morning dose, skips lunch dose, and resume with dinner dose (insulin, buspirone, tizanidine, and gabapentin. Otherwise, no issues.   Anxiety/Depression: Patient is on fluoxetine, buspirone, lorazepam prn, with no issues. Patient mentions that it all works well.   CHF: patient  on ASA 81 mg EC with no issues  DM: Patient is on gabapentin, invokana, novolog, lantus, and metformin. Patient reports pain in the legs that makign him have a hard time to fall back asleep. Patient is also no ACEi and ARB. Patient checks BG regularly.  HTN: patient on lisinopril, lasix, and aldactone. Patient reports BP have been hgih daily when he checks. Otherwise, no issues.  HLD: patient is on atorvastatin with no issues.   Asthma: Patient uses Ventolin, 3 times a day lately. Patient was able to walk me through stepwise of inhaler use.   Muscle spasm: patient is on tizanidine and cyclobenzaprine with no issues.   Sleep  apnea: patient is on a CPAP machines, but with the leg pain waking him up at night, it is hard for him to fall back asleep. Advise patient to notify MD.  Due to patients non-adherence to medication (missing 2-3x per week), will follow-up in 3-6 months.  Sallye Lat, PharmD Candidate [phone] [place]

## 2019-02-12 ENCOUNTER — Other Ambulatory Visit: Payer: Self-pay

## 2019-02-27 ENCOUNTER — Other Ambulatory Visit: Payer: Self-pay | Admitting: Pharmacist

## 2019-05-20 ENCOUNTER — Ambulatory Visit: Payer: Self-pay | Admitting: Pharmacist

## 2019-05-20 ENCOUNTER — Other Ambulatory Visit: Payer: Self-pay

## 2019-05-20 DIAGNOSIS — Z79899 Other long term (current) drug therapy: Secondary | ICD-10-CM

## 2019-05-20 NOTE — Progress Notes (Addendum)
[facility] Visit Note  Patient: Dustin Williamson MRN: [id number] Date of Birth: Jul 18, 1970 PCP: Donnie Coffin, MD   Dustin Williamson 48 y.o. male presents for a MTM visit today.  There were no vitals taken for this visit.  Patient Information   Past Medical History:  Diagnosis Date  . Anxiety   . Asthma   . CHF (congestive heart failure) ([place])   . Depressed   . Diabetes mellitus without complication ([place])   . Diastolic heart failure ([place])   . Heartburn   . HLD (hyperlipidemia)   . Hypertension   . Kidney stone   . Migraines   . Sleep apnea       Past Surgical History:  Procedure Laterality Date  . CYSTOSCOPY W/ URETERAL STENT REMOVAL N/A 05/27/2016   Procedure: CYSTOSCOPY WITH STENT REMOVAL;  Surgeon: Darcella Cheshire, MD;  Location: [facility] ORS;  Service: Urology;  Laterality: N/A;  . CYSTOSCOPY WITH STENT PLACEMENT Left 05/05/2016   Procedure: CYSTOSCOPY WITH STENT PLACEMENT;  Surgeon: Hollice Espy, MD;  Location: [facility] ORS;  Service: Urology;  Laterality: Left;  . HERNIA REPAIR    . KNEE SURGERY Left 2009     Family History  Problem Relation Age of Onset  . Diabetes Mellitus II Father   . Cancer Father   . Multiple myeloma Father   . Hypertension Mother   . Dementia Paternal Aunt   . Prostate cancer Neg Hx   . Kidney disease Neg Hx   . Bladder Cancer Neg Hx     New Diagnoses (since last visit):     Lifestyle Diet: Breakfast:Egg sandwich Lunch/Dinner:Fasts during lunch, salad, real fruit Drinks:water  Patient does push ups and sit ups ~ twice weekly, and walks 3,000 steps/day on average.            Social History   Substance and Sexual Activity  Alcohol Use No  . Frequency: Never   Comment: been 3-4 yrs since last drank       Social History   Tobacco Use  Smoking Status Former Smoker  . Packs/day: 0.50  . Years: 10.00  . Pack years: 5.00  . Quit date: 06/19/2000  . Years since quitting: 18.9  Smokeless Tobacco Never Used   Tobacco Comment   Hasn't smoked in 15 yrs or so       Health Maintenance  Topic Date Due  . PNEUMOCOCCAL POLYSACCHARIDE VACCINE AGE 74-64 HIGH RISK  07/10/1972  . FOOT EXAM  07/10/1980  . OPHTHALMOLOGY EXAM  07/10/1980  . TETANUS/TDAP  07/10/1989  . HEMOGLOBIN A1C  05/18/2017  . INFLUENZA VACCINE  01/18/2019  . HIV Screening  Completed     Assessment and Plan:  Renal Function:  ~ 1.2-1.5, GFR 50s   Anxiety/Depression: Patient is on fluoxetine 60 mg, buspirone 30 mg, lorazepam prn QPM, with no issues. Patient mentions that it all work well.    Diastolic CHF: ASA 81 mg EC, lisinopril 5 mg, spironolactone, lasix, propranolol.  He follows with [facility].  DM w/ neuropathy: A1c 9.3 last month.  A1c was 10.5 prior.  Gabapentin, invokana, novolog 45 units TIDWM, lantus 80 units BID, and metformin 1000 mg.   Patient checks BG after breakfast (~120-125), before lunch (~170-180), and before dinner (170-180).  BG <200 (rarely up to 250), since starting Invokana.  He will be discontinuing Invokana (migraines, HA, rash around groin) and starting Jardiance.  He reports lowest BG readings ~70, but this happens rarely.  He has been  working hard to change lifestyle and reports weight loss of 315 lbs to 290 lbs with diet/exercise.  Patient has exercise goal of 6,000 steps daily (usually gets to 3000 steps daily).   HTN: Patient on lisinopril, aldactone, propranolol.  Patient takes blood pressure around twice daily.  Today, it was 120/60.  High 160/140.   HLD: Atorvastatin 80 mg.  Patient admits to restless leg syndrome, and legs hurt when they lie still in the evenings  Asthma: Ventolin PRN.  Patient reports to using 3-4 times a day for SOB (aggravated by walking, after around 1500 steps)   Muscle spasm (lower back): Tizanidine 4 mg TID, cyclobenzaprine 10 mg   GERD: Controlled on omeprazole 20 mg.  Headache: Propranolol 80 mg   Sleep apnea/Insomnia: Quetiapine 25 mg, CPAP.  Supplements with  lorazepam.  Admits to usually taking up to 50 mg quetiapine QHS.     Follow up:  Follow up in 6 months to assess control of diabetes, although control is improving.

## 2019-05-21 ENCOUNTER — Encounter: Payer: Self-pay | Admitting: Pharmacist

## 2019-08-08 ENCOUNTER — Other Ambulatory Visit: Payer: Self-pay

## 2019-08-19 ENCOUNTER — Emergency Department: Payer: Self-pay

## 2019-08-19 ENCOUNTER — Other Ambulatory Visit: Payer: Self-pay

## 2019-08-19 ENCOUNTER — Emergency Department
Admission: EM | Admit: 2019-08-19 | Discharge: 2019-08-19 | Disposition: A | Discharge status: Home / Self Care | Payer: Self-pay | Attending: Emergency Medicine | Admitting: Emergency Medicine

## 2019-08-19 DIAGNOSIS — Z79899 Other long term (current) drug therapy: Secondary | ICD-10-CM | POA: Insufficient documentation

## 2019-08-19 DIAGNOSIS — I10 Essential (primary) hypertension: Secondary | ICD-10-CM | POA: Insufficient documentation

## 2019-08-19 DIAGNOSIS — Z7982 Long term (current) use of aspirin: Secondary | ICD-10-CM | POA: Insufficient documentation

## 2019-08-19 DIAGNOSIS — J029 Acute pharyngitis, unspecified: Secondary | ICD-10-CM | POA: Insufficient documentation

## 2019-08-19 DIAGNOSIS — E119 Type 2 diabetes mellitus without complications: Secondary | ICD-10-CM | POA: Insufficient documentation

## 2019-08-19 DIAGNOSIS — Z794 Long term (current) use of insulin: Secondary | ICD-10-CM | POA: Insufficient documentation

## 2019-08-19 DIAGNOSIS — Z87891 Personal history of nicotine dependence: Secondary | ICD-10-CM | POA: Insufficient documentation

## 2019-08-19 LAB — GROUP A STREP BY PCR: Group A Strep by PCR: NOT DETECTED

## 2019-08-19 MED ORDER — LIDOCAINE VISCOUS HCL 2 % MT SOLN: 5.0000 mL | Freq: Four times a day (QID) | OROMUCOSAL | 0 refills | Status: DC | PRN

## 2019-08-19 NOTE — ED Notes (Signed)
See triage note  States he woke up with swelling to tonsils this am denies any pain but states he felt like he was not able to swallow  States sx's are are better  Afebrile on arrival

## 2019-08-19 NOTE — ED Triage Notes (Signed)
Pt presents to ED via POV, ambulatory without difficulty. C/o waking up this morning with swelling to his tonsils. Pt denies pain, no muffled voices sounds, able to maintain his own secretions at this time.

## 2019-08-19 NOTE — Discharge Instructions (Addendum)
Follow discharge care instruction take medication as directed.  If no improvement or worsening complaint follow-up with the ENT clinic by calling the number listed in your discharge care instruction for an appointment.

## 2019-08-19 NOTE — ED Provider Notes (Signed)
[facility] Emergency Department Provider Note   ____________________________________________   First MD Initiated Contact with Patient 08/19/19 1123     (approximate)  I have reviewed the triage vital signs and the nursing notes.   HISTORY  Chief Complaint Sore Throat    HPI Dustin Williamson is a 49 y.o. male patient complain of sore throat upon a.m. awakening.  Patient stated he believes his tonsils are swollen.  Patient denies sore throat but states discomfort with swallowing.  Patient is able to maintain secretions and denies muffled voice.  Patient states similar complaint a few years ago.  There was no objective findings.  Complaint resolved without intervention.  No palliative measure for complaint.         Past Medical History:  Diagnosis Date  . Anxiety   . Asthma   . CHF (congestive heart failure) ([place])   . Depressed   . Diabetes mellitus without complication ([place])   . Diastolic heart failure ([place])   . GERD (gastroesophageal reflux disease)   . Heartburn   . HLD (hyperlipidemia)   . Hypertension   . Kidney stone   . Migraines   . Sleep apnea     Patient Active Problem List   Diagnosis Date Noted  . Rhabdomyolysis 06/11/2018  . Influenza B 06/10/2018  . Hyponatremia 11/15/2016  . OSA (obstructive sleep apnea) 08/12/2016  . HTN (hypertension) 08/12/2016  . Depression 08/12/2016  . Left ureteral stone   . Ureteral obstruction, left 05/22/2016  . Acute left flank pain   . AKI (acute kidney injury) ([place]) 05/04/2016  . Diabetes ([place]) 05/04/2016  . Hypotension 05/04/2016  . Urinary obstruction 05/04/2016  . Hyperglycemia 09/26/2015  . CLOSED FRACTURE OF BASE OF OTHER METACARPAL BONE 03/09/2009    Past Surgical History:  Procedure Laterality Date  . CYSTOSCOPY W/ URETERAL STENT REMOVAL N/A 05/27/2016   Procedure: CYSTOSCOPY WITH STENT REMOVAL;  Surgeon: Darcella Cheshire, MD;  Location: [facility] ORS;  Service: Urology;  Laterality: N/A;    . CYSTOSCOPY WITH STENT PLACEMENT Left 05/05/2016   Procedure: CYSTOSCOPY WITH STENT PLACEMENT;  Surgeon: Hollice Espy, MD;  Location: [facility] ORS;  Service: Urology;  Laterality: Left;  . HERNIA REPAIR    . KNEE SURGERY Left 2009    Prior to Admission medications   Medication Sig Start Date End Date Taking? Authorizing Provider  albuterol (PROVENTIL HFA;VENTOLIN HFA) 108 (90 Base) MCG/ACT inhaler Inhale 2 puffs into the lungs every 4 (four) hours as needed for wheezing or shortness of breath.    [provider]  aspirin EC 81 MG tablet Take 81 mg by mouth daily.    [provider]  aspirin-acetaminophen-caffeine (EXCEDRIN MIGRAINE) [phone] MG tablet Take 2 tablets by mouth every 6 (six) hours as needed for headache.    [provider]  atorvastatin (LIPITOR) 80 MG tablet Take 80 mg by mouth daily with supper.    [provider]  busPIRone (BUSPAR) 30 MG tablet Take 30 mg by mouth 2 (two) times daily.     [provider]  canagliflozin (INVOKANA) 100 MG TABS tablet Take 100 mg by mouth daily before breakfast.    [provider]  FLUoxetine (PROZAC) 20 MG capsule Take 60 mg by mouth daily.    [provider]  furosemide (LASIX) 40 MG tablet Take 40 mg by mouth daily.    [provider]  gabapentin (NEURONTIN) 400 MG capsule Take 1,200 mg by mouth 3 (three) times daily. Per  [facility] prescription records:  TAKE 3 CAPSULES (1200MG) BY MOUTH EVERY MORNING AND AFTERNOON AND 4 CAPSULES (1600MG) AT BEDTIME.    [provider]  insulin aspart (NOVOLOG) 100 UNIT/ML injection Inject 0-20 Units into the skin 3 (three) times daily with meals. Patient taking differently: Inject 45 Units into the skin 3 (three) times daily with meals.  11/16/16   Epifanio Lesches, MD  insulin glargine (LANTUS) 100 UNIT/ML injection Inject 0.24 mLs (24 Units total) into the skin at bedtime. Patient taking differently: Inject 80 Units into the skin  2 (two) times daily.  05/06/16   Loletha Grayer, MD  lidocaine (XYLOCAINE) 2 % solution Use as directed 5 mLs in the mouth or throat every 6 (six) hours as needed for mouth pain. Swish and slow swallow 08/19/19   Sable Feil, PA-C  lisinopril (PRINIVIL,ZESTRIL) 10 MG tablet Take 1 tablet (10 mg total) by mouth daily. Patient taking differently: Take 5 mg by mouth daily.  08/13/16   Dustin Flock, MD  LORazepam (ATIVAN) 1 MG tablet Take 1 mg by mouth every 6 (six) hours as needed for anxiety.    [provider]  metFORMIN (GLUCOPHAGE) 1000 MG tablet Take 1 tablet (1,000 mg total) by mouth 2 (two) times daily with a meal. 08/13/16   Dustin Flock, MD  omeprazole (PRILOSEC) 20 MG capsule Take 20 mg by mouth daily.    [provider]  propranolol (INDERAL) 80 MG tablet Take 1 tablet (80 mg total) by mouth 2 (two) times daily. 08/13/16   Dustin Flock, MD  QUEtiapine (SEROQUEL) 25 MG tablet Take 25 mg by mouth at bedtime. Take 1 tablet qhs, and one more tablet prn.    [provider]  spironolactone (ALDACTONE) 25 MG tablet Take 25 mg by mouth daily.     [provider]  tiZANidine (ZANAFLEX) 4 MG tablet Take 4 mg by mouth every 8 (eight) hours as needed for muscle spasms.     [provider]    Allergies Patient has no known allergies.  Family History  Problem Relation Age of Onset  . Diabetes Mellitus II Father   . Cancer Father   . Multiple myeloma Father   . Hypertension Mother   . Dementia Paternal Aunt   . Prostate cancer Neg Hx   . Kidney disease Neg Hx   . Bladder Cancer Neg Hx     Social History Social History   Tobacco Use  . Smoking status: Former Smoker    Packs/day: 0.50    Years: 10.00    Pack years: 5.00    Quit date: 06/19/2000    Years since quitting: 19.1  . Smokeless tobacco: Never Used  . Tobacco comment: Hasn't smoked in 15 yrs or so   Substance Use Topics  . Alcohol use: No    Comment: been 3-4 yrs since last  drank   . Drug use: Yes    Types: Marijuana    Comment: Smokes 2x a week    Review of Systems  Constitutional: No fever/chills Eyes: No visual changes. ENT: No sore throat. Cardiovascular: Denies chest pain. Respiratory: Denies shortness of breath. Gastrointestinal: No abdominal pain.  No nausea, no vomiting.  No diarrhea.  No constipation. Genitourinary: Negative for dysuria. Musculoskeletal: Negative for back pain. Skin: Negative for rash. Neurological: Negative for headaches, focal weakness or numbness. Psychiatric: Anxiety and depression.  Endocrine:  Diabetes, hyperlipidemia, hypertension. ____________________________________________   PHYSICAL EXAM:  VITAL SIGNS: ED Triage Vitals  Enc Vitals Group  BP 08/19/19 1121 (!) 125/93     Pulse Rate 08/19/19 1121 85     Resp 08/19/19 1121 20     Temp 08/19/19 1121 98.4 F (36.9 C)     Temp Source 08/19/19 1121 Oral     SpO2 08/19/19 1121 96 %     Weight 08/19/19 1122 275 lb (124.7 kg)     Height 08/19/19 1122 5' 7"  (1.702 m)     Head Circumference --      Peak Flow --      Pain Score 08/19/19 1117 0     Pain Loc --      Pain Edu? --      Excl. in [place]? --    Constitutional: Alert and oriented. Well appearing and in no acute distress. Eyes: Conjunctivae are normal. PERRL. EOMI. Head: Atraumatic. Nose: No congestion/rhinnorhea. Mouth/Throat: Mucous membranes are moist.  Oropharynx non-erythematous. Neck: No stridor.  No cervical spine tenderness to palpation. Hematological/Lymphatic/Immunilogical: No cervical lymphadenopathy. Cardiovascular: Normal rate, regular rhythm. Grossly normal heart sounds.  Good peripheral circulation. Respiratory: Normal respiratory effort.  No retractions. Lungs CTAB. Gastrointestinal: Soft and nontender. No distention. No abdominal bruits. No CVA tenderness. Neurologic:  Normal speech and language. No gross focal neurologic deficits are appreciated. No gait instability. Skin:  Skin is  warm, dry and intact. No rash noted. Psychiatric: Mood and affect are normal. Speech and behavior are normal.  ____________________________________________   LABS (all labs ordered are listed, but only abnormal results are displayed)  Labs Reviewed  GROUP A STREP BY PCR   ____________________________________________  EKG   ____________________________________________  RADIOLOGY  ED MD interpretation:    Official radiology report(s): DG Neck Soft Tissue  Result Date: 08/19/2019 CLINICAL DATA:  Dysphagia EXAM: NECK SOFT TISSUES - 1+ VIEW COMPARISON:  09/27/2015 FINDINGS: There is no evidence of retropharyngeal soft tissue swelling or epiglottic enlargement. The cervical airway is unremarkable and no radio-opaque foreign body identified. IMPRESSION: Negative. Electronically Signed   By: Davina Poke D.O.   On: 08/19/2019 12:10    ____________________________________________   PROCEDURES  Procedure(s) performed (including Critical Care):  Procedures   ____________________________________________   INITIAL IMPRESSION / ASSESSMENT AND PLAN / ED COURSE  As part of my medical decision making, I reviewed the following data within the [facility]     Patient presents with sore throat, foreign body sensation, and dysphagia with a.m. awakening.  His exam is grossly unremarkable.  Discussed negative soft tissue neck x-ray and strep results with patient.  Patient given discharge care instructions and advised to follow-up with ENT clinic if no improvement within 3 to 5 days.    Dustin Williamson was evaluated in Emergency Department on 08/19/2019 for the symptoms described in the history of present illness. He was evaluated in the context of the global COVID-19 pandemic, which necessitated consideration that the patient might be at risk for infection with the SARS-CoV-2 virus that causes COVID-19. Institutional protocols and algorithms that pertain to the evaluation of  patients at risk for COVID-19 are in a state of rapid change based on information released by regulatory bodies including the CDC and federal and state organizations. These policies and algorithms were followed during the patient's care in the ED.       ____________________________________________   FINAL CLINICAL IMPRESSION(S) / ED DIAGNOSES  Final diagnoses:  Sore throat     ED Discharge Orders         Ordered    lidocaine (XYLOCAINE) 2 %  solution  Every 6 hours PRN     08/19/19 1319           Note:  This document was prepared using Dragon voice recognition software and may include unintentional dictation errors.    Sable Feil, PA-C 08/19/19 1322    Carrie Mew, MD 08/19/19 [phone]

## 2019-08-27 ENCOUNTER — Telehealth: Payer: Self-pay | Admitting: Pharmacy Technician

## 2019-08-27 NOTE — Telephone Encounter (Signed)
Received updated proof of income.  Patient eligible to receive medication assistance at [facility] until time for re-certification in 9359, and as long as eligibility requirements continue to be met.  [facility]

## 2019-09-18 ENCOUNTER — Other Ambulatory Visit: Payer: Self-pay | Admitting: Adult Health

## 2019-11-26 ENCOUNTER — Other Ambulatory Visit: Payer: Self-pay | Admitting: Family Medicine

## 2020-02-13 ENCOUNTER — Other Ambulatory Visit: Payer: Self-pay | Admitting: Family Medicine

## 2020-02-25 ENCOUNTER — Other Ambulatory Visit: Payer: Self-pay | Admitting: Family Medicine

## 2020-04-06 ENCOUNTER — Other Ambulatory Visit: Payer: Self-pay | Admitting: Family Medicine

## 2020-05-07 ENCOUNTER — Other Ambulatory Visit: Payer: Self-pay

## 2020-05-07 ENCOUNTER — Ambulatory Visit: Payer: Self-pay

## 2020-05-07 VITALS — Wt 246.0 lb

## 2020-05-07 DIAGNOSIS — Z79899 Other long term (current) drug therapy: Secondary | ICD-10-CM

## 2020-05-07 NOTE — Progress Notes (Signed)
Medication Management Clinic Visit Note  Patient: Dustin Williamson MRN: [id number] Date of Birth: Dec 11, 1970 PCP: Donnie Coffin, MD   Dustin Williamson 49 y.o. male presents for a medication therapy management via telephone visit today.  There were no vitals taken for this visit.  Patient Information   Past Medical History:  Diagnosis Date  . Anxiety   . Asthma   . CHF (congestive heart failure) ([place])   . Depressed   . Diabetes mellitus without complication ([place])   . Diastolic heart failure ([place])   . GERD (gastroesophageal reflux disease)   . Heartburn   . HLD (hyperlipidemia)   . Hypertension   . Kidney stone   . Migraines   . Sleep apnea       Past Surgical History:  Procedure Laterality Date  . CYSTOSCOPY W/ URETERAL STENT REMOVAL N/A 05/27/2016   Procedure: CYSTOSCOPY WITH STENT REMOVAL;  Surgeon: Darcella Cheshire, MD;  Location: [facility] ORS;  Service: Urology;  Laterality: N/A;  . CYSTOSCOPY WITH STENT PLACEMENT Left 05/05/2016   Procedure: CYSTOSCOPY WITH STENT PLACEMENT;  Surgeon: Hollice Espy, MD;  Location: [facility] ORS;  Service: Urology;  Laterality: Left;  . HERNIA REPAIR    . KNEE SURGERY Left 2009     Family History  Problem Relation Age of Onset  . Diabetes Mellitus II Father   . Cancer Father   . Multiple myeloma Father   . Hypertension Mother   . Dementia Paternal Aunt   . Prostate cancer Neg Hx   . Kidney disease Neg Hx   . Bladder Cancer Neg Hx     New Diagnoses (since last visit): none  Family Support: Good  Lifestyle Diet: Breakfast: None Lunch: sandwich  Dinner: [place], spaghetti, or steak & salad Drinks: water            Social History   Substance and Sexual Activity  Alcohol Use No   Comment: been 3-4 yrs since last drank       Social History   Tobacco Use  Smoking Status Former Smoker  . Packs/day: 0.50  . Years: 10.00  . Pack years: 5.00  . Quit date: 06/19/2000  . Years since quitting: 19.8  Smokeless Tobacco Never Used   Tobacco Comment   Hasn't smoked in 15 yrs or so       Health Maintenance  Topic Date Due  . Hepatitis C Screening  Never done  . PNEUMOCOCCAL POLYSACCHARIDE VACCINE AGE 46-64 HIGH RISK  Never done  . FOOT EXAM  Never done  . OPHTHALMOLOGY EXAM  Never done  . COVID-19 Vaccine (1) Never done  . TETANUS/TDAP  Never done  . HEMOGLOBIN A1C  05/18/2017  . INFLUENZA VACCINE  01/18/2020  . HIV Screening  Completed   Outpatient Encounter Medications as of 05/07/2020  Medication Sig  . albuterol (PROVENTIL HFA;VENTOLIN HFA) 108 (90 Base) MCG/ACT inhaler Inhale 2 puffs into the lungs every 4 (four) hours as needed for wheezing or shortness of breath.  Marland Kitchen aspirin EC 81 MG tablet Take 81 mg by mouth daily.  Marland Kitchen aspirin-acetaminophen-caffeine (EXCEDRIN MIGRAINE) 250-250-65 MG tablet Take 2 tablets by mouth every 6 (six) hours as needed for headache.  Marland Kitchen atorvastatin (LIPITOR) 80 MG tablet Take 80 mg by mouth daily with supper.  . busPIRone (BUSPAR) 30 MG tablet Take 30 mg by mouth 2 (two) times daily.   . canagliflozin (INVOKANA) 100 MG TABS tablet Take 100 mg by mouth daily before breakfast.  . FLUoxetine (PROZAC)  20 MG capsule Take 60 mg by mouth daily.  . furosemide (LASIX) 40 MG tablet Take 40 mg by mouth daily.  Marland Kitchen gabapentin (NEURONTIN) 400 MG capsule Take 1,200 mg by mouth 3 (three) times daily. Per [facility] prescription records:  TAKE 3 CAPSULES (1200MG) BY MOUTH EVERY MORNING AND AFTERNOON AND 4 CAPSULES (1600MG) AT BEDTIME.  . insulin aspart (NOVOLOG) 100 UNIT/ML injection Inject 0-20 Units into the skin 3 (three) times daily with meals. (Patient taking differently: Inject 45 Units into the skin 3 (three) times daily with meals. )  . insulin glargine (LANTUS) 100 UNIT/ML injection Inject 0.24 mLs (24 Units total) into the skin at bedtime. (Patient taking differently: Inject 80 Units into the skin 2 (two) times daily. )  . lidocaine (XYLOCAINE) 2 % solution Use as directed 5 mLs in the mouth or  throat every 6 (six) hours as needed for mouth pain. Swish and slow swallow  . lisinopril (PRINIVIL,ZESTRIL) 10 MG tablet Take 1 tablet (10 mg total) by mouth daily. (Patient taking differently: Take 5 mg by mouth daily. )  . LORazepam (ATIVAN) 1 MG tablet Take 1 mg by mouth every 6 (six) hours as needed for anxiety.  . metFORMIN (GLUCOPHAGE) 1000 MG tablet Take 1 tablet (1,000 mg total) by mouth 2 (two) times daily with a meal.  . omeprazole (PRILOSEC) 20 MG capsule Take 20 mg by mouth daily.  . propranolol (INDERAL) 80 MG tablet Take 1 tablet (80 mg total) by mouth 2 (two) times daily.  . QUEtiapine (SEROQUEL) 25 MG tablet Take 25 mg by mouth at bedtime. Take 1 tablet qhs, and one more tablet prn.  . spironolactone (ALDACTONE) 25 MG tablet Take 25 mg by mouth daily.   Marland Kitchen tiZANidine (ZANAFLEX) 4 MG tablet Take 4 mg by mouth every 8 (eight) hours as needed for muscle spasms.    No facility-administered encounter medications on file as of 05/07/2020.   Health Maintenance/Date Completed  Last ED visit: 08/19/19 Last Visit to PCP: over a year ago  Next Visit to PCP: attempting to schedule in January  Dental Exam: unknown  Eye Exam: unknown  Prostate Exam: 2021 Colonoscopy: 2021 Flu Vaccine: 2020 COVID-19 Vaccine: pt refused  Assessment and Plan:  Diabetes: Pt takes novolog 40 units three times daily, Lantus 160 units daily, and metformin 1000 mg twice daily. Pt has lost over 30 lbs and reports blood glucose levels improving. Pt is continuing to work on weight loss with diet and exercise. Pt also recently got a dog that has been a great companion for exercise. Most recent A1c on file 11/15/16: 11.0%. Pt is working on setting up an appt with PCP - planning to see provider in January.  Neuropathy: Pt complains of feet and leg pain. Pt takes gabapentin 400 mg twice a day and 600 mg in the evening - pt states this does help relieve some of the pain.   Shortness of breath: Pt uses albuterol as  needed. Pt reports use 2-3x/day.   Hypertension: Pt takes lisinopril 5 mg daily and propranolol 80 mg twice daily. Pt does not monitor blood pressure at home. Most recent recording 125/93 on 08/19/19.  Mental health: Pt no longer has mental health coverage and is no longer on mental health medications (buspirone, fluoxetine, quetiapine). Pt does not feel they are needed at this time - informed pt that PCP should be able to address any of these needs if pt feels differently at a later time.   Cardiovascular: Pt takes  aspirin 81 mg daily, atorvastatin 80 mg daily, spironolactone 25 mg daily, and furosemide 40 mg as needed.   Migraine: Pt takes Excedrin migraine as needed.  Muscle spasms: Pt takes tizanidine 4 mg every 8 hours.   Acid reflux: Pt takes omeprazole 20 mg in the morning and 40 mg in the evening.   Follow up for Outreach MTM in 1 year  Benn Moulder, PharmD Pharmacy Resident  05/07/2020 10:10 AM

## 2020-06-02 ENCOUNTER — Other Ambulatory Visit: Payer: Self-pay

## 2020-07-22 ENCOUNTER — Other Ambulatory Visit: Payer: Self-pay | Admitting: Family Medicine

## 2020-08-05 ENCOUNTER — Other Ambulatory Visit: Payer: Self-pay | Admitting: Emergency Medicine

## 2020-08-05 ENCOUNTER — Emergency Department: Payer: Self-pay

## 2020-08-05 ENCOUNTER — Other Ambulatory Visit: Payer: Self-pay

## 2020-08-05 ENCOUNTER — Emergency Department
Admission: EM | Admit: 2020-08-05 | Discharge: 2020-08-05 | Disposition: A | Discharge status: Home / Self Care | Payer: Self-pay | Attending: Emergency Medicine | Admitting: Emergency Medicine

## 2020-08-05 DIAGNOSIS — Z79899 Other long term (current) drug therapy: Secondary | ICD-10-CM | POA: Insufficient documentation

## 2020-08-05 DIAGNOSIS — I110 Hypertensive heart disease with heart failure: Secondary | ICD-10-CM | POA: Insufficient documentation

## 2020-08-05 DIAGNOSIS — R7989 Other specified abnormal findings of blood chemistry: Secondary | ICD-10-CM

## 2020-08-05 DIAGNOSIS — Z7984 Long term (current) use of oral hypoglycemic drugs: Secondary | ICD-10-CM | POA: Insufficient documentation

## 2020-08-05 DIAGNOSIS — I5030 Unspecified diastolic (congestive) heart failure: Secondary | ICD-10-CM | POA: Insufficient documentation

## 2020-08-05 DIAGNOSIS — Z87891 Personal history of nicotine dependence: Secondary | ICD-10-CM | POA: Insufficient documentation

## 2020-08-05 DIAGNOSIS — Z20822 Contact with and (suspected) exposure to covid-19: Secondary | ICD-10-CM | POA: Insufficient documentation

## 2020-08-05 DIAGNOSIS — K859 Acute pancreatitis without necrosis or infection, unspecified: Secondary | ICD-10-CM | POA: Insufficient documentation

## 2020-08-05 DIAGNOSIS — J45909 Unspecified asthma, uncomplicated: Secondary | ICD-10-CM | POA: Insufficient documentation

## 2020-08-05 DIAGNOSIS — Z7982 Long term (current) use of aspirin: Secondary | ICD-10-CM | POA: Insufficient documentation

## 2020-08-05 DIAGNOSIS — R945 Abnormal results of liver function studies: Secondary | ICD-10-CM | POA: Insufficient documentation

## 2020-08-05 DIAGNOSIS — R748 Abnormal levels of other serum enzymes: Secondary | ICD-10-CM

## 2020-08-05 DIAGNOSIS — E1165 Type 2 diabetes mellitus with hyperglycemia: Secondary | ICD-10-CM | POA: Insufficient documentation

## 2020-08-05 DIAGNOSIS — Z794 Long term (current) use of insulin: Secondary | ICD-10-CM | POA: Insufficient documentation

## 2020-08-05 LAB — CBC
HCT: 47.6 % (ref 39.0–52.0)
Hemoglobin: 16.8 g/dL (ref 13.0–17.0)
MCH: 29.9 pg (ref 26.0–34.0)
MCHC: 35.3 g/dL (ref 30.0–36.0)
MCV: 84.8 fL (ref 80.0–100.0)
Platelets: 276 10*3/uL (ref 150–400)
RBC: 5.61 MIL/uL (ref 4.22–5.81)
RDW: 12.4 % (ref 11.5–15.5)
WBC: 13.3 10*3/uL — ABNORMAL HIGH (ref 4.0–10.5)
nRBC: 0 % (ref 0.0–0.2)

## 2020-08-05 LAB — HEPATIC FUNCTION PANEL
ALT: 22 U/L (ref 0–44)
AST: 23 U/L (ref 15–41)
Albumin: 4.3 g/dL (ref 3.5–5.0)
Alkaline Phosphatase: 139 U/L — ABNORMAL HIGH (ref 38–126)
Bilirubin, Direct: 0.1 mg/dL (ref 0.0–0.2)
Indirect Bilirubin: 1.5 mg/dL — ABNORMAL HIGH (ref 0.3–0.9)
Total Bilirubin: 1.6 mg/dL — ABNORMAL HIGH (ref 0.3–1.2)
Total Protein: 8.4 g/dL — ABNORMAL HIGH (ref 6.5–8.1)

## 2020-08-05 LAB — BASIC METABOLIC PANEL
Anion gap: 15 (ref 5–15)
BUN: 9 mg/dL (ref 6–20)
CO2: 20 mmol/L — ABNORMAL LOW (ref 22–32)
Calcium: 9.4 mg/dL (ref 8.9–10.3)
Chloride: 94 mmol/L — ABNORMAL LOW (ref 98–111)
Creatinine, Ser: 0.96 mg/dL (ref 0.61–1.24)
GFR, Estimated: >60 mL/min (ref >60)
Glucose, Bld: 331 mg/dL — ABNORMAL HIGH (ref 70–99)
Potassium: 3.5 mmol/L (ref 3.5–5.1)
Sodium: 129 mmol/L — ABNORMAL LOW (ref 135–145)

## 2020-08-05 LAB — LIPASE, BLOOD: Lipase: 64 U/L — ABNORMAL HIGH (ref 11–51)

## 2020-08-05 LAB — RESP PANEL BY RT-PCR (FLU A&B, COVID) ARPGX2
Influenza A by PCR: NEGATIVE
Influenza B by PCR: NEGATIVE
SARS Coronavirus 2 by RT PCR: NEGATIVE

## 2020-08-05 MED ORDER — SODIUM CHLORIDE 0.9 % IV BOLUS
1000.0000 mL | Freq: Once | INTRAVENOUS | Status: AC
  Administered 2020-08-05: 1000 mL via INTRAVENOUS

## 2020-08-05 MED ORDER — ONDANSETRON HCL 4 MG PO TABS: 4.0000 mg | ORAL_TABLET | Freq: Three times a day (TID) | ORAL | 0 refills | Status: DC | PRN

## 2020-08-05 MED ORDER — IOHEXOL 300 MG/ML  SOLN
100.0000 mL | Freq: Once | INTRAMUSCULAR | Status: AC | PRN
  Administered 2020-08-05: 100 mL via INTRAVENOUS

## 2020-08-05 MED ORDER — KETOROLAC TROMETHAMINE 30 MG/ML IJ SOLN
15.0000 mg | Freq: Once | INTRAMUSCULAR | Status: AC
  Administered 2020-08-05: 15 mg via INTRAVENOUS
  Filled 2020-08-05: qty 1

## 2020-08-05 MED ORDER — ONDANSETRON HCL 4 MG/2ML IJ SOLN
4.0000 mg | Freq: Once | INTRAMUSCULAR | Status: DC
  Filled 2020-08-05: qty 2

## 2020-08-05 MED ORDER — METOCLOPRAMIDE HCL 5 MG/ML IJ SOLN
10.0000 mg | Freq: Once | INTRAMUSCULAR | Status: AC
  Administered 2020-08-05: 10 mg via INTRAVENOUS
  Filled 2020-08-05: qty 2

## 2020-08-05 NOTE — ED Triage Notes (Signed)
Pt here from [place] clinic.   Reports nausea x3 weeks, has been taking dramamine d/t to possible vertigo with no relief. Denies any abdominal pain. Reports weight loss of over 5-6lbs today due to vomiting. Unable to tolerate PO food/fluids. Reports "splitting" headache that started today. Denies other symptoms.

## 2020-08-05 NOTE — Discharge Instructions (Addendum)
Please seek medical attention for any high fevers, chest pain, shortness of breath, change in behavior, persistent vomiting, bloody stool or any other new or concerning symptoms.  

## 2020-08-05 NOTE — ED Provider Notes (Signed)
CT scan without concerning acute findings. On my exam patient without any abdominal tenderness. States that his nausea and vomiting is improved. Will plan on discharging home with antiemetics.    Phineas Semen, MD 08/05/20 2220

## 2020-08-05 NOTE — ED Provider Notes (Signed)
[facility] Emergency Department Provider Note ____________________________________________   Event Date/Time   First MD Initiated Contact with Patient 08/05/20 1703     (approximate)  I have reviewed the triage vital signs and the nursing notes.   HISTORY  Chief Complaint Nausea    HPI Dustin Williamson is a 50 y.o. male with PMH as noted below who presents with nausea and vomiting since approximately 1 AM, persistent course, nonbloody, not associated with abdominal pain or diarrhea.  He also reports some nausea intermittently over the last few weeks.  He states he ate [employer] takeout last night and felt well at that time.  He denies any fever or chills.  He states that his mother is also currently sick although she has more generalized fatigue and malaise rather than GI symptoms.   Past Medical History:  Diagnosis Date  . Anxiety   . Asthma   . CHF (congestive heart failure) ([place])   . Depressed   . Diabetes mellitus without complication ([place])   . Diastolic heart failure ([place])   . GERD (gastroesophageal reflux disease)   . Heartburn   . HLD (hyperlipidemia)   . Hypertension   . Kidney stone   . Migraines   . Sleep apnea     Patient Active Problem List   Diagnosis Date Noted  . Rhabdomyolysis 06/11/2018  . Influenza B 06/10/2018  . Hyponatremia 11/15/2016  . OSA (obstructive sleep apnea) 08/12/2016  . HTN (hypertension) 08/12/2016  . Depression 08/12/2016  . Left ureteral stone   . Ureteral obstruction, left 05/22/2016  . Acute left flank pain   . AKI (acute kidney injury) ([place]) 05/04/2016  . Diabetes ([place]) 05/04/2016  . Hypotension 05/04/2016  . Urinary obstruction 05/04/2016  . Hyperglycemia 09/26/2015  . CLOSED FRACTURE OF BASE OF OTHER METACARPAL BONE 03/09/2009    Past Surgical History:  Procedure Laterality Date  . CYSTOSCOPY W/ URETERAL STENT REMOVAL N/A 05/27/2016   Procedure: CYSTOSCOPY WITH STENT REMOVAL;  Surgeon: Darcella Cheshire, MD;  Location: [facility] ORS;  Service: Urology;  Laterality: N/A;  . CYSTOSCOPY WITH STENT PLACEMENT Left 05/05/2016   Procedure: CYSTOSCOPY WITH STENT PLACEMENT;  Surgeon: Hollice Espy, MD;  Location: [facility] ORS;  Service: Urology;  Laterality: Left;  . HERNIA REPAIR    . KNEE SURGERY Left 2009    Prior to Admission medications   Medication Sig Start Date End Date Taking? Authorizing Provider  albuterol (PROVENTIL HFA;VENTOLIN HFA) 108 (90 Base) MCG/ACT inhaler Inhale 2 puffs into the lungs every 4 (four) hours as needed for wheezing or shortness of breath.    [provider]  aspirin EC 81 MG tablet Take 81 mg by mouth daily.    [provider]  aspirin-acetaminophen-caffeine (EXCEDRIN MIGRAINE) 360-032-3233 MG tablet Take 2 tablets by mouth every 6 (six) hours as needed for headache.    [provider]  atorvastatin (LIPITOR) 80 MG tablet Take 80 mg by mouth daily with supper.    [provider]  furosemide (LASIX) 40 MG tablet Take 40 mg by mouth daily. As needed    [provider]  gabapentin (NEURONTIN) 400 MG capsule Take 400 mg by mouth 2 (two) times daily. Per [facility] prescription records:  TAKE 3 CAPSULES (1200MG) BY MOUTH EVERY MORNING AND AFTERNOON AND 4 CAPSULES (1600MG) AT BEDTIME.  Pt reports taking 400 mg twice daily and 600 mg in the evening    [provider]  insulin aspart (NOVOLOG) 100 UNIT/ML injection Inject  0-20 Units into the skin 3 (three) times daily with meals. Patient taking differently: Inject 40 Units into the skin 3 (three) times daily with meals.  11/16/16   Epifanio Lesches, MD  insulin glargine (LANTUS) 100 UNIT/ML injection Inject 0.24 mLs (24 Units total) into the skin at bedtime. Patient taking differently: Inject 160 Units into the skin daily.  05/06/16   Loletha Grayer, MD  lisinopril (PRINIVIL,ZESTRIL) 10 MG tablet Take 1 tablet (10 mg total) by mouth daily. Patient taking differently: Take 5 mg by  mouth daily.  08/13/16   Dustin Flock, MD  metFORMIN (GLUCOPHAGE) 1000 MG tablet Take 1 tablet (1,000 mg total) by mouth 2 (two) times daily with a meal. 08/13/16   Dustin Flock, MD  omeprazole (PRILOSEC) 20 MG capsule Take 20 mg by mouth daily. Pt takes 20 mg in the morning and 40 mg at night    [provider]  propranolol (INDERAL) 80 MG tablet Take 1 tablet (80 mg total) by mouth 2 (two) times daily. 08/13/16   Dustin Flock, MD  spironolactone (ALDACTONE) 25 MG tablet Take 25 mg by mouth daily.     [provider]  tiZANidine (ZANAFLEX) 4 MG tablet Take 4 mg by mouth every 8 (eight) hours as needed for muscle spasms.     [provider]    Allergies Patient has no known allergies.  Family History  Problem Relation Age of Onset  . Diabetes Mellitus II Father   . Cancer Father   . Multiple myeloma Father   . Hypertension Mother   . Dementia Paternal Aunt   . Prostate cancer Neg Hx   . Kidney disease Neg Hx   . Bladder Cancer Neg Hx     Social History Social History   Tobacco Use  . Smoking status: Former Smoker    Packs/day: 0.50    Years: 10.00    Pack years: 5.00    Quit date: 06/19/2000    Years since quitting: 20.1  . Smokeless tobacco: Never Used  . Tobacco comment: Hasn't smoked in 15 yrs or so   Vaping Use  . Vaping Use: Former  Substance Use Topics  . Alcohol use: No    Comment: been 3-4 yrs since last drank   . Drug use: Yes    Types: Marijuana    Comment: Smokes 2x a week    Review of Systems  Constitutional: No fever. Eyes: No redness. ENT: No sore throat. Cardiovascular: Denies chest pain. Respiratory: Denies shortness of breath. Gastrointestinal: Positive for nausea and vomiting.  No diarrhea Genitourinary: Negative for dysuria.  Musculoskeletal: Negative for back pain. Skin: Negative for rash. Neurological: Positive for headache.   ____________________________________________   PHYSICAL EXAM:  VITAL  SIGNS: ED Triage Vitals  Enc Vitals Group     BP 08/05/20 1636 (!) 170/126     Pulse Rate 08/05/20 1636 98     Resp 08/05/20 1636 18     Temp 08/05/20 1636 98.3 F (36.8 C)     Temp Source 08/05/20 1636 Oral     SpO2 08/05/20 1636 98 %     Weight 08/05/20 1637 219 lb (99.3 kg)     Height 08/05/20 1637 _0  (1.702 m)     Head Circumference --      Peak Flow --      Pain Score 08/05/20 1637 5     Pain Loc --      Pain Edu? --      Excl. in  GC? --     Constitutional: Alert and oriented.  Uncomfortable appearing but in no acute distress. Eyes: Conjunctivae are normal.  No scleral icterus. Head: Atraumatic. Nose: No congestion/rhinnorhea. Mouth/Throat: Mucous membranes are slightly dry.   Neck: Normal range of motion.  Cardiovascular: Good peripheral circulation. Respiratory: Normal respiratory effort.  No retractions.  Gastrointestinal: Soft and nontender.  No distention.  Genitourinary: No flank tenderness. Musculoskeletal: Extremities warm and well perfused.  Neurologic:  Normal speech and language. No gross focal neurologic deficits are appreciated.  Skin:  Skin is warm and dry. No rash noted. Psychiatric: Mood and affect are normal. Speech and behavior are normal.  ____________________________________________   LABS (all labs ordered are listed, but only abnormal results are displayed)  Labs Reviewed  BASIC METABOLIC PANEL - Abnormal; Notable for the following components:      Result Value   Sodium 129 (*)    Chloride 94 (*)    CO2 20 (*)    Glucose, Bld 331 (*)    All other components within normal limits  CBC - Abnormal; Notable for the following components:   WBC 13.3 (*)    All other components within normal limits  LIPASE, BLOOD - Abnormal; Notable for the following components:   Lipase 64 (*)    All other components within normal limits  HEPATIC FUNCTION PANEL - Abnormal; Notable for the following components:   Total Protein 8.4 (*)    Alkaline  Phosphatase 139 (*)    Total Bilirubin 1.6 (*)    Indirect Bilirubin 1.5 (*)    All other components within normal limits  RESP PANEL BY RT-PCR (FLU A&B, COVID) ARPGX2  URINALYSIS, COMPLETE (UACMP) WITH MICROSCOPIC   ____________________________________________  EKG  ED ECG REPORT I, Arta Silence, the attending physician, personally viewed and interpreted this ECG.  Date: 08/05/2020 EKG Time: 1628 Rate: 109 Rhythm: Sinus tachycardia QRS Axis: normal Intervals: normal ST/T Wave abnormalities: normal Narrative Interpretation: no evidence of acute ischemia  ____________________________________________  RADIOLOGY  US abdomen RUQ: No gallstones or evidence of cholecystitis.  Normal CBD.  ____________________________________________   PROCEDURES  Procedure(s) performed: No  Procedures  Critical Care performed: No ____________________________________________   INITIAL IMPRESSION / ASSESSMENT AND PLAN / ED COURSE  Pertinent labs & imaging results that were available during my care of the patient were reviewed by me and considered in my medical decision making (see chart for details).  50 year old male with PMH as noted above asthma, CHF, diabetes presents with acute onset of nausea and vomiting since early this morning but also with some intermittent nausea over the last few weeks.  He has no abdominal pain.  On exam, the patient is uncomfortable appearing and intermittently vomiting.  His vital signs are normal except for hypertension.  The abdomen is soft and nontender.  Exam is otherwise unremarkable.  Differential includes gastroenteritis, gastritis, gastroparesis, PUD, pancreatitis or other hepatobiliary cause, or less likely colitis, diverticulitis.  We will obtain labs, give fluids and antiemetic and reassess.  ----------------------------------------- 7:32 PM on 08/05/2020 -----------------------------------------  Lab work-up reveals mild hyponatremia,  leukocytosis, and mildly elevated lipase and alkaline phosphatase.  It is possible that he is having mild pancreatitis.  However he has no significant abdominal pain.  I doubt cholecystitis or other acute hepatobiliary cause.  The patient does not drink regularly and has not had any alcohol recently.  I have ordered a right upper quadrant ultrasound for further evaluation.  ----------------------------------------- 8:45 PM on 08/05/2020 -----------------------------------------  Right upper quadrant  ultrasound does not show any acute findings.  The patient has had recurrent nausea, as well as some headache.  I will obtain a CT of the abdomen for further evaluation.  If the CT is reassuring and he is tolerating p.o., he may build to go home.  Otherwise he may need admission if he has refractory vomiting.  I signed the patient out to the oncoming physician Dr. Archie Balboa.  ____________________________________________   FINAL CLINICAL IMPRESSION(S) / ED DIAGNOSES  Final diagnoses:  Elevated LFTs  Acute pancreatitis, unspecified complication status, unspecified pancreatitis type      NEW MEDICATIONS STARTED DURING THIS VISIT:  New Prescriptions   No medications on file     Note:  This document was prepared using Dragon voice recognition software and may include unintentional dictation errors.    Arta Silence, MD 08/05/20 2046

## 2020-08-26 ENCOUNTER — Other Ambulatory Visit: Payer: Self-pay | Admitting: Family Medicine

## 2020-09-24 ENCOUNTER — Other Ambulatory Visit: Payer: Self-pay

## 2020-09-24 MED FILL — Gabapentin Cap 400 MG: ORAL | 30 days supply | Qty: 100 | Fill #0 | Status: AC

## 2020-09-24 MED FILL — Tizanidine HCl Tab 4 MG (Base Equivalent): ORAL | 2 days supply | Qty: 6 | Fill #0 | Status: AC

## 2020-09-27 ENCOUNTER — Other Ambulatory Visit: Payer: Self-pay

## 2020-09-28 ENCOUNTER — Other Ambulatory Visit: Payer: Self-pay

## 2020-09-28 ENCOUNTER — Telehealth: Payer: Self-pay | Admitting: Pharmacist

## 2020-09-28 MED ORDER — TIZANIDINE HCL 4 MG PO TABS
ORAL_TABLET | ORAL | 1 refills | Status: DC
  Filled 2020-09-28: qty 48, 16d supply, fill #0
  Filled 2020-10-19 (×2): qty 48, 16d supply, fill #1

## 2020-09-28 NOTE — Telephone Encounter (Signed)
PATIENT [facility] TILL 09/17/21--aj  09/28/2021

## 2020-09-29 ENCOUNTER — Other Ambulatory Visit: Payer: Self-pay

## 2020-10-04 ENCOUNTER — Other Ambulatory Visit: Payer: Self-pay

## 2020-10-04 MED ORDER — LISINOPRIL 5 MG PO TABS
ORAL_TABLET | ORAL | 5 refills | Status: DC
  Filled 2020-10-04: qty 15, 30d supply, fill #0
  Filled 2020-11-08: qty 15, 30d supply, fill #1
  Filled 2020-12-13: qty 15, 30d supply, fill #2
  Filled 2021-02-17: qty 15, 30d supply, fill #3
  Filled 2021-03-16: qty 15, 30d supply, fill #4
  Filled 2021-04-14: qty 15, 30d supply, fill #5

## 2020-10-04 MED ORDER — QUETIAPINE FUMARATE 25 MG PO TABS
25.0000 mg | ORAL_TABLET | Freq: Every day | ORAL | 5 refills | Status: DC
  Filled 2020-10-04: qty 30, 30d supply, fill #0
  Filled 2020-11-08: qty 30, 30d supply, fill #1
  Filled 2020-12-13: qty 30, 30d supply, fill #2
  Filled 2021-01-19: qty 30, 30d supply, fill #3
  Filled 2021-03-01: qty 30, 30d supply, fill #4
  Filled 2021-03-31: qty 30, 30d supply, fill #5

## 2020-10-04 MED ORDER — BUSPIRONE HCL 15 MG PO TABS
ORAL_TABLET | ORAL | 5 refills | Status: DC
  Filled 2020-10-04: qty 60, 30d supply, fill #0
  Filled 2020-11-08: qty 60, 30d supply, fill #1
  Filled 2021-03-14: qty 60, 30d supply, fill #2
  Filled 2021-04-14: qty 60, 30d supply, fill #3
  Filled 2021-05-20: qty 60, 30d supply, fill #4
  Filled 2021-06-22: qty 60, 30d supply, fill #5

## 2020-10-04 MED ORDER — DULOXETINE HCL 30 MG PO CPEP
ORAL_CAPSULE | ORAL | 2 refills | Status: DC
  Filled 2020-10-04: qty 30, 30d supply, fill #0
  Filled 2021-01-19: qty 30, 30d supply, fill #1

## 2020-10-05 ENCOUNTER — Other Ambulatory Visit: Payer: Self-pay

## 2020-10-06 ENCOUNTER — Other Ambulatory Visit: Payer: Self-pay

## 2020-10-07 ENCOUNTER — Other Ambulatory Visit: Payer: Self-pay

## 2020-10-08 ENCOUNTER — Other Ambulatory Visit: Payer: Self-pay

## 2020-10-19 ENCOUNTER — Other Ambulatory Visit: Payer: Self-pay

## 2020-10-19 ENCOUNTER — Telehealth: Payer: Self-pay | Admitting: Pharmacist

## 2020-10-19 MED FILL — Gabapentin Cap 400 MG: ORAL | 30 days supply | Qty: 100 | Fill #1 | Status: CN

## 2020-10-19 MED FILL — Gabapentin Cap 400 MG: ORAL | 30 days supply | Qty: 100 | Fill #1 | Status: AC

## 2020-10-19 NOTE — Telephone Encounter (Signed)
10/19/2020 9:43:32 AM - Renewal Tresiba,Novolog & tips  -- Rhetta Mura - Tuesday, Oct 19, 2020 9:40 AM --[employer] renewal for : Novolog Vials max 125 units (45AM,40@lunch ,40@dinner ) 17vials Novofine 32G tips  2 boxes Tresiba Flextouch Max 160 units(Inject 160 units once daily,11boxes.

## 2020-10-26 ENCOUNTER — Other Ambulatory Visit: Payer: Self-pay

## 2020-10-26 MED ORDER — DULOXETINE HCL 30 MG PO CPEP
ORAL_CAPSULE | ORAL | 2 refills | Status: DC
  Filled 2020-10-26: qty 60, 30d supply, fill #0
  Filled 2020-12-13: qty 60, 30d supply, fill #1
  Filled 2021-02-07: qty 60, 30d supply, fill #2

## 2020-11-01 ENCOUNTER — Other Ambulatory Visit: Payer: Self-pay

## 2020-11-01 MED FILL — Propranolol HCl Tab 20 MG: ORAL | 30 days supply | Qty: 240 | Fill #0 | Status: AC

## 2020-11-02 ENCOUNTER — Other Ambulatory Visit: Payer: Self-pay

## 2020-11-02 MED FILL — Gabapentin Cap 400 MG: ORAL | 30 days supply | Qty: 100 | Fill #2 | Status: CN

## 2020-11-02 MED FILL — Gabapentin Cap 400 MG: ORAL | 10 days supply | Qty: 100 | Fill #2 | Status: AC

## 2020-11-04 ENCOUNTER — Other Ambulatory Visit: Payer: Self-pay

## 2020-11-04 MED ORDER — TIZANIDINE HCL 4 MG PO TABS
ORAL_TABLET | ORAL | 3 refills | Status: DC
  Filled 2020-11-04: qty 90, 30d supply, fill #0
  Filled 2020-12-13: qty 90, 30d supply, fill #1
  Filled 2021-01-19: qty 90, 30d supply, fill #2
  Filled 2021-02-17: qty 90, 30d supply, fill #3

## 2020-11-04 MED ORDER — GABAPENTIN 400 MG PO CAPS
ORAL_CAPSULE | ORAL | 5 refills | Status: DC
  Filled 2020-11-04: qty 300, 25d supply, fill #0
  Filled 2020-11-19: qty 200, 20d supply, fill #0
  Filled 2020-12-17: qty 300, 30d supply, fill #1
  Filled 2021-01-19: qty 300, 30d supply, fill #2
  Filled 2021-02-17: qty 300, 30d supply, fill #3
  Filled 2021-03-31: qty 300, 30d supply, fill #4
  Filled 2021-05-03: qty 300, 30d supply, fill #5
  Filled 2021-06-06: qty 100, 10d supply, fill #6

## 2020-11-05 ENCOUNTER — Other Ambulatory Visit: Payer: Self-pay

## 2020-11-08 ENCOUNTER — Other Ambulatory Visit: Payer: Self-pay

## 2020-11-08 MED FILL — Metformin HCl Tab 1000 MG: ORAL | 90 days supply | Qty: 180 | Fill #0 | Status: AC

## 2020-11-08 MED FILL — Atorvastatin Calcium Tab 80 MG (Base Equivalent): ORAL | 90 days supply | Qty: 90 | Fill #0 | Status: AC

## 2020-11-09 ENCOUNTER — Other Ambulatory Visit: Payer: Self-pay

## 2020-11-10 ENCOUNTER — Other Ambulatory Visit: Payer: Self-pay

## 2020-11-10 MED ORDER — FUROSEMIDE 40 MG PO TABS
ORAL_TABLET | ORAL | 4 refills | Status: DC
  Filled 2020-11-10: qty 30, 30d supply, fill #0

## 2020-11-10 MED ORDER — SPIRONOLACTONE 25 MG PO TABS
0.5000 | ORAL_TABLET | Freq: Every day | ORAL | 1 refills | Status: DC
  Filled 2020-11-10: qty 45, 90d supply, fill #0
  Filled 2021-03-16: qty 45, 90d supply, fill #1

## 2020-11-19 ENCOUNTER — Other Ambulatory Visit: Payer: Self-pay

## 2020-11-22 ENCOUNTER — Other Ambulatory Visit: Payer: Self-pay

## 2020-11-22 MED ORDER — NOVOFINE PEN NEEDLE 32G X 6 MM MISC
11 refills | Status: DC
  Filled 2020-11-22: qty 200, 200d supply, fill #0

## 2020-11-23 ENCOUNTER — Telehealth: Payer: Self-pay | Admitting: Pharmacist

## 2020-11-23 ENCOUNTER — Other Ambulatory Visit: Payer: Self-pay

## 2020-11-23 NOTE — Telephone Encounter (Signed)
11/23/2020 2:33:08 PM - ProAir application faxed to [employer]  -- Rhetta Mura - Tuesday, November 23, 2020 2:32 PM --Arneta Cliche [employer] application for ProAir [facility] for enrollment (replaces Ventolin)

## 2020-11-30 ENCOUNTER — Other Ambulatory Visit: Payer: Self-pay

## 2020-11-30 MED ORDER — INSULIN ASPART 100 UNIT/ML IJ SOLN
125.0000 [IU] | INTRAMUSCULAR | 0 refills | Status: DC
  Filled 2020-11-30: qty 150, 136d supply, fill #0
  Filled 2021-02-07: qty 150, 136d supply, fill #1
  Filled ????-??-??: fill #1

## 2020-11-30 MED ORDER — TRESIBA FLEXTOUCH 200 UNIT/ML ~~LOC~~ SOPN
160.0000 [IU] | PEN_INJECTOR | SUBCUTANEOUS | 0 refills | Status: DC
  Filled 2020-11-30: qty 99, 124d supply, fill #0
  Filled 2021-02-17: qty 90, 124d supply, fill #1

## 2020-11-30 MED ORDER — NOVOFINE PEN NEEDLE 32G X 6 MM MISC
0 refills | Status: DC
  Filled 2020-11-30: qty 200, 200d supply, fill #0

## 2020-12-13 ENCOUNTER — Other Ambulatory Visit: Payer: Self-pay

## 2020-12-13 MED FILL — Propranolol HCl Tab 20 MG: ORAL | 30 days supply | Qty: 240 | Fill #1 | Status: AC

## 2020-12-16 ENCOUNTER — Other Ambulatory Visit: Payer: Self-pay

## 2020-12-17 ENCOUNTER — Other Ambulatory Visit: Payer: Self-pay

## 2020-12-21 ENCOUNTER — Other Ambulatory Visit: Payer: Self-pay

## 2020-12-21 MED ORDER — VENTOLIN HFA 108 (90 BASE) MCG/ACT IN AERS
INHALATION_SPRAY | RESPIRATORY_TRACT | 3 refills | Status: DC
  Filled 2020-12-21: qty 54, fill #0

## 2020-12-24 ENCOUNTER — Emergency Department: Payer: Self-pay

## 2020-12-24 ENCOUNTER — Other Ambulatory Visit: Payer: Self-pay

## 2020-12-24 ENCOUNTER — Emergency Department
Admission: EM | Admit: 2020-12-24 | Discharge: 2020-12-24 | Disposition: A | Discharge status: Home / Self Care | Payer: Self-pay | Attending: Emergency Medicine | Admitting: Emergency Medicine

## 2020-12-24 DIAGNOSIS — Z79899 Other long term (current) drug therapy: Secondary | ICD-10-CM | POA: Insufficient documentation

## 2020-12-24 DIAGNOSIS — Z794 Long term (current) use of insulin: Secondary | ICD-10-CM | POA: Insufficient documentation

## 2020-12-24 DIAGNOSIS — Z7984 Long term (current) use of oral hypoglycemic drugs: Secondary | ICD-10-CM | POA: Insufficient documentation

## 2020-12-24 DIAGNOSIS — I5030 Unspecified diastolic (congestive) heart failure: Secondary | ICD-10-CM | POA: Insufficient documentation

## 2020-12-24 DIAGNOSIS — R079 Chest pain, unspecified: Secondary | ICD-10-CM | POA: Insufficient documentation

## 2020-12-24 DIAGNOSIS — M5412 Radiculopathy, cervical region: Secondary | ICD-10-CM | POA: Insufficient documentation

## 2020-12-24 DIAGNOSIS — Z7982 Long term (current) use of aspirin: Secondary | ICD-10-CM | POA: Insufficient documentation

## 2020-12-24 DIAGNOSIS — Z87891 Personal history of nicotine dependence: Secondary | ICD-10-CM | POA: Insufficient documentation

## 2020-12-24 DIAGNOSIS — E119 Type 2 diabetes mellitus without complications: Secondary | ICD-10-CM | POA: Insufficient documentation

## 2020-12-24 DIAGNOSIS — J45909 Unspecified asthma, uncomplicated: Secondary | ICD-10-CM | POA: Insufficient documentation

## 2020-12-24 DIAGNOSIS — I110 Hypertensive heart disease with heart failure: Secondary | ICD-10-CM | POA: Insufficient documentation

## 2020-12-24 DIAGNOSIS — M25521 Pain in right elbow: Secondary | ICD-10-CM | POA: Insufficient documentation

## 2020-12-24 DIAGNOSIS — M79601 Pain in right arm: Secondary | ICD-10-CM | POA: Insufficient documentation

## 2020-12-24 LAB — CBC
HCT: 49.2 % (ref 39.0–52.0)
Hemoglobin: 17 g/dL (ref 13.0–17.0)
MCH: 31.6 pg (ref 26.0–34.0)
MCHC: 34.6 g/dL (ref 30.0–36.0)
MCV: 91.4 fL (ref 80.0–100.0)
Platelets: 200 10*3/uL (ref 150–400)
RBC: 5.38 MIL/uL (ref 4.22–5.81)
RDW: 12.1 % (ref 11.5–15.5)
WBC: 9.3 10*3/uL (ref 4.0–10.5)
nRBC: 0 % (ref 0.0–0.2)

## 2020-12-24 LAB — BASIC METABOLIC PANEL
Anion gap: 11 (ref 5–15)
BUN: 15 mg/dL (ref 6–20)
CO2: 19 mmol/L — ABNORMAL LOW (ref 22–32)
Calcium: 8.7 mg/dL — ABNORMAL LOW (ref 8.9–10.3)
Chloride: 100 mmol/L (ref 98–111)
Creatinine, Ser: 0.93 mg/dL (ref 0.61–1.24)
GFR, Estimated: >60 mL/min (ref >60)
Glucose, Bld: 384 mg/dL — ABNORMAL HIGH (ref 70–99)
Potassium: 4.1 mmol/L (ref 3.5–5.1)
Sodium: 130 mmol/L — ABNORMAL LOW (ref 135–145)

## 2020-12-24 LAB — TROPONIN I (HIGH SENSITIVITY)
Troponin I (High Sensitivity): 3 ng/L (ref <18)
Troponin I (High Sensitivity): 3 ng/L (ref <18)

## 2020-12-24 MED ORDER — GABAPENTIN 300 MG PO CAPS
300.0000 mg | ORAL_CAPSULE | Freq: Once | ORAL | Status: AC
  Administered 2020-12-24: 300 mg via ORAL
  Filled 2020-12-24: qty 1

## 2020-12-24 MED ORDER — PREDNISONE 10 MG PO TABS
ORAL_TABLET | ORAL | 0 refills | Status: DC
  Filled 2020-12-24: qty 21, 6d supply, fill #0

## 2020-12-24 NOTE — ED Triage Notes (Signed)
Pt c/o waking with right sided chest pain that radiates into the neck and arm today. Denies SOB , denies injury, pt is ambulatory with no distress noted

## 2020-12-24 NOTE — Discharge Instructions (Addendum)
Please seek medical attention for any high fevers, chest pain, shortness of breath, change in behavior, persistent vomiting, bloody stool or any other new or concerning symptoms.  

## 2020-12-24 NOTE — ED Notes (Signed)
Pt laughed when I advised I was giving him prescribed gabapentin ordered from MD. He advised he takes 400mg  at home 3x a day and it probably will not help. He also asked about a TV remote. I advised I would try to find him one.

## 2020-12-24 NOTE — ED Notes (Signed)
Pt to bed 2. Pt smells of marijuana. When asked pt admits to smoking several times a day because as stated "it keeps my mind right". Pt advised his CP has been constant since this morning about 0600am.

## 2020-12-24 NOTE — ED Provider Notes (Signed)
[facility] Emergency Department Provider Note   ____________________________________________   I have reviewed the triage vital signs and the nursing notes.   HISTORY  Chief Complaint Chest Pain   History limited by: Not Limited   HPI Dustin Williamson is a 50 y.o. male who presents to the emergency department today because of concern for right chest,arm and neck pain. The patient states that the symptoms woke him up this morning. The patient says that he did not do any unusual activity or exertion yesterday. When he woke up this morning first noticed his pain in his right chest. It then went into his right arm and down to his elbow. When he moves his head in certain ways he can relieve the pain in his chest however the pain then becomes worse in his neck. The patient denies any associated shortness of breath. Denies any fevers.     Records reviewed. Per medical record review patient has a history of asthma, CHF, DM, GERD.   Past Medical History:  Diagnosis Date   Anxiety    Asthma    CHF (congestive heart failure) ([place])    Depressed    Diabetes mellitus without complication ([place])    Diastolic heart failure (HCC)    GERD (gastroesophageal reflux disease)    Heartburn    HLD (hyperlipidemia)    Hypertension    Kidney stone    Migraines    Sleep apnea     Patient Active Problem List   Diagnosis Date Noted   Rhabdomyolysis 06/11/2018   Influenza B 06/10/2018   Hyponatremia 11/15/2016   OSA (obstructive sleep apnea) 08/12/2016   HTN (hypertension) 08/12/2016   Depression 08/12/2016   Left ureteral stone    Ureteral obstruction, left 05/22/2016   Acute left flank pain    AKI (acute kidney injury) ([place]) 05/04/2016   Diabetes ([place]) 05/04/2016   Hypotension 05/04/2016   Urinary obstruction 05/04/2016   Hyperglycemia 09/26/2015   CLOSED FRACTURE OF BASE OF OTHER METACARPAL BONE 03/09/2009    Past Surgical History:  Procedure Laterality Date    CYSTOSCOPY W/ URETERAL STENT REMOVAL N/A 05/27/2016   Procedure: CYSTOSCOPY WITH STENT REMOVAL;  Surgeon: Darcella Cheshire, MD;  Location: [facility] ORS;  Service: Urology;  Laterality: N/A;   CYSTOSCOPY WITH STENT PLACEMENT Left 05/05/2016   Procedure: CYSTOSCOPY WITH STENT PLACEMENT;  Surgeon: Hollice Espy, MD;  Location: [facility] ORS;  Service: Urology;  Laterality: Left;   HERNIA REPAIR     KNEE SURGERY Left 2009    Prior to Admission medications   Medication Sig Start Date End Date Taking? Authorizing Provider  albuterol (PROVENTIL HFA;VENTOLIN HFA) 108 (90 Base) MCG/ACT inhaler Inhale 2 puffs into the lungs every 4 (four) hours as needed for wheezing or shortness of breath.    [provider]  aspirin EC 81 MG tablet Take 81 mg by mouth daily.    [provider]  aspirin-acetaminophen-caffeine (EXCEDRIN MIGRAINE) 339 [id number] MG tablet Take 2 tablets by mouth every 6 (six) hours as needed for headache.    [provider]  atorvastatin (LIPITOR) 80 MG tablet Take 80 mg by mouth daily with supper.    [provider]  atorvastatin (LIPITOR) 80 MG tablet TAKE ONE TABLET BY MOUTH AT BEDTIME FOR HIGH CHOLESTEROL 02/13/20 02/12/21  Aycock, Ngwe A, MD  busPIRone (BUSPAR) 15 MG tablet Take 1 tablet by mouth twice a day for mood 10/04/20     DULoxetine (CYMBALTA) 30 MG capsule Take 1 capsule by  mouth once a day for mood and pain 10/04/20     DULoxetine (CYMBALTA) 30 MG capsule Take 1 capsule by mouth twice a day for mood and pain 10/26/20     furosemide (LASIX) 40 MG tablet Take 40 mg by mouth daily. As needed    [provider]  furosemide (LASIX) 40 MG tablet TAKE 1 TABLET (40 MG TOTAL) BY MOUTH DAILY AS NEEDED FOR SWELLING. 09/18/19 09/17/20  Ramm, Vito Backers, NP  furosemide (LASIX) 40 MG tablet TAKE 1 TABLET (40 MG TOTAL) BY MOUTH ONCE DAILY AS NEEDED FOR SWELLING. 11/10/20     gabapentin (NEURONTIN) 400 MG capsule Take 400 mg by mouth 2 (two) times daily. Per [facility] prescription  records:  TAKE 3 CAPSULES (1200MG) BY MOUTH EVERY MORNING AND AFTERNOON AND 4 CAPSULES (1600MG) AT BEDTIME.  Pt reports taking 400 mg twice daily and 600 mg in the evening    [provider]  gabapentin (NEURONTIN) 400 MG capsule TAKE 3 CAPSULES BY MOUTH EVERY MORNING AND AFTERNOON AND TAKE 4 CAPSULES BY MOUTHAT BEDTIME 11/03/20     insulin aspart (NOVOLOG) 100 UNIT/ML injection Inject 0-20 Units into the skin 3 (three) times daily with meals. Patient taking differently: Inject 40 Units into the skin 3 (three) times daily with meals.  11/16/16   Epifanio Lesches, MD  insulin aspart (NOVOLOG) 100 UNIT/ML injection INJECT 45 UNITS UNDER THE SKIN IN THE MORNING, 40 UNITS AT LUNCH AND 40 UNITS AT DINNER. 10/06/20     insulin degludec (TRESIBA FLEXTOUCH) 200 UNIT/ML FlexTouch Pen INJECT 160 UNITS UNDER THE SKIN ONCE DAILY. 10/06/20     insulin glargine (LANTUS) 100 UNIT/ML injection Inject 0.24 mLs (24 Units total) into the skin at bedtime. Patient taking differently: Inject 160 Units into the skin daily.  05/06/16   Loletha Grayer, MD  Insulin Pen Needle (NOVOFINE PEN NEEDLE) 32G X 6 MM MISC Use as directed 11/22/20   Cammy Copa, [place]  Insulin Pen Needle (NOVOFINE PEN NEEDLE) 32G X 6 MM MISC USE AS DIRECTED. 10/06/20     lisinopril (PRINIVIL,ZESTRIL) 10 MG tablet Take 1 tablet (10 mg total) by mouth daily. Patient taking differently: Take 5 mg by mouth daily.  08/13/16   Dustin Flock, MD  lisinopril (ZESTRIL) 5 MG tablet Take 1/2 tablet (2.38m total) by mouth once daily for kidney protection. 10/04/20     metFORMIN (GLUCOPHAGE) 1000 MG tablet Take 1 tablet (1,000 mg total) by mouth 2 (two) times daily with a meal. 08/13/16   PDustin Flock MD  metFORMIN (GLUCOPHAGE) 1000 MG tablet TAKE ONE TABLET BY MOUTH 2 TIMES A DAY FOR DIABETES 11/26/19 02/06/21  Aycock, Ngwe A, MD  NOVOLOG 100 UNIT/ML injection INJECT 45 UNITS UNDER THE SKIN EVERY MORNING. THEN 40 UNITS AT LUNCH. THEN 40 UNITS AT  DINNER. 07/22/20 07/22/21  Aycock, Ngwe A, MD  omeprazole (PRILOSEC) 20 MG capsule Take 20 mg by mouth daily. Pt takes 20 mg in the morning and 40 mg at night    [provider]  ondansetron (ZOFRAN) 4 MG tablet TAKE ONE TABLET BY MOUTH EVERY 8 HOURS AS NEEDED 08/05/20 08/05/21  GNance Pear MD  propranolol (INDERAL) 20 MG tablet TAKE 4 TABLETS BY MOUTH TWICE DAILY FOR HEADACHE PREVENTION AND BLOOD PRESSURE 04/06/20 01/16/21  Aycock, Ngwe A, MD  propranolol (INDERAL) 80 MG tablet Take 1 tablet (80 mg total) by mouth 2 (two) times daily. 08/13/16   PDustin Flock MD  QUEtiapine (SEROQUEL) 25 MG tablet Take 1 tablet by  mouth every night 10/04/20     spironolactone (ALDACTONE) 25 MG tablet Take 25 mg by mouth daily.     [provider]  spironolactone (ALDACTONE) 25 MG tablet TAKE 0.5 TABLETS (12.5 MG TOTAL) BY MOUTH DAILY. 09/18/19 09/17/20  Lars Masson, NP  spironolactone (ALDACTONE) 25 MG tablet TAKE 0.5 TABLET (12.5 MG TOTAL) BY MOUTH ONCE DAILY. 11/10/20     tiZANidine (ZANAFLEX) 4 MG tablet Take 4 mg by mouth every 8 (eight) hours as needed for muscle spasms.     [provider]  tiZANidine (ZANAFLEX) 4 MG tablet Take 1 tablet by mouth three times a day as needed for muscle spasticity 11/04/20     VENTOLIN HFA 108 (90 Base) MCG/ACT inhaler INHALE 2 PUFFS EVERY 4 TO 6 HOURS AS NEEDED FOR WHEEZING. 12/21/20 12/21/21      Allergies Patient has no known allergies.  Family History  Problem Relation Age of Onset   Diabetes Mellitus II Father    Cancer Father    Multiple myeloma Father    Hypertension Mother    Dementia Paternal Aunt    Prostate cancer Neg Hx    Kidney disease Neg Hx    Bladder Cancer Neg Hx     Social History Social History   Tobacco Use   Smoking status: Former    Packs/day: 0.50    Years: 10.00    Pack years: 5.00    Types: Cigarettes    Quit date: 06/19/2000    Years since quitting: 20.5   Smokeless tobacco: Never   Tobacco comments:     Hasn't smoked in 15 yrs or so   Vaping Use   Vaping Use: Former  Substance Use Topics   Alcohol use: No    Comment: been 3-4 yrs since last drank    Drug use: Yes    Types: Marijuana    Comment: Smokes 2x a week    Review of Systems Constitutional: No fever/chills Eyes: No visual changes. ENT: No sore throat. Cardiovascular: Positive for right chest pain. Respiratory: Denies shortness of breath. Gastrointestinal: No abdominal pain.  No nausea, no vomiting.  No diarrhea.   Genitourinary: Negative for dysuria. Musculoskeletal: Positive for right neck and arm pain. Skin: Negative for rash. Neurological: Negative for headaches, focal weakness or numbness.  ____________________________________________   PHYSICAL EXAM:  VITAL SIGNS: ED Triage Vitals  Enc Vitals Group     BP 12/24/20 1057 (!) 115/98     Pulse Rate 12/24/20 1057 68     Resp 12/24/20 1057 20     Temp 12/24/20 1057 97.8 F (36.6 C)     Temp Source 12/24/20 1057 Oral     SpO2 12/24/20 1057 95 %     Weight 12/24/20 1059 215 lb (97.5 kg)     Height 12/24/20 1059 _0  (1.676 m)     Head Circumference --      Peak Flow --      Pain Score 12/24/20 1059 7    Constitutional: Alert and oriented.  Eyes: Conjunctivae are normal.  ENT      Head: Normocephalic and atraumatic.      Nose: No congestion/rhinnorhea.      Mouth/Throat: Mucous membranes are moist.      Neck: No stridor. Hematological/Lymphatic/Immunilogical: No cervical lymphadenopathy. Cardiovascular: Normal rate, regular rhythm.  No murmurs, rubs, or gallops.   Respiratory: Normal respiratory effort without tachypnea nor retractions. Breath sounds are clear and equal bilaterally. No wheezes/rales/rhonchi. Gastrointestinal: Soft and non tender. No  rebound. No guarding.  Genitourinary: Deferred Musculoskeletal: Normal range of motion in all extremities. No lower extremity edema. Neurologic:  Normal speech and language. No gross focal neurologic  deficits are appreciated.  Skin:  Skin is warm, dry and intact. No rash noted. Psychiatric: Mood and affect are normal. Speech and behavior are normal. Patient exhibits appropriate insight and judgment.  ____________________________________________    LABS (pertinent positives/negatives)  CBC wbc 9.3, hgb 17.0, plt 200 Trop hs 3 x 2 BMP na 130, k 4.1, glu 384, cr 0.93 ____________________________________________   EKG  I, Nance Pear, attending physician, personally viewed and interpreted this EKG  EKG Time: 1055 Rate: 72 Rhythm: normal sinus rhythm Axis: normal Intervals: qtc 411 QRS: narrow ST changes: no st elevation Impression: normal ekg ____________________________________________    RADIOLOGY  CXR No active cardiopulmonary disease  ____________________________________________   PROCEDURES  Procedures  ____________________________________________   INITIAL IMPRESSION / ASSESSMENT AND PLAN / ED COURSE  Pertinent labs & imaging results that were available during my care of the patient were reviewed by me and considered in my medical decision making (see chart for details).  Patient presents to the emergency department today because of concern for right sided chest pain/neck pain and right arm pain. Work up with negative CXR and troponin negative x 2. Doubt PE. Given that the pain does change when he moves his neck do think likely cervical radiculopathy. Will plan on giving patient prescription for steroids to help with inflammation. Discussed importance of follow up with his physician.   ____________________________________________   FINAL CLINICAL IMPRESSION(S) / ED DIAGNOSES  Final diagnoses:  Cervical radiculopathy     Note: This dictation was prepared with Dragon dictation. Any transcriptional errors that result from this process are unintentional     Nance Pear, MD 12/24/20 1538

## 2020-12-29 ENCOUNTER — Other Ambulatory Visit: Payer: Self-pay

## 2020-12-30 ENCOUNTER — Other Ambulatory Visit: Payer: Self-pay

## 2020-12-30 MED ORDER — ALBUTEROL SULFATE HFA 108 (90 BASE) MCG/ACT IN AERS
INHALATION_SPRAY | RESPIRATORY_TRACT | 3 refills | Status: DC
  Filled 2020-12-30: qty 25.5, 50d supply, fill #0
  Filled 2021-03-01 – 2021-03-14 (×2): qty 25.5, 50d supply, fill #1
  Filled 2021-05-17: qty 25.5, 50d supply, fill #2

## 2021-01-19 ENCOUNTER — Other Ambulatory Visit: Payer: Self-pay

## 2021-01-28 ENCOUNTER — Other Ambulatory Visit: Payer: Self-pay

## 2021-01-31 ENCOUNTER — Other Ambulatory Visit: Payer: Self-pay

## 2021-02-03 ENCOUNTER — Other Ambulatory Visit: Payer: Self-pay

## 2021-02-03 MED ORDER — PROPRANOLOL HCL 20 MG PO TABS
ORAL_TABLET | ORAL | 5 refills | Status: DC
  Filled 2021-02-03: qty 240, 30d supply, fill #0
  Filled 2021-03-16: qty 240, 30d supply, fill #1
  Filled 2021-04-14: qty 240, 30d supply, fill #2
  Filled 2021-05-20: qty 240, 30d supply, fill #3
  Filled 2021-06-22: qty 240, 30d supply, fill #4
  Filled 2021-07-19: qty 240, 30d supply, fill #5

## 2021-02-04 ENCOUNTER — Other Ambulatory Visit: Payer: Self-pay

## 2021-02-07 ENCOUNTER — Other Ambulatory Visit: Payer: Self-pay

## 2021-02-07 MED ORDER — RIGHTEST GL300 LANCETS MISC
99 refills | Status: AC
  Filled 2021-02-07: qty 100, 25d supply, fill #0
  Filled 2021-03-16: qty 100, 25d supply, fill #1
  Filled 2021-04-14: qty 100, 25d supply, fill #2
  Filled 2021-06-06: qty 100, 25d supply, fill #3
  Filled 2021-10-31: qty 100, 25d supply, fill #4

## 2021-02-07 MED ORDER — RIGHTEST GS550 BLOOD GLUCOSE VI STRP
ORAL_STRIP | 99 refills | Status: AC
  Filled 2021-02-07: qty 100, 25d supply, fill #0
  Filled 2021-03-16: qty 100, 25d supply, fill #1
  Filled 2021-04-14: qty 100, 25d supply, fill #2
  Filled 2021-06-06: qty 100, 25d supply, fill #3
  Filled 2021-10-31: qty 100, 25d supply, fill #4

## 2021-02-17 ENCOUNTER — Other Ambulatory Visit: Payer: Self-pay

## 2021-02-23 ENCOUNTER — Other Ambulatory Visit: Payer: Self-pay

## 2021-02-28 ENCOUNTER — Other Ambulatory Visit: Payer: Self-pay

## 2021-02-28 MED ORDER — NOVOFINE PEN NEEDLE 32G X 6 MM MISC
99 refills | Status: DC
Start: 2021-02-28 — End: 2021-03-15

## 2021-03-01 ENCOUNTER — Other Ambulatory Visit: Payer: Self-pay

## 2021-03-14 ENCOUNTER — Other Ambulatory Visit: Payer: Self-pay

## 2021-03-15 ENCOUNTER — Other Ambulatory Visit: Payer: Self-pay

## 2021-03-15 MED ORDER — NOVOFINE PEN NEEDLE 32G X 6 MM MISC
99 refills | Status: AC
  Filled 2021-03-31: qty 200, fill #0

## 2021-03-16 ENCOUNTER — Other Ambulatory Visit: Payer: Self-pay

## 2021-03-16 MED ORDER — ATORVASTATIN CALCIUM 80 MG PO TABS
ORAL_TABLET | ORAL | 3 refills | Status: AC
  Filled 2021-03-16: qty 90, 90d supply, fill #0
  Filled 2021-06-22: qty 90, 90d supply, fill #1
  Filled 2021-09-14: qty 90, 90d supply, fill #2

## 2021-03-16 MED ORDER — TIZANIDINE HCL 4 MG PO TABS
ORAL_TABLET | ORAL | 5 refills | Status: DC
  Filled 2021-03-16: qty 90, 30d supply, fill #0
  Filled 2021-04-14: qty 90, 30d supply, fill #1
  Filled 2021-06-06: qty 90, 30d supply, fill #2
  Filled 2021-07-04: qty 90, 30d supply, fill #3

## 2021-03-16 MED ORDER — METFORMIN HCL 1000 MG PO TABS
ORAL_TABLET | ORAL | 3 refills | Status: AC
  Filled 2021-03-16: qty 180, 90d supply, fill #0
  Filled 2021-06-22: qty 180, 90d supply, fill #1
  Filled 2021-09-14: qty 180, 90d supply, fill #2

## 2021-03-16 MED ORDER — DULOXETINE HCL 30 MG PO CPEP
ORAL_CAPSULE | ORAL | 5 refills | Status: DC
  Filled 2021-03-16: qty 60, 30d supply, fill #0
  Filled 2021-04-14: qty 60, 30d supply, fill #1
  Filled 2021-05-20: qty 60, 30d supply, fill #2
  Filled 2021-06-22: qty 60, 30d supply, fill #3

## 2021-03-18 ENCOUNTER — Other Ambulatory Visit: Payer: Self-pay

## 2021-03-31 ENCOUNTER — Other Ambulatory Visit: Payer: Self-pay

## 2021-04-01 ENCOUNTER — Other Ambulatory Visit: Payer: Self-pay

## 2021-04-14 ENCOUNTER — Other Ambulatory Visit: Payer: Self-pay

## 2021-04-20 ENCOUNTER — Other Ambulatory Visit: Payer: Self-pay

## 2021-05-03 ENCOUNTER — Other Ambulatory Visit: Payer: Self-pay

## 2021-05-05 ENCOUNTER — Other Ambulatory Visit: Payer: Self-pay

## 2021-05-05 MED ORDER — QUETIAPINE FUMARATE 25 MG PO TABS
25.0000 mg | ORAL_TABLET | Freq: Every day | ORAL | 5 refills | Status: AC
  Filled 2021-05-05: qty 30, 30d supply, fill #0
  Filled 2021-06-06: qty 30, 30d supply, fill #1
  Filled 2021-07-19: qty 30, 30d supply, fill #2
  Filled 2021-08-29: qty 30, 30d supply, fill #3
  Filled 2021-09-29: qty 30, 30d supply, fill #4
  Filled 2021-10-31: qty 30, 30d supply, fill #5

## 2021-05-17 ENCOUNTER — Other Ambulatory Visit: Payer: Self-pay

## 2021-05-20 ENCOUNTER — Other Ambulatory Visit: Payer: Self-pay

## 2021-05-23 ENCOUNTER — Other Ambulatory Visit: Payer: Self-pay

## 2021-05-23 MED ORDER — LISINOPRIL 5 MG PO TABS
ORAL_TABLET | ORAL | 11 refills | Status: DC
  Filled 2021-05-23: qty 15, 30d supply, fill #0

## 2021-05-24 ENCOUNTER — Other Ambulatory Visit: Payer: Self-pay

## 2021-06-01 ENCOUNTER — Other Ambulatory Visit: Payer: Self-pay

## 2021-06-02 ENCOUNTER — Other Ambulatory Visit: Payer: Self-pay

## 2021-06-02 MED ORDER — INSULIN DEGLUDEC 200 UNIT/ML ~~LOC~~ SOPN
160.0000 [IU] | PEN_INJECTOR | SUBCUTANEOUS | 1 refills | Status: DC
  Filled 2021-06-02: qty 99, fill #0

## 2021-06-03 ENCOUNTER — Other Ambulatory Visit: Payer: Self-pay

## 2021-06-06 ENCOUNTER — Other Ambulatory Visit: Payer: Self-pay

## 2021-06-17 ENCOUNTER — Other Ambulatory Visit: Payer: Self-pay

## 2021-06-22 ENCOUNTER — Encounter: Payer: Self-pay | Admitting: Pharmacist

## 2021-06-22 ENCOUNTER — Other Ambulatory Visit: Payer: Self-pay

## 2021-06-22 ENCOUNTER — Ambulatory Visit: Payer: Self-pay | Admitting: Pharmacist

## 2021-06-22 VITALS — BP 102/72 | Wt 209.0 lb

## 2021-06-22 DIAGNOSIS — Z79899 Other long term (current) drug therapy: Secondary | ICD-10-CM

## 2021-06-22 NOTE — Progress Notes (Signed)
[facility] Visit Note  Patient: Dustin Williamson MRN: [id number] Date of Birth: 04-07-1971 PCP: Dustin Coffin, MD   Dustin Williamson 51 y.o. male presents for a MTM visit today.  BP 102/72    Wt 209 lb (94.8 kg)    BMI 33.73 kg/m   Patient Information   Past Medical History:  Diagnosis Date   Anxiety    Asthma    CHF (congestive heart failure) (H[facility])    Depressed    Diabetes mellitus without complication (HCC)    Diastolic heart failure (HCC)    GERD (gastroesophageal reflux disease)    Heartburn    HLD (hyperlipidemia)    Hypertension    Kidney stone    Migraines    Neuropathy    Sleep apnea       Past Surgical History:  Procedure Laterality Date   CYSTOSCOPY W/ URETERAL STENT REMOVAL N/A 05/27/2016   Procedure: CYSTOSCOPY WITH STENT REMOVAL;  Surgeon: Dustin Cheshire, MD;  Location: [facility] ORS;  Service: Urology;  Laterality: N/A;   CYSTOSCOPY WITH STENT PLACEMENT Left 05/05/2016   Procedure: CYSTOSCOPY WITH STENT PLACEMENT;  Surgeon: Dustin Espy, MD;  Location: [facility] ORS;  Service: Urology;  Laterality: Left;   HERNIA REPAIR     KNEE SURGERY Left 2009     Family History  Problem Relation Age of Onset   Hypertension Mother    High blood pressure Mother    Diabetes Mellitus II Father    Cancer Father    Multiple myeloma Father    Hypertension Brother    Dementia Paternal Aunt    Prostate cancer Neg Hx    Kidney disease Neg Hx    Bladder Cancer Neg Hx     New Diagnoses (since last visit):   Family Support: Good  Lifestyle Diet: Breakfast: Bacon, eggs, cheese sandwich, or cereal Lunch: None Dinner: Pork chops, fruit, veggies. No fast foods. Drinks: Water, tea, regular soda.    Current Exercise Habits: Home exercise routine, Type of exercise: strength training/weights, Frequency (Times/Week): 3, Intensity: Mild       Social History   Substance and Sexual Activity  Alcohol Use No   Comment: been 3-4 yrs since last drank        Social History   Tobacco Use  Smoking Status Former   Packs/day: 0.50   Years: 10.00   Pack years: 5.00   Types: Cigarettes   Quit date: 06/19/2000   Years since quitting: 21.0  Smokeless Tobacco Never  Tobacco Comments   Hasn't smoked in 15 yrs or so       Health Maintenance  Topic Date Due   COVID-19 Vaccine (1) Never done   Pneumococcal Vaccine 50-76 Years old (1 - PCV) Never done   FOOT EXAM  Never done   OPHTHALMOLOGY EXAM  Never done   Hepatitis C Screening  Never done   TETANUS/TDAP  Never done   Zoster Vaccines- Shingrix (1 of 2) Never done   COLONOSCOPY (Pts 45-60yr Insurance coverage will need to be confirmed)  Never done   HEMOGLOBIN A1C  05/18/2017   INFLUENZA VACCINE  01/17/2021   HIV Screening  Completed   HPV VACCINES  Aged Out   Health Maintenance/Date Completed  Last ED visit: 12/24/20 Last Visit to PCP: Years Next Visit to PCP: N/A Specialist Visit: N/A Dental Exam: Years Eye Exam: Years Prostate Exam: No Pelvic/PAP Exam: N/A Mammogram: N/A DEXA: N/A Colonoscopy: Recently, could not locate date. Flu Vaccine: No Pneumonia  Vaccine: No COVID-19 Vaccine: No Shingrix Vaccine: No Outpatient Encounter Medications as of 06/22/2021  Medication Sig   aspirin-acetaminophen-caffeine (EXCEDRIN MIGRAINE) 250-250-65 MG tablet Take 2 tablets by mouth every 6 (six) hours as needed for headache.   atorvastatin (LIPITOR) 80 MG tablet TAKE ONE TABLET BY MOUTH AT BEDTIME FOR HIGH CHOLESTEROL   busPIRone (BUSPAR) 15 MG tablet Take 1 tablet by mouth twice a day for mood   DULoxetine (CYMBALTA) 30 MG capsule Take 1 capsule by mouth twice a day for mood and pain.   furosemide (LASIX) 40 MG tablet TAKE 1 TABLET (40 MG TOTAL) BY MOUTH ONCE DAILY AS NEEDED FOR SWELLING.   gabapentin (NEURONTIN) 400 MG capsule TAKE 3 CAPSULES BY MOUTH EVERY MORNING AND AFTERNOON AND TAKE 4 CAPSULES BY MOUTH AT BEDTIME   glucose blood (RIGHTEST GS550 BLOOD GLUCOSE) test strip use as  directed   insulin degludec (TRESIBA FLEXTOUCH) 200 UNIT/ML FlexTouch Pen INJECT 160 UNITS UNDER THE SKIN ONCE DAILY. (Patient taking differently: Inject 160 Units into the skin. Patient reports taking 120-160 units.)   Insulin Pen Needle (NOVOFINE PEN NEEDLE) 32G X 6 MM MISC USE AS DIRECTED   lisinopril (PRINIVIL,ZESTRIL) 10 MG tablet Take 1 tablet (10 mg total) by mouth daily. (Patient taking differently: 10 mg. Reports taking 12m once daily.)   metFORMIN (GLUCOPHAGE) 1000 MG tablet TAKE ONE TABLET BY MOUTH 2 TIMES A DAY FOR DIABETES   propranolol (INDERAL) 20 MG tablet TAKE 4 TABLETS BY MOUTH TWICE DAILY FOR HEADACHE PREVENTION AND BLOOD PRESSURE   QUEtiapine (SEROQUEL) 25 MG tablet TAKE ONE TABLET BY MOUTH ONCE NIGHTLY.   Rightest GL300 Lancets MISC use as directed   spironolactone (ALDACTONE) 25 MG tablet Take 25 mg by mouth daily.    tiZANidine (ZANAFLEX) 4 MG tablet Take 1 tablet by mouth three times a day as needed for muscle spasticity   VENTOLIN HFA 108 (90 Base) MCG/ACT inhaler INHALE 2 PUFFS EVERY 4 TO 6 HOURS AS NEEDED FOR WHEEZING.   [DISCONTINUED] albuterol (PROVENTIL HFA;VENTOLIN HFA) 108 (90 Base) MCG/ACT inhaler Inhale 2 puffs into the lungs every 4 (four) hours as needed for wheezing or shortness of breath.   [DISCONTINUED] atorvastatin (LIPITOR) 80 MG tablet Take 80 mg by mouth daily with supper.   [DISCONTINUED] furosemide (LASIX) 40 MG tablet Take 40 mg by mouth daily. As needed   aspirin EC 81 MG tablet Take 81 mg by mouth daily. (Patient not taking: Reported on 06/22/2021)   lisinopril (ZESTRIL) 5 MG tablet Take 1/2 tablet (2.542mtotal) by mouth once daily for kidney protection. (Patient not taking: Reported on 06/22/2021)   NOVOLOG 100 UNIT/ML injection INJECT 45 UNITS UNDER THE SKIN EVERY MORNING. THEN 40 UNITS AT LUNCH. THEN 40 UNITS AT DINNER. (Patient not taking: Reported on 06/22/2021)   omeprazole (PRILOSEC) 20 MG capsule Take 20 mg by mouth daily. Pt takes 20 mg in the  morning and 40 mg at night (Patient not taking: Reported on 06/22/2021)   [DISCONTINUED] gabapentin (NEURONTIN) 400 MG capsule Take 400 mg by mouth 2 (two) times daily. [facility]:  TAKE 3 CAPSULES (1200MG) BY MOUTH EVERY MORNING AND AFTERNOON AND 4 CAPSULES (1600MG) AT BEDTIME.  Pt reports taking 400 mg twice daily and 600 mg in the evening   [DISCONTINUED] insulin aspart (NOVOLOG) 100 UNIT/ML injection Inject 0-20 Units into the skin 3 (three) times daily with meals. (Patient not taking: Reported on 06/22/2021)   [DISCONTINUED] insulin aspart (NOVOLOG) 100 UNIT/ML injection INJECT 45 UNITS  UNDER THE SKIN IN THE MORNING, 40 UNITS AT LUNCH AND 40 UNITS AT DINNER. (Patient not taking: Reported on 06/22/2021)   [DISCONTINUED] insulin glargine (LANTUS) 100 UNIT/ML injection Inject 0.24 mLs (24 Units total) into the skin at bedtime. (Patient not taking: Reported on 06/22/2021)   [DISCONTINUED] metFORMIN (GLUCOPHAGE) 1000 MG tablet Take 1 tablet (1,000 mg total) by mouth 2 (two) times daily with a meal.   [DISCONTINUED] ondansetron (ZOFRAN) 4 MG tablet TAKE ONE TABLET BY MOUTH EVERY 8 HOURS AS NEEDED (Patient not taking: Reported on 06/22/2021)   [DISCONTINUED] predniSONE (DELTASONE) 10 MG tablet TAKE AS DIRECTED. TAKE 6 TABLETS BY MOUTH ONCE DAILY ON DAY 1. THEN DECREASE BY ONE TABLET DAILY UNTIL GONE. (Patient not taking: Reported on 06/22/2021)   [DISCONTINUED] propranolol (INDERAL) 80 MG tablet Take 1 tablet (80 mg total) by mouth 2 (two) times daily.   [DISCONTINUED] spironolactone (ALDACTONE) 25 MG tablet TAKE 0.5 TABLET (12.5 MG TOTAL) BY MOUTH ONCE DAILY.   [DISCONTINUED] tiZANidine (ZANAFLEX) 4 MG tablet Take 4 mg by mouth every 8 (eight) hours as needed for muscle spasms.    No facility-administered encounter medications on file as of 06/22/2021.     Assessment and Plan: Diabetes/Neuropathy: Patient reports no longer taking Novolog since sugars range between 120-160 and are checked  regularly 3 times daily. Last Alc was 9.4% back on 09/18/19 and has not had an appointment with PCP in a while. Patient is still compliant with metformin and Tresiba regimen however, reports taking between 120-160 units of Tresiba depending on sugar level. Patient does report numbess in both legs and blurry vision of which is maintained with gabapentin and marijuana. Patient also reports episodes of polyuria and polydipsia. Counseled patient on the signs and symptoms of high blood sugar. Encouraged the importance of getting into see the PCP as soon as possible to get glucose levels and regimen evaluated, maintaining medication compliance, checking sugars regularly and healthy eating/exercising. Hypertension/HF: Patient reports being compliant to spironolactone, propranolol and furosemide. However, patient reports taking lisinopril 40m instead of 2.578m Patient rarely checks blood pressure but reports that it sometimes runs high but couldn't provide a range. Blood pressure at time of visit was 102/72. Patient reports has not taken aspirin in a long time but needs to be on it per PCP. Counseled patient to continue to monitor blood pressure until PCP appointment. Depression/Anxiety: Patient reports being compliant on Seroquel, Buspar, and Cymbalta however, reports being anxious this morning. Reports using his dog as a stress relieving outlet and encouraged patient to continue this positive behavior. Hyperlipidemia: Patient reports being compliant to atorvastatin however last LDL on 09/18/19 was 57. Awaiting PCP follow up appointment. Migraines: Patient reports taking propranolol as prescribed and Excedrin migraine when needed. Asthma: Patient reports using albuterol as needed. GERD: Patient reports taking omeprazole as long time ago and needs a refill on this since it seems to be flaring up after eating recently. Encouraged patient to follow up with this at PCP appointment.  Recommended patient to call PCP today  to schedule an appointment and I would assist in calling PCP to assure an upcoming appointment is established.  Return to clinic 6 months.  AdSinclair ShipPharmD [facility] Phone: [phone]/10/10

## 2021-06-23 ENCOUNTER — Other Ambulatory Visit: Payer: Self-pay

## 2021-07-04 ENCOUNTER — Other Ambulatory Visit: Payer: Self-pay

## 2021-07-06 ENCOUNTER — Other Ambulatory Visit: Payer: Self-pay

## 2021-07-06 MED ORDER — GABAPENTIN 400 MG PO CAPS
ORAL_CAPSULE | ORAL | 5 refills | Status: AC
  Filled 2021-07-06: qty 300, 30d supply, fill #0
  Filled 2021-08-03: qty 300, 30d supply, fill #1
  Filled 2021-09-14: qty 300, 30d supply, fill #2
  Filled 2021-09-15: qty 158, 16d supply, fill #2
  Filled 2021-09-29: qty 300, 30d supply, fill #3
  Filled 2021-11-01: qty 130, 13d supply, fill #4

## 2021-07-07 ENCOUNTER — Other Ambulatory Visit: Payer: Self-pay

## 2021-07-14 ENCOUNTER — Other Ambulatory Visit: Payer: Self-pay

## 2021-07-18 IMAGING — US US ABDOMEN LIMITED RUQ/ASCITES
1 series · 14 of 25 positions shown · non-contrast
Comparison: CT 05/06/2017

CLINICAL DATA: Elevated LFTs.

EXAM:
ULTRASOUND ABDOMEN LIMITED RIGHT UPPER QUADRANT

[Series 1: us abdomen limited ruq (liver/gb) · 14 of 40 slices shown]
[im 1/40]
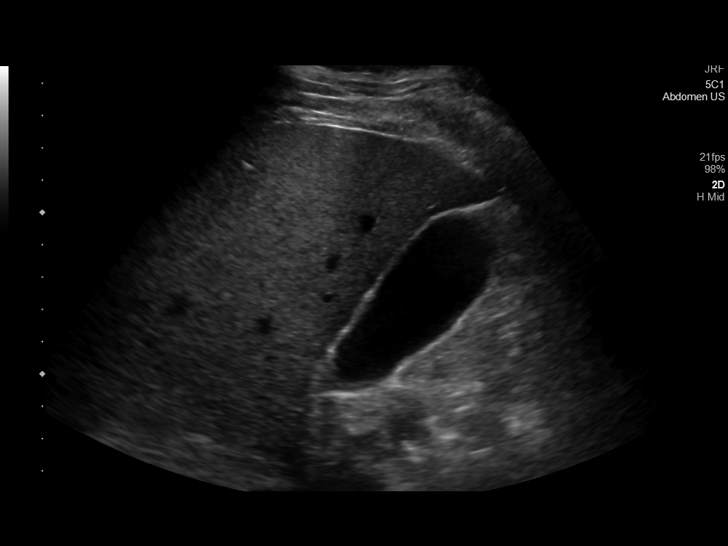
[im 4/40]
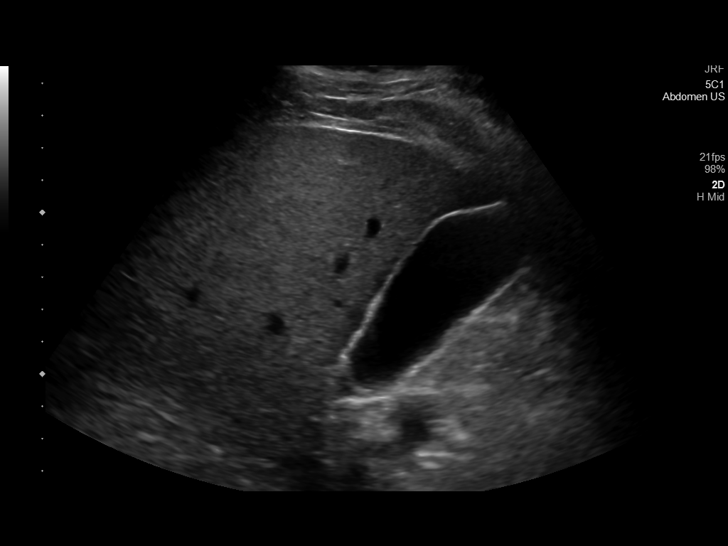
[im 7/40]
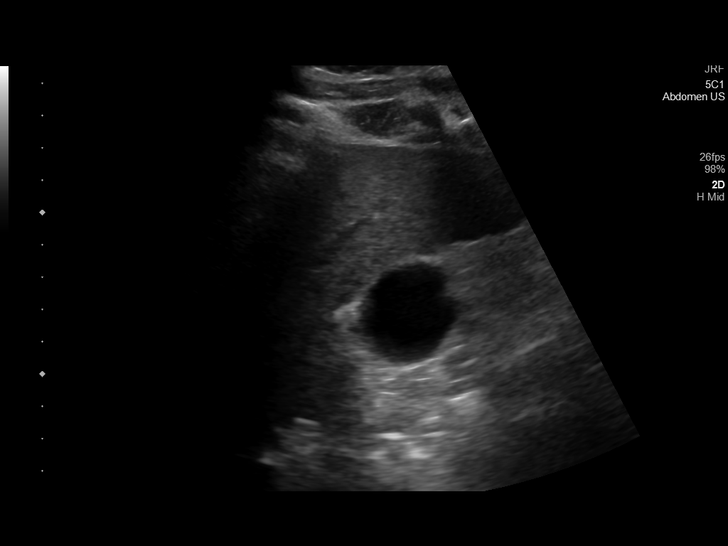
[im 10/40]
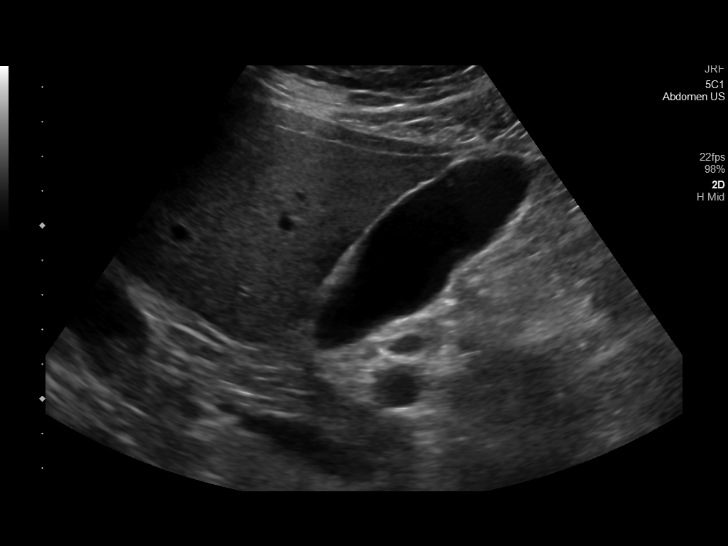
[im 14/40]
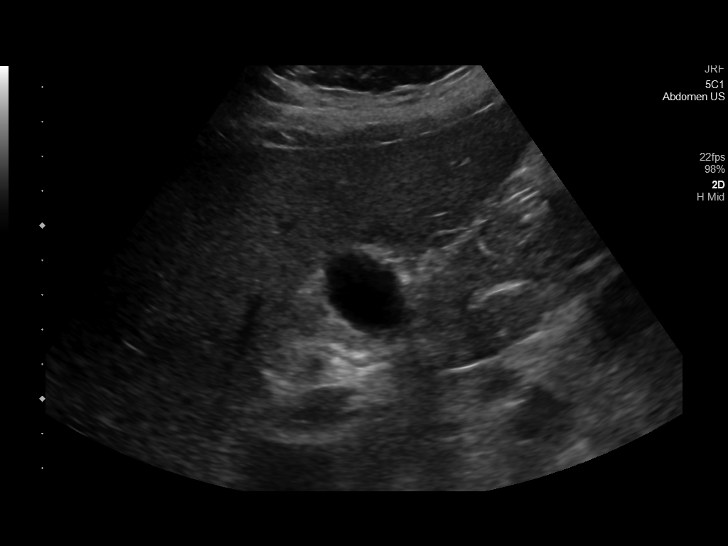
[im 15/40]
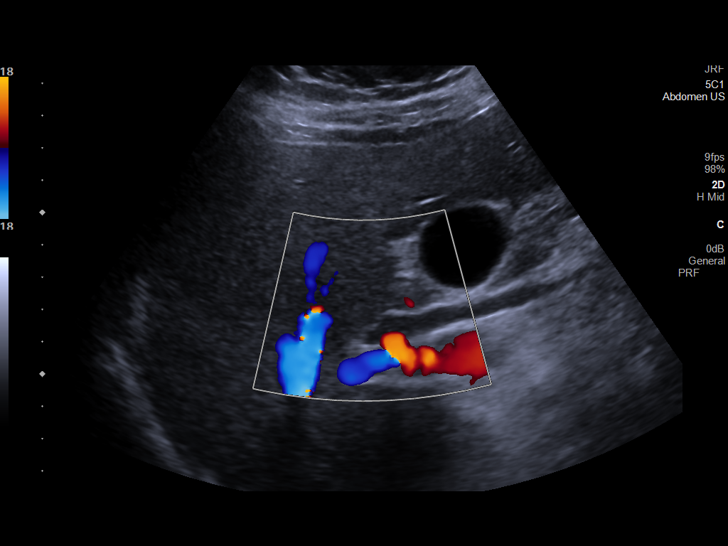
[im 18/40]
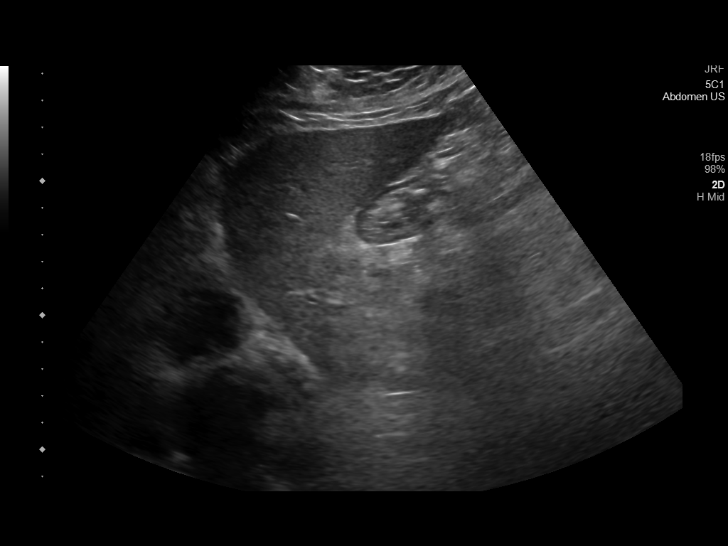
[im 22/40]
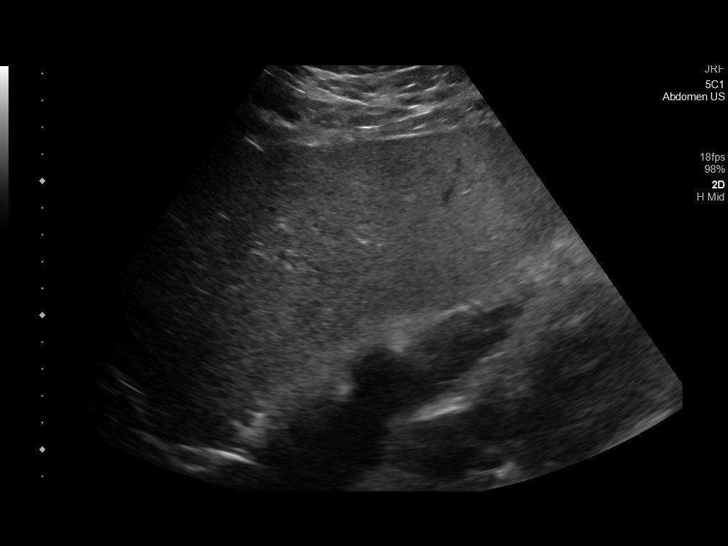
[im 25/40]
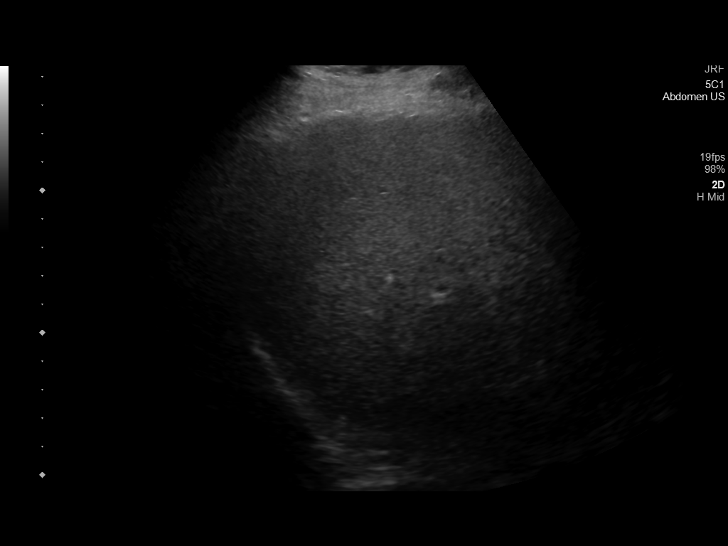
[im 27/40]
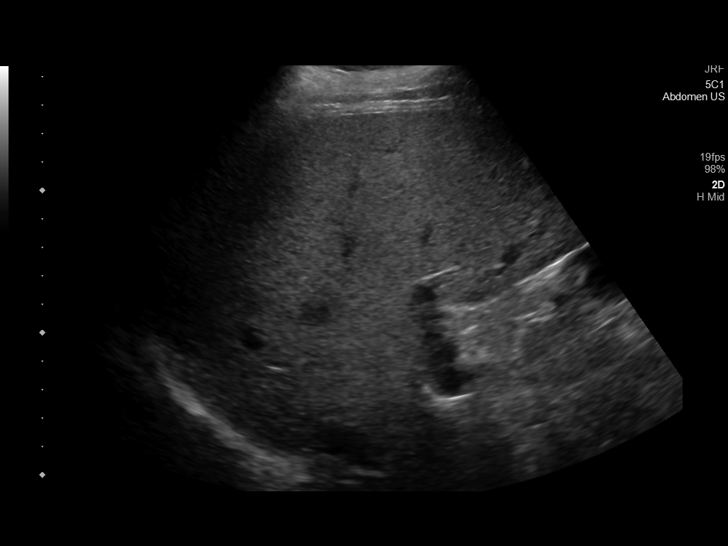
[im 30/40]
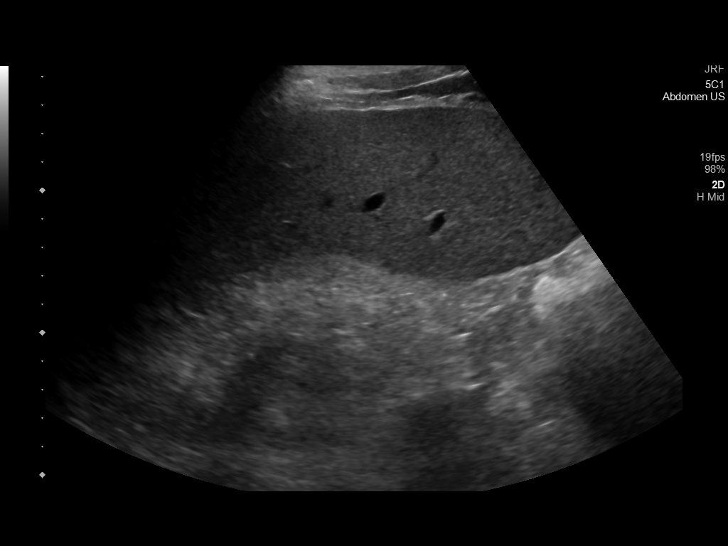
[im 33/40]
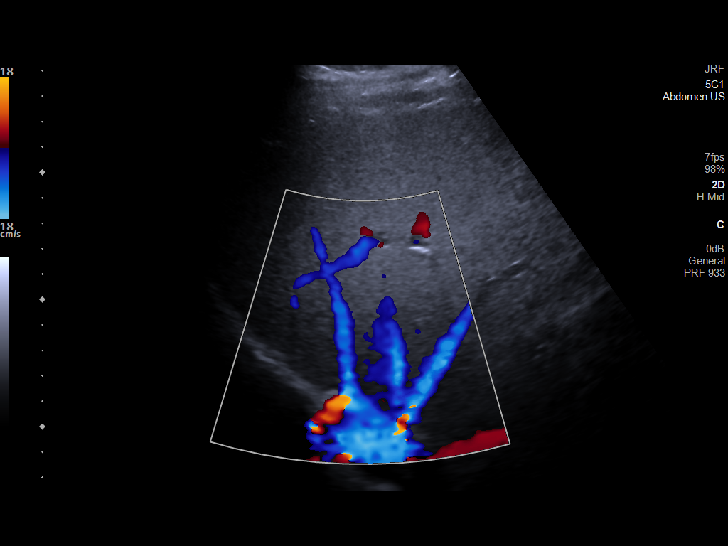
[im 36/40]
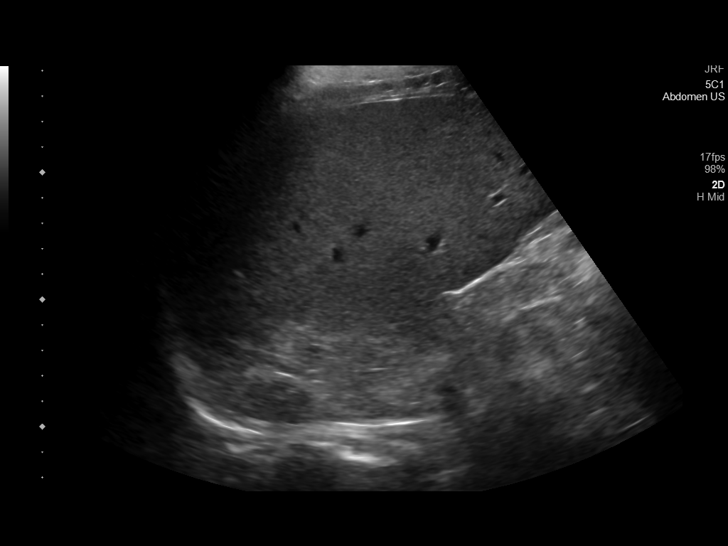
[im 40/40]
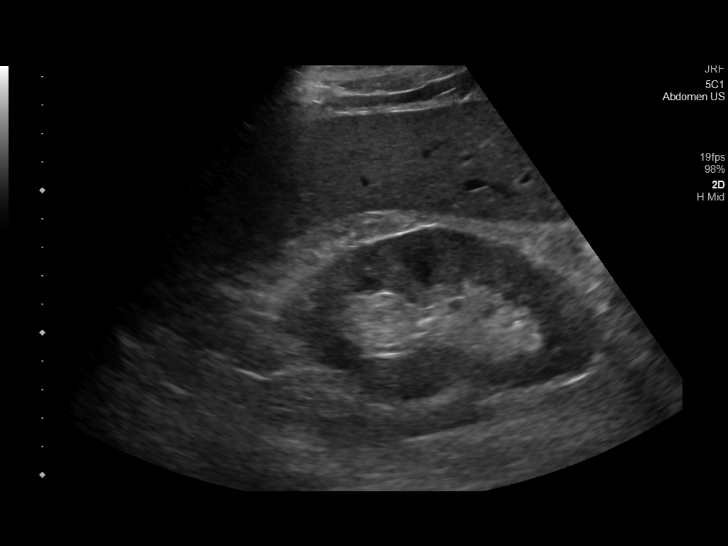

[14 of 25 positions shown; findings below may reference images not displayed]

FINDINGS: Gallbladder:

Physiologically distended. No gallstones or wall thickening
visualized. No pericholecystic fluid. No sonographic Murphy sign
noted by sonographer.

Common bile duct:

Diameter: 3-4 mm proximally, flaring distally to 7 mm. No visualized
choledocholithiasis.

Liver:

No focal lesion identified. Heterogeneous and mildly increased in
parenchymal echogenicity compared to right kidney. Portal vein is
patent on color Doppler imaging with normal direction of blood flow
towards the liver.

Other: No right upper quadrant ascites.
IMPRESSION: 1. Minimal hepatic steatosis.  No focal hepatic lesion.
2. Normal sonographic appearance of the gallbladder. Normal common
bile duct at the porta hepatis, which flares distally but no
evidence of biliary obstruction.

## 2021-07-19 ENCOUNTER — Other Ambulatory Visit: Payer: Self-pay

## 2021-07-19 MED ORDER — JARDIANCE 25 MG PO TABS: ORAL_TABLET | ORAL | 3 refills | Status: DC

## 2021-07-19 MED ORDER — TIZANIDINE HCL 4 MG PO TABS
ORAL_TABLET | ORAL | 1 refills | Status: AC
  Filled 2021-08-01: qty 120, 30d supply, fill #0
  Filled 2021-08-29: qty 120, 30d supply, fill #1
  Filled 2021-09-29: qty 120, 30d supply, fill #2
  Filled 2021-10-31: qty 120, 30d supply, fill #3

## 2021-07-19 MED ORDER — LISINOPRIL 5 MG PO TABS
ORAL_TABLET | ORAL | 3 refills | Status: DC
  Filled 2021-07-19: qty 45, 90d supply, fill #0
  Filled 2021-10-14: qty 45, 90d supply, fill #1

## 2021-07-19 MED ORDER — ASPIRIN 81 MG PO TBEC
DELAYED_RELEASE_TABLET | ORAL | 3 refills | Status: AC
  Filled 2021-07-19: qty 90, 90d supply, fill #0
  Filled 2021-10-14: qty 90, 90d supply, fill #1

## 2021-07-19 MED ORDER — FUROSEMIDE 20 MG PO TABS
ORAL_TABLET | ORAL | 1 refills | Status: DC
  Filled 2021-07-19: qty 90, 90d supply, fill #0
  Filled 2021-10-14: qty 90, 90d supply, fill #1

## 2021-07-19 MED ORDER — OMEPRAZOLE 20 MG PO CPDR
DELAYED_RELEASE_CAPSULE | ORAL | 3 refills | Status: DC
  Filled 2021-07-19: qty 21, 21d supply, fill #0
  Filled 2021-08-29: qty 30, 30d supply, fill #1
  Filled 2021-09-29: qty 30, 30d supply, fill #2
  Filled 2021-10-31: qty 30, 30d supply, fill #3

## 2021-07-19 MED ORDER — DULOXETINE HCL 30 MG PO CPEP
ORAL_CAPSULE | ORAL | 1 refills | Status: AC
  Filled 2021-07-19: qty 90, 30d supply, fill #0
  Filled 2021-08-29: qty 90, 30d supply, fill #1
  Filled 2021-09-29: qty 90, 30d supply, fill #2
  Filled 2021-10-31: qty 90, 30d supply, fill #3

## 2021-07-20 ENCOUNTER — Other Ambulatory Visit: Payer: Self-pay

## 2021-07-20 MED ORDER — BUSPIRONE HCL 15 MG PO TABS
ORAL_TABLET | ORAL | 11 refills | Status: AC
  Filled 2021-07-20: qty 60, 30d supply, fill #0
  Filled 2021-08-29: qty 60, 30d supply, fill #1
  Filled 2021-09-29: qty 38, 19d supply, fill #2
  Filled 2021-10-14: qty 60, 30d supply, fill #3

## 2021-07-21 ENCOUNTER — Other Ambulatory Visit: Payer: Self-pay

## 2021-07-25 ENCOUNTER — Telehealth: Payer: Self-pay | Admitting: Pharmacist

## 2021-07-25 NOTE — Telephone Encounter (Signed)
07/25/2021 10:23:12 AM - [place] [employer] forms to dr & patient -- [name] - Monday, July 25, 2021 10:20 AM --  Received printout from pharmacy for Jardiance 25mg . Forms mailed to provider & Patient for signatures. Also mailed 2023 recertification application to complete. Requesting POI & tax information.

## 2021-07-27 ENCOUNTER — Other Ambulatory Visit: Payer: Self-pay

## 2021-08-01 ENCOUNTER — Other Ambulatory Visit: Payer: Self-pay

## 2021-08-01 ENCOUNTER — Telehealth: Payer: Self-pay | Admitting: Pharmacist

## 2021-08-01 NOTE — Telephone Encounter (Signed)
08/01/2021 11:24:46 AM - [facility] to [employer] Refill -- Arletha Pili - Monday, August 01, 2021 11:23 AM --  [facility] faxed refill request to [facility].

## 2021-08-03 ENCOUNTER — Other Ambulatory Visit: Payer: Self-pay

## 2021-08-03 MED ORDER — SPIRONOLACTONE 25 MG PO TABS
12.5000 mg | ORAL_TABLET | Freq: Every day | ORAL | 3 refills | Status: DC
  Filled 2021-08-03: qty 45, 90d supply, fill #0

## 2021-08-04 ENCOUNTER — Other Ambulatory Visit: Payer: Self-pay

## 2021-08-08 ENCOUNTER — Other Ambulatory Visit: Payer: Self-pay

## 2021-08-12 ENCOUNTER — Other Ambulatory Visit: Payer: Self-pay

## 2021-08-14 MED ORDER — ALBUTEROL SULFATE HFA 108 (90 BASE) MCG/ACT IN AERS
INHALATION_SPRAY | RESPIRATORY_TRACT | 5 refills | Status: DC
  Filled 2021-08-14: qty 25.5, fill #0

## 2021-08-15 ENCOUNTER — Other Ambulatory Visit: Payer: Self-pay

## 2021-08-17 ENCOUNTER — Telehealth: Payer: Self-pay | Admitting: Pharmacist

## 2021-08-17 ENCOUNTER — Other Ambulatory Visit: Payer: Self-pay

## 2021-08-17 NOTE — Telephone Encounter (Signed)
08/17/2021 2:54:56 PM - [facility] Rea pending ?-- Arletha Pili - Wednesday, August 17, 2021 2:53 PM --  ?Received dr. signed portion for Jardiance 25mg . Holding for pt. signed portion. ?

## 2021-08-22 ENCOUNTER — Telehealth: Payer: Self-pay | Admitting: Pharmacist

## 2021-08-22 NOTE — Telephone Encounter (Signed)
08/22/2021 9:26:32 AM - [place] [employer] faxed to [employer] ?-- [name] - Monday, August 22, 2021 9:20 AM -- ?Faxed [employer] PAP renewal for Jardiance 25mg .  ? ?

## 2021-08-24 ENCOUNTER — Telehealth: Payer: Self-pay | Admitting: Pharmacy Technician

## 2021-08-24 NOTE — Telephone Encounter (Signed)
Received updated proof of income.  Patient eligible to receive medication assistance at [facility] until time for re-certification in 5790, and as long as eligibility requirements continue to be met. ? ?Jacquelynn Cree ?Care Manager ?[facility]  ?

## 2021-08-29 ENCOUNTER — Other Ambulatory Visit: Payer: Self-pay

## 2021-08-30 ENCOUNTER — Other Ambulatory Visit: Payer: Self-pay

## 2021-08-30 MED ORDER — PROPRANOLOL HCL 20 MG PO TABS
ORAL_TABLET | ORAL | 5 refills | Status: DC
  Filled 2021-08-30: qty 240, 30d supply, fill #0
  Filled 2021-10-14: qty 240, 30d supply, fill #1

## 2021-09-14 ENCOUNTER — Other Ambulatory Visit: Payer: Self-pay

## 2021-09-15 ENCOUNTER — Other Ambulatory Visit: Payer: Self-pay

## 2021-09-20 ENCOUNTER — Other Ambulatory Visit: Payer: Self-pay

## 2021-09-29 ENCOUNTER — Other Ambulatory Visit: Payer: Self-pay

## 2021-10-03 ENCOUNTER — Other Ambulatory Visit: Payer: Self-pay

## 2021-10-06 ENCOUNTER — Other Ambulatory Visit: Payer: Self-pay

## 2021-10-13 ENCOUNTER — Other Ambulatory Visit: Payer: Self-pay

## 2021-10-14 ENCOUNTER — Other Ambulatory Visit: Payer: Self-pay

## 2021-10-21 ENCOUNTER — Other Ambulatory Visit: Payer: Self-pay

## 2021-10-31 ENCOUNTER — Other Ambulatory Visit: Payer: Self-pay

## 2021-11-01 ENCOUNTER — Other Ambulatory Visit: Payer: Self-pay

## 2021-11-09 ENCOUNTER — Other Ambulatory Visit: Payer: Self-pay

## 2021-12-02 ENCOUNTER — Encounter: Payer: Self-pay | Admitting: Pharmacy Technician

## 2021-12-02 NOTE — Patient Outreach (Signed)
Patient is seeing Dr. Karie Fetch, who is not a [facility] provider.  Unable to provide medication assistance.  Patient is aware.  Sherilyn Dacosta Patient Advocate Specialist [facility]

## 2021-12-06 IMAGING — CR DG CHEST 2V
1 series · 2 of 2 positions shown · non-contrast
Comparison: June 10, 2018.

CLINICAL DATA: Chest pain.

EXAM:
CHEST - 2 VIEW

[Series 1: dg chest 2 view · 0.14mm/px · 2 of 2 slices shown]
[im 1/2]
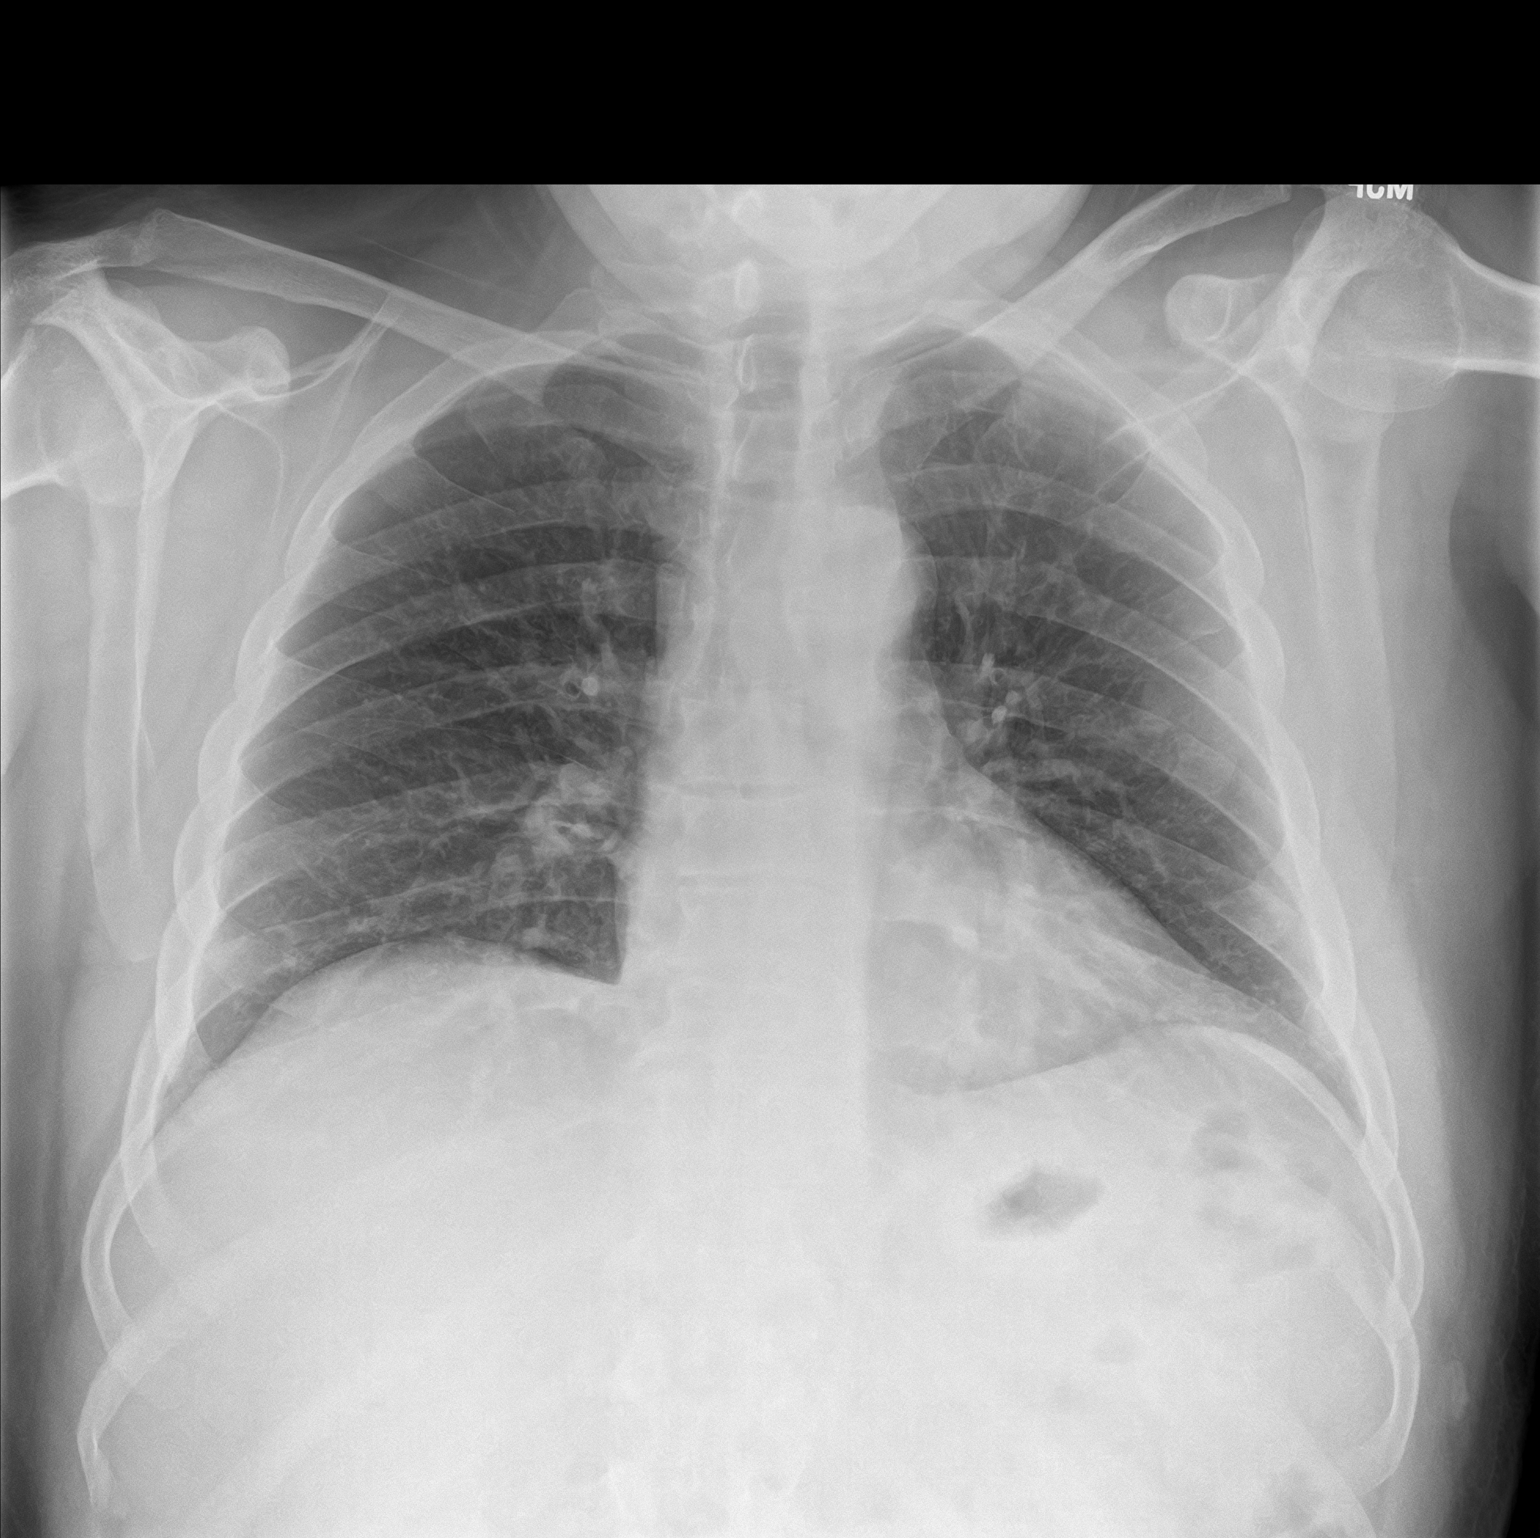
[im 2/2]
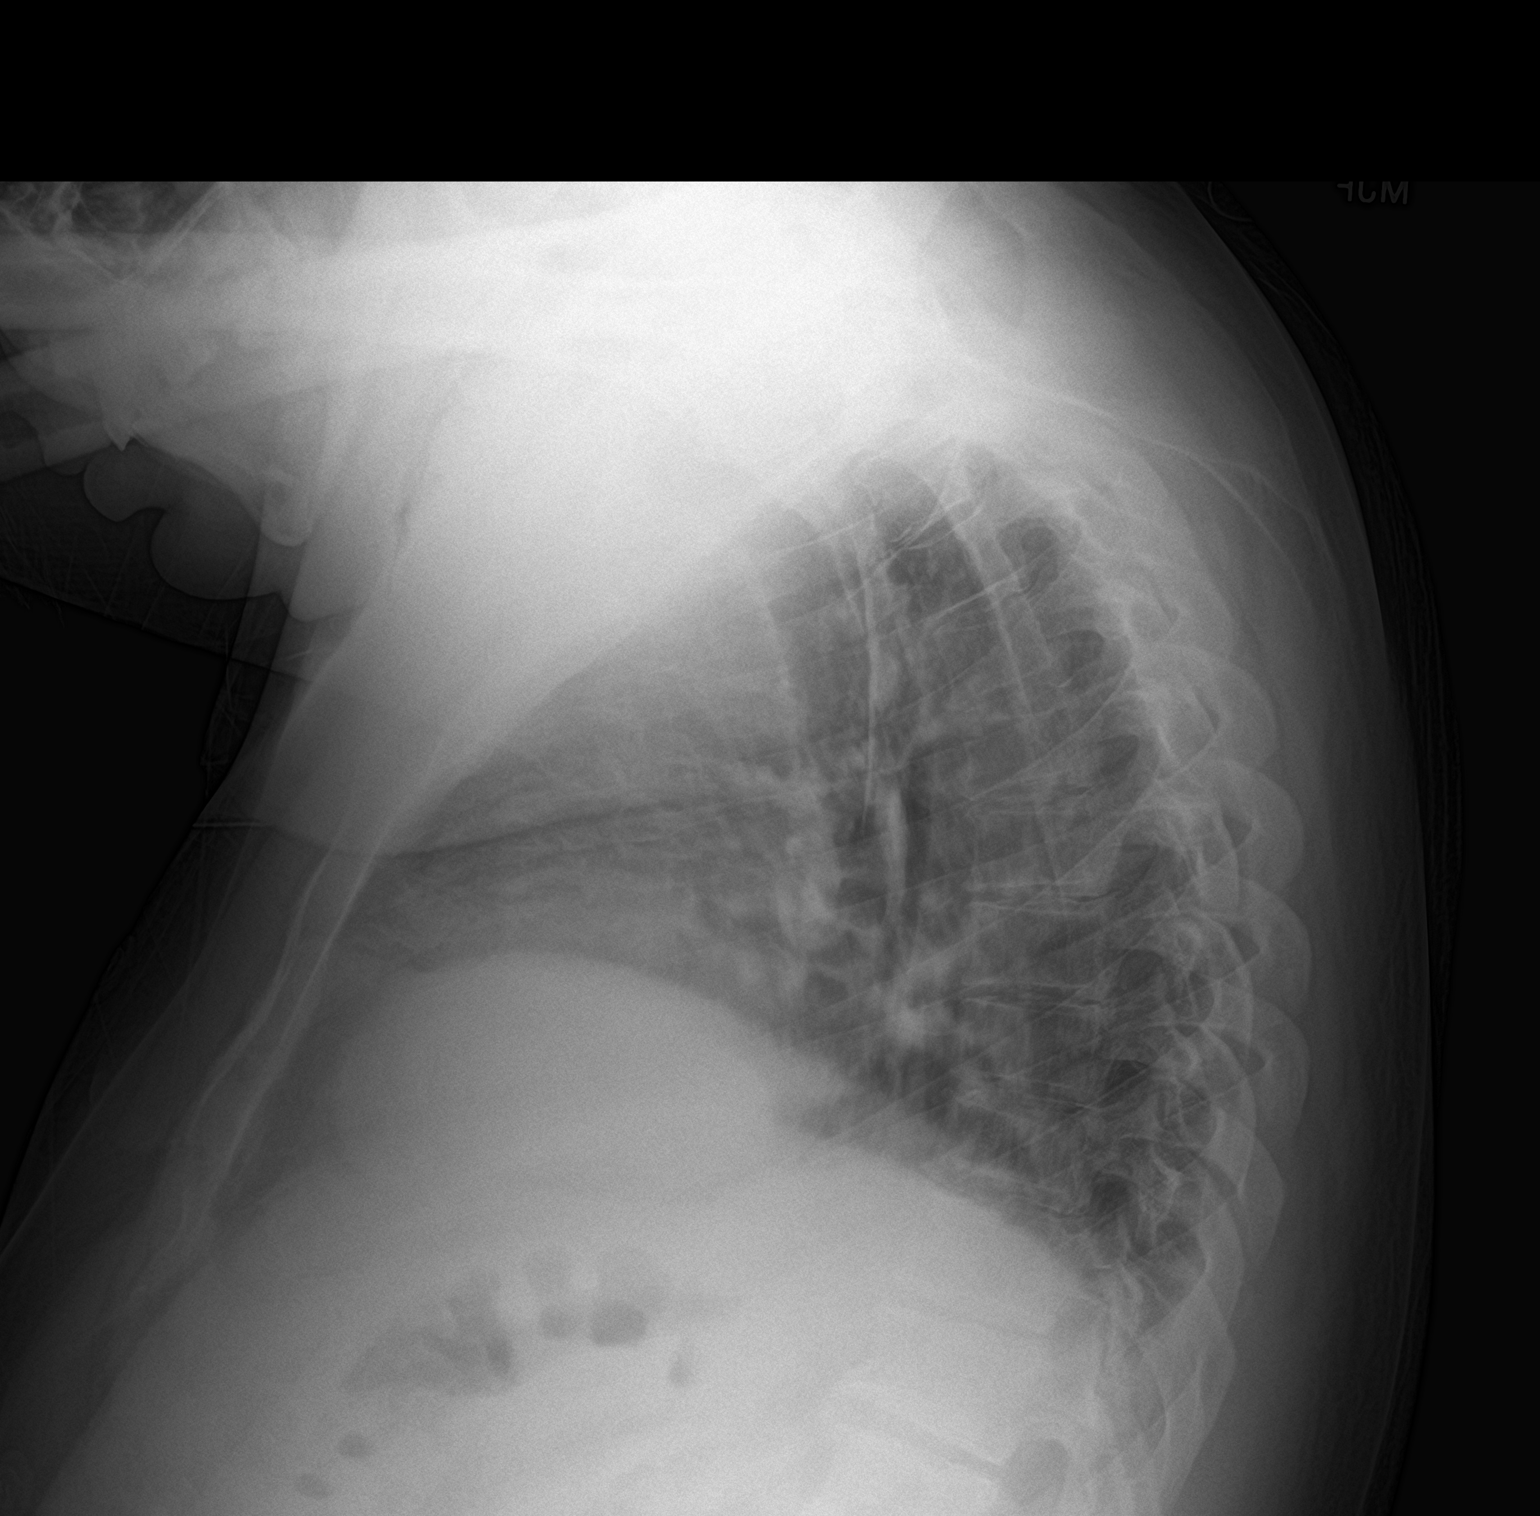

[2 of 2 positions shown; findings below may reference images not displayed]

FINDINGS: The heart size and mediastinal contours are within normal limits.
Both lungs are clear. The visualized skeletal structures are
unremarkable.
IMPRESSION: No active cardiopulmonary disease.

## 2021-12-16 ENCOUNTER — Other Ambulatory Visit: Payer: Self-pay

## 2022-01-09 ENCOUNTER — Other Ambulatory Visit: Payer: Self-pay

## 2022-02-07 ENCOUNTER — Other Ambulatory Visit: Payer: Self-pay

## 2022-11-16 ENCOUNTER — Other Ambulatory Visit: Payer: Self-pay

## 2022-11-30 ENCOUNTER — Emergency Department
Admission: EM | Admit: 2022-11-30 | Discharge: 2022-11-30 | Disposition: A | Discharge status: Home / Self Care | Payer: Self-pay | Attending: Emergency Medicine | Admitting: Emergency Medicine

## 2022-11-30 ENCOUNTER — Other Ambulatory Visit: Payer: Self-pay

## 2022-11-30 ENCOUNTER — Emergency Department: Payer: Medicaid Other

## 2022-11-30 ENCOUNTER — Encounter: Payer: Self-pay | Admitting: Emergency Medicine

## 2022-11-30 DIAGNOSIS — E860 Dehydration: Secondary | ICD-10-CM | POA: Insufficient documentation

## 2022-11-30 DIAGNOSIS — E876 Hypokalemia: Secondary | ICD-10-CM | POA: Insufficient documentation

## 2022-11-30 DIAGNOSIS — R1030 Lower abdominal pain, unspecified: Secondary | ICD-10-CM | POA: Insufficient documentation

## 2022-11-30 DIAGNOSIS — I509 Heart failure, unspecified: Secondary | ICD-10-CM | POA: Insufficient documentation

## 2022-11-30 DIAGNOSIS — I110 Hypertensive heart disease with heart failure: Secondary | ICD-10-CM | POA: Insufficient documentation

## 2022-11-30 DIAGNOSIS — E871 Hypo-osmolality and hyponatremia: Secondary | ICD-10-CM | POA: Insufficient documentation

## 2022-11-30 DIAGNOSIS — R739 Hyperglycemia, unspecified: Secondary | ICD-10-CM

## 2022-11-30 DIAGNOSIS — E1165 Type 2 diabetes mellitus with hyperglycemia: Secondary | ICD-10-CM | POA: Insufficient documentation

## 2022-11-30 LAB — URINALYSIS, ROUTINE W REFLEX MICROSCOPIC
Bacteria, UA: NONE SEEN
Bilirubin Urine: NEGATIVE
Glucose, UA: >=500 mg/dL — AB
Hgb urine dipstick: NEGATIVE
Ketones, ur: NEGATIVE mg/dL
Nitrite: NEGATIVE
Protein, ur: NEGATIVE mg/dL
Specific Gravity, Urine: 1.025 (ref 1.005–1.030)
pH: 5 (ref 5.0–8.0)

## 2022-11-30 LAB — LACTIC ACID, PLASMA
Lactic Acid, Venous: 3 mmol/L (ref 0.5–1.9)
Lactic Acid, Venous: 2.5 mmol/L (ref 0.5–1.9)

## 2022-11-30 LAB — TROPONIN I (HIGH SENSITIVITY): Troponin I (High Sensitivity): 8 ng/L (ref <18)

## 2022-11-30 LAB — BASIC METABOLIC PANEL
Anion gap: 16 — ABNORMAL HIGH (ref 5–15)
Anion gap: 14 (ref 5–15)
BUN: 19 mg/dL (ref 6–20)
BUN: 18 mg/dL (ref 6–20)
CO2: 22 mmol/L (ref 22–32)
CO2: 22 mmol/L (ref 22–32)
Calcium: 9.7 mg/dL (ref 8.9–10.3)
Calcium: 8.6 mg/dL — ABNORMAL LOW (ref 8.9–10.3)
Chloride: 88 mmol/L — ABNORMAL LOW (ref 98–111)
Chloride: 94 mmol/L — ABNORMAL LOW (ref 98–111)
Creatinine, Ser: 1.66 mg/dL — ABNORMAL HIGH (ref 0.61–1.24)
Creatinine, Ser: 1.42 mg/dL — ABNORMAL HIGH (ref 0.61–1.24)
GFR, Estimated: 49 mL/min — ABNORMAL LOW (ref >60)
GFR, Estimated: 59 mL/min — ABNORMAL LOW (ref >60)
Glucose, Bld: 351 mg/dL — ABNORMAL HIGH (ref 70–99)
Glucose, Bld: 92 mg/dL (ref 70–99)
Potassium: 3.1 mmol/L — ABNORMAL LOW (ref 3.5–5.1)
Potassium: 3 mmol/L — ABNORMAL LOW (ref 3.5–5.1)
Sodium: 126 mmol/L — ABNORMAL LOW (ref 135–145)
Sodium: 130 mmol/L — ABNORMAL LOW (ref 135–145)

## 2022-11-30 LAB — CBC
HCT: 46.6 % (ref 39.0–52.0)
Hemoglobin: 16.6 g/dL (ref 13.0–17.0)
MCH: 31.7 pg (ref 26.0–34.0)
MCHC: 35.6 g/dL (ref 30.0–36.0)
MCV: 88.9 fL (ref 80.0–100.0)
Platelets: 245 10*3/uL (ref 150–400)
RBC: 5.24 MIL/uL (ref 4.22–5.81)
RDW: 12.1 % (ref 11.5–15.5)
WBC: 17.2 10*3/uL — ABNORMAL HIGH (ref 4.0–10.5)
nRBC: 0 % (ref 0.0–0.2)

## 2022-11-30 LAB — CBG MONITORING, ED: Glucose-Capillary: 247 mg/dL — ABNORMAL HIGH (ref 70–99)

## 2022-11-30 MED ORDER — IBUPROFEN 600 MG PO TABS
600.0000 mg | ORAL_TABLET | Freq: Once | ORAL | Status: AC
  Administered 2022-11-30: 600 mg via ORAL
  Filled 2022-11-30: qty 1

## 2022-11-30 MED ORDER — SODIUM CHLORIDE 0.9 % IV BOLUS
1000.0000 mL | Freq: Once | INTRAVENOUS | Status: AC
  Administered 2022-11-30: 1000 mL via INTRAVENOUS

## 2022-11-30 MED ORDER — SODIUM CHLORIDE 0.9 % IV BOLUS
500.0000 mL | Freq: Once | INTRAVENOUS | Status: AC
  Administered 2022-11-30: 500 mL via INTRAVENOUS

## 2022-11-30 MED ORDER — ONDANSETRON HCL 4 MG/2ML IJ SOLN
4.0000 mg | Freq: Once | INTRAMUSCULAR | Status: AC
  Administered 2022-11-30: 4 mg via INTRAVENOUS
  Filled 2022-11-30: qty 2

## 2022-11-30 MED ORDER — ACETAMINOPHEN 500 MG PO TABS
1000.0000 mg | ORAL_TABLET | Freq: Once | ORAL | Status: AC
  Administered 2022-11-30: 1000 mg via ORAL
  Filled 2022-11-30: qty 2

## 2022-11-30 MED ORDER — INSULIN ASPART 100 UNIT/ML IJ SOLN
5.0000 [IU] | Freq: Once | INTRAMUSCULAR | Status: AC
  Administered 2022-11-30: 5 [IU] via INTRAVENOUS
  Filled 2022-11-30: qty 1

## 2022-11-30 NOTE — ED Triage Notes (Signed)
Patient to ED via POV for generalized weakness. States he has fallen 2 since yesterday. Have fever at home with episodes of vomiting. States he hit head on table. Hx of diabetes.

## 2022-11-30 NOTE — Discharge Instructions (Addendum)
Make sure to drink plenty of fluids and take your insulin as prescribed as well as use all meals as scheduled.  Drink Pedialyte or similar electrolyte rehydration formula given that your potassium was slightly low.  Please see your doctor this week for a follow-up appointment for blood sugar management, and to discuss wound care for your foot wound.  If you have any new, worsening or unexpected symptoms call your doctor right away or come back to the emergency department.

## 2022-11-30 NOTE — ED Provider Notes (Signed)
[facility] Provider Note    Event Date/Time   First MD Initiated Contact with Patient 11/30/22 1251     (approximate)   History   Weakness   HPI  [facility] is a 52 y.o. male   Past medical history of CHF, diabetes, GERD, anxiety, hypertension hyperlipidemia presents emergency department with several days of nausea, vomiting, polydipsia, polyuria, dysuria, right flank pain radiating to the groin, and lightheadedness upon standing.  Feels dehydrated.  Occasionally noncompliant with insulin.  Has an injury on the left toe that has not healed in several weeks.  Denies fevers, chills, chest pain or respiratory complaints.  He felt lightheaded several times upon standing last couple of days and 1 time had a fall with head strike no loss of consciousness.  Independent Historian contributed to assessment above: His daughter is at bedside corroborates information given above.       Physical Exam   Triage Vital Signs: ED Triage Vitals  Enc Vitals Group     BP 11/30/22 1232 (!) 75/55     Pulse Rate 11/30/22 1232 78     Resp 11/30/22 1232 18     Temp 11/30/22 1232 98.3 F (36.8 C)     Temp Source 11/30/22 1232 Oral     SpO2 11/30/22 1232 96 %     Weight --      Height --      Head Circumference --      Peak Flow --      Pain Score 11/30/22 1230 0     Pain Loc --      Pain Edu? --      Excl. in GC? --     Most recent vital signs: Vitals:   11/30/22 1530 11/30/22 1600  BP: 104/81   Pulse: 74 71  Resp: 19   Temp:    SpO2: 100% 97%    General: Awake, no distress.  CV:  Good peripheral perfusion.  Resp:  Normal effort.  Abd:  No distention.  Other:  Initially hypotensive appears clinically dehydrated with dry mucous membranes and poor skin turgor.  Soft nontender abdomen.  Clear lungs.  Mentation is normal awake alert oriented.  Perineum appears normal with no signs of infection   ED Results / Procedures / Treatments   Labs (all  labs ordered are listed, but only abnormal results are displayed) Labs Reviewed  BASIC METABOLIC PANEL - Abnormal; Notable for the following components:      Result Value   Sodium 126 (*)    Potassium 3.1 (*)    Chloride 88 (*)    Glucose, Bld 351 (*)    Creatinine, Ser 1.66 (*)    GFR, Estimated 49 (*)    Anion gap 16 (*)    All other components within normal limits  CBC - Abnormal; Notable for the following components:   WBC 17.2 (*)    All other components within normal limits  URINALYSIS, ROUTINE W REFLEX MICROSCOPIC - Abnormal; Notable for the following components:   Color, Urine YELLOW (*)    APPearance HAZY (*)    Glucose, UA >=500 (*)    Leukocytes,Ua TRACE (*)    All other components within normal limits  LACTIC ACID, PLASMA - Abnormal; Notable for the following components:   Lactic Acid, Venous 3.0 (*)    All other components within normal limits  LACTIC ACID, PLASMA - Abnormal; Notable for the following components:   Lactic Acid, Venous 2.5 (*)  All other components within normal limits  BASIC METABOLIC PANEL - Abnormal; Notable for the following components:   Sodium 130 (*)    Potassium 3.0 (*)    Chloride 94 (*)    Creatinine, Ser 1.42 (*)    Calcium 8.6 (*)    GFR, Estimated 59 (*)    All other components within normal limits  CBG MONITORING, ED - Abnormal; Notable for the following components:   Glucose-Capillary 247 (*)    All other components within normal limits  CULTURE, BLOOD (SINGLE)  TROPONIN I (HIGH SENSITIVITY)     I ordered and reviewed the above labs they are notable for hyponatremia but corrected for elevated glucose is normal, glucose 350 with a very mildly elevated anion gap of 16 normal bicarb.  EKG  ED ECG REPORT I, Pilar Jarvis, the attending physician, personally viewed and interpreted this ECG.   Date: 11/30/2022  EKG Time: 1238  Rate: 83  Rhythm: nsr  Axis: nl  Intervals:none  ST&T Change: no stemi    RADIOLOGY I  independently reviewed and interpreted CT of the head see no obvious bleeding or midline shift   PROCEDURES:  Critical Care performed: No  Procedures   MEDICATIONS ORDERED IN ED: Medications  sodium chloride 0.9 % bolus 500 mL (0 mLs Intravenous Stopped 11/30/22 1331)  ondansetron (ZOFRAN) injection 4 mg (4 mg Intravenous Given 11/30/22 1317)  sodium chloride 0.9 % bolus 500 mL (0 mLs Intravenous Stopped 11/30/22 1433)  insulin aspart (novoLOG) injection 5 Units (5 Units Intravenous Given 11/30/22 1330)  sodium chloride 0.9 % bolus 1,000 mL (0 mLs Intravenous Stopped 11/30/22 1604)  acetaminophen (TYLENOL) tablet 1,000 mg (1,000 mg Oral Given 11/30/22 1558)  ibuprofen (ADVIL) tablet 600 mg (600 mg Oral Given 11/30/22 1558)    IMPRESSION / MDM / ASSESSMENT AND PLAN / ED COURSE  I reviewed the triage vital signs and the nursing notes.                                Patient's presentation is most consistent with acute presentation with potential threat to life or bodily function.  Differential diagnosis includes, but is not limited to, DKA, dehydration, hyperglycemia, infection, kidney stone, urinary tract infection, nec fasc or cellulitis   The patient is on the cardiac monitor to evaluate for evidence of arrhythmia and/or significant heart rate changes.  MDM:    Hypovolemia appears clinically dehydrated with hyperglycemia polyuria polydipsia.  Does not appear to be in DKA mildly elevated and gap but normal bicarb.  Give fluids, give insulin, recheck BMP which has normalized closed anion gap and normal glucose.  Patient remains awake alert oriented comfortable, and blood pressure has improved with hydration.  Right flank pain radiating to the groin with a history of kidney stones check for kidney stone which is negative on CT renal scan.  Also some pain in the groin but no evidence of infection on my clinical exam to the genitals or perineum.  He has a wound to the left toe that does  not appear infected.  Due to poor renal healing likely due to diabetes I asked him to follow-up with his primary doctor to recheck the wound and referral to podiatry or wound clinic as needed.  Given his unremarkable workup, correction of lab abnormalities, improved blood pressure, patient to be discharged at this time.  He will follow-up with his PMD for recheck labs for hypokalemia, diabetes  management, wound care for his toe.  He understands to return with any new or worsening symptoms.       FINAL CLINICAL IMPRESSION(S) / ED DIAGNOSES   Final diagnoses:  Dehydration  Hyperglycemia  Hypokalemia     Rx / DC Orders   ED Discharge Orders     None        Note:  This document was prepared using Dragon voice recognition software and may include unintentional dictation errors.    Pilar Jarvis, MD 11/30/22 1620

## 2022-12-01 ENCOUNTER — Telehealth: Payer: Self-pay | Admitting: Emergency Medicine

## 2022-12-01 ENCOUNTER — Emergency Department: Payer: Self-pay

## 2022-12-01 ENCOUNTER — Encounter: Payer: Self-pay | Admitting: Emergency Medicine

## 2022-12-01 ENCOUNTER — Inpatient Hospital Stay (HOSPITAL_COMMUNITY)
Admit: 2022-12-01 | Discharge: 2022-12-01 | Disposition: A | Discharge status: Home / Self Care | Payer: Medicaid Other | Attending: Pulmonary Disease | Admitting: Pulmonary Disease

## 2022-12-01 ENCOUNTER — Telehealth (HOSPITAL_COMMUNITY): Payer: Self-pay | Admitting: Pharmacist

## 2022-12-01 ENCOUNTER — Inpatient Hospital Stay
Admission: EM | Admit: 2022-12-01 | Discharge: 2022-12-04 | DRG: 871 | Disposition: A | Discharge status: Home / Self Care | Payer: Self-pay | Attending: Internal Medicine | Admitting: Internal Medicine

## 2022-12-01 DIAGNOSIS — Z807 Family history of other malignant neoplasms of lymphoid, hematopoietic and related tissues: Secondary | ICD-10-CM

## 2022-12-01 DIAGNOSIS — Z87891 Personal history of nicotine dependence: Secondary | ICD-10-CM

## 2022-12-01 DIAGNOSIS — E871 Hypo-osmolality and hyponatremia: Secondary | ICD-10-CM | POA: Diagnosis present

## 2022-12-01 DIAGNOSIS — E1165 Type 2 diabetes mellitus with hyperglycemia: Secondary | ICD-10-CM | POA: Diagnosis present

## 2022-12-01 DIAGNOSIS — N200 Calculus of kidney: Secondary | ICD-10-CM | POA: Diagnosis present

## 2022-12-01 DIAGNOSIS — Z7984 Long term (current) use of oral hypoglycemic drugs: Secondary | ICD-10-CM

## 2022-12-01 DIAGNOSIS — Z560 Unemployment, unspecified: Secondary | ICD-10-CM

## 2022-12-01 DIAGNOSIS — Z79899 Other long term (current) drug therapy: Secondary | ICD-10-CM

## 2022-12-01 DIAGNOSIS — Z7982 Long term (current) use of aspirin: Secondary | ICD-10-CM

## 2022-12-01 DIAGNOSIS — A491 Streptococcal infection, unspecified site: Principal | ICD-10-CM

## 2022-12-01 DIAGNOSIS — A419 Sepsis, unspecified organism: Secondary | ICD-10-CM | POA: Diagnosis present

## 2022-12-01 DIAGNOSIS — L089 Local infection of the skin and subcutaneous tissue, unspecified: Secondary | ICD-10-CM

## 2022-12-01 DIAGNOSIS — Z833 Family history of diabetes mellitus: Secondary | ICD-10-CM

## 2022-12-01 DIAGNOSIS — Z794 Long term (current) use of insulin: Secondary | ICD-10-CM

## 2022-12-01 DIAGNOSIS — I2510 Atherosclerotic heart disease of native coronary artery without angina pectoris: Secondary | ICD-10-CM | POA: Diagnosis present

## 2022-12-01 DIAGNOSIS — I110 Hypertensive heart disease with heart failure: Secondary | ICD-10-CM | POA: Diagnosis present

## 2022-12-01 DIAGNOSIS — E861 Hypovolemia: Secondary | ICD-10-CM | POA: Diagnosis present

## 2022-12-01 DIAGNOSIS — R7881 Bacteremia: Secondary | ICD-10-CM | POA: Diagnosis present

## 2022-12-01 DIAGNOSIS — I5032 Chronic diastolic (congestive) heart failure: Secondary | ICD-10-CM

## 2022-12-01 DIAGNOSIS — F32A Depression, unspecified: Secondary | ICD-10-CM | POA: Diagnosis present

## 2022-12-01 DIAGNOSIS — E860 Dehydration: Secondary | ICD-10-CM | POA: Diagnosis present

## 2022-12-01 DIAGNOSIS — E7800 Pure hypercholesterolemia, unspecified: Secondary | ICD-10-CM | POA: Diagnosis present

## 2022-12-01 DIAGNOSIS — A401 Sepsis due to streptococcus, group B: Principal | ICD-10-CM

## 2022-12-01 DIAGNOSIS — Z1152 Encounter for screening for COVID-19: Secondary | ICD-10-CM

## 2022-12-01 DIAGNOSIS — K219 Gastro-esophageal reflux disease without esophagitis: Secondary | ICD-10-CM | POA: Diagnosis present

## 2022-12-01 DIAGNOSIS — J45909 Unspecified asthma, uncomplicated: Secondary | ICD-10-CM | POA: Diagnosis present

## 2022-12-01 DIAGNOSIS — N179 Acute kidney failure, unspecified: Secondary | ICD-10-CM | POA: Diagnosis present

## 2022-12-01 DIAGNOSIS — R6521 Severe sepsis with septic shock: Secondary | ICD-10-CM | POA: Diagnosis present

## 2022-12-01 DIAGNOSIS — E1142 Type 2 diabetes mellitus with diabetic polyneuropathy: Secondary | ICD-10-CM | POA: Diagnosis present

## 2022-12-01 DIAGNOSIS — F419 Anxiety disorder, unspecified: Secondary | ICD-10-CM | POA: Diagnosis present

## 2022-12-01 DIAGNOSIS — L0889 Other specified local infections of the skin and subcutaneous tissue: Secondary | ICD-10-CM | POA: Diagnosis present

## 2022-12-01 DIAGNOSIS — Z8249 Family history of ischemic heart disease and other diseases of the circulatory system: Secondary | ICD-10-CM

## 2022-12-01 LAB — URINE DRUG SCREEN, QUALITATIVE (ARMC ONLY)
Amphetamines, Ur Screen: NOT DETECTED
Barbiturates, Ur Screen: NOT DETECTED
Benzodiazepine, Ur Scrn: NOT DETECTED
Cannabinoid 50 Ng, Ur ~~LOC~~: POSITIVE — AB
Cocaine Metabolite,Ur ~~LOC~~: NOT DETECTED
MDMA (Ecstasy)Ur Screen: NOT DETECTED
Methadone Scn, Ur: NOT DETECTED
Opiate, Ur Screen: NOT DETECTED
Phencyclidine (PCP) Ur S: NOT DETECTED
Tricyclic, Ur Screen: NOT DETECTED

## 2022-12-01 LAB — BLOOD CULTURE ID PANEL (REFLEXED) - BCID2

## 2022-12-01 LAB — CBC WITH DIFFERENTIAL/PLATELET
Abs Immature Granulocytes: 0.07 10*3/uL (ref 0.00–0.07)
Basophils Absolute: 0.1 10*3/uL (ref 0.0–0.1)
Basophils Relative: 1 %
Eosinophils Absolute: 0.1 10*3/uL (ref 0.0–0.5)
Eosinophils Relative: 0 %
HCT: 44.8 % (ref 39.0–52.0)
Hemoglobin: 15.2 g/dL (ref 13.0–17.0)
Immature Granulocytes: 1 %
Lymphocytes Relative: 28 %
Lymphs Abs: 3.9 10*3/uL (ref 0.7–4.0)
MCH: 31.4 pg (ref 26.0–34.0)
MCHC: 33.9 g/dL (ref 30.0–36.0)
MCV: 92.6 fL (ref 80.0–100.0)
Monocytes Absolute: 0.9 10*3/uL (ref 0.1–1.0)
Monocytes Relative: 7 %
Neutro Abs: 8.9 10*3/uL — ABNORMAL HIGH (ref 1.7–7.7)
Neutrophils Relative %: 63 %
Platelets: 194 10*3/uL (ref 150–400)
RBC: 4.84 MIL/uL (ref 4.22–5.81)
RDW: 12.2 % (ref 11.5–15.5)
WBC: 13.9 10*3/uL — ABNORMAL HIGH (ref 4.0–10.5)
nRBC: 0 % (ref 0.0–0.2)

## 2022-12-01 LAB — COMPREHENSIVE METABOLIC PANEL
ALT: 21 U/L (ref 0–44)
AST: 42 U/L — ABNORMAL HIGH (ref 15–41)
Albumin: 3.5 g/dL (ref 3.5–5.0)
Alkaline Phosphatase: 126 U/L (ref 38–126)
Anion gap: 13 (ref 5–15)
BUN: 24 mg/dL — ABNORMAL HIGH (ref 6–20)
CO2: 22 mmol/L (ref 22–32)
Calcium: 8.7 mg/dL — ABNORMAL LOW (ref 8.9–10.3)
Chloride: 95 mmol/L — ABNORMAL LOW (ref 98–111)
Creatinine, Ser: 2.08 mg/dL — ABNORMAL HIGH (ref 0.61–1.24)
GFR, Estimated: 38 mL/min — ABNORMAL LOW (ref >60)
Glucose, Bld: 231 mg/dL — ABNORMAL HIGH (ref 70–99)
Potassium: 3.9 mmol/L (ref 3.5–5.1)
Sodium: 130 mmol/L — ABNORMAL LOW (ref 135–145)
Total Bilirubin: 0.9 mg/dL (ref 0.3–1.2)
Total Protein: 7.5 g/dL (ref 6.5–8.1)

## 2022-12-01 LAB — CBG MONITORING, ED
Glucose-Capillary: 168 mg/dL — ABNORMAL HIGH (ref 70–99)
Glucose-Capillary: 237 mg/dL — ABNORMAL HIGH (ref 70–99)

## 2022-12-01 LAB — URINALYSIS, W/ REFLEX TO CULTURE (INFECTION SUSPECTED)
Bilirubin Urine: NEGATIVE
Glucose, UA: >=500 mg/dL — AB
Hgb urine dipstick: NEGATIVE
Ketones, ur: NEGATIVE mg/dL
Leukocytes,Ua: NEGATIVE
Nitrite: NEGATIVE
Protein, ur: NEGATIVE mg/dL
Specific Gravity, Urine: 1.025 (ref 1.005–1.030)
pH: 5 (ref 5.0–8.0)

## 2022-12-01 LAB — LACTIC ACID, PLASMA
Lactic Acid, Venous: 2.6 mmol/L (ref 0.5–1.9)
Lactic Acid, Venous: 3.2 mmol/L (ref 0.5–1.9)
Lactic Acid, Venous: 1.7 mmol/L (ref 0.5–1.9)
Lactic Acid, Venous: 2.4 mmol/L (ref 0.5–1.9)

## 2022-12-01 LAB — PROTIME-INR
INR: 1 (ref 0.8–1.2)
Prothrombin Time: 13.8 seconds (ref 11.4–15.2)

## 2022-12-01 LAB — ECHOCARDIOGRAM COMPLETE
AR max vel: 2.5 cm2
AV Area VTI: 2.48 cm2
AV Area mean vel: 2.48 cm2
AV Mean grad: 4 mmHg
AV Peak grad: 7.5 mmHg
Ao pk vel: 1.37 m/s
Area-P 1/2: 3.11 cm2
Height: 66 in
MV VTI: 2.22 cm2
S' Lateral: 3.2 cm
Weight: 3520 oz

## 2022-12-01 LAB — RESP PANEL BY RT-PCR (RSV, FLU A&B, COVID)  RVPGX2
Influenza A by PCR: NEGATIVE
Influenza B by PCR: NEGATIVE
Resp Syncytial Virus by PCR: NEGATIVE
SARS Coronavirus 2 by RT PCR: NEGATIVE

## 2022-12-01 LAB — CULTURE, BLOOD (SINGLE)

## 2022-12-01 LAB — APTT: aPTT: 26 seconds (ref 24–36)

## 2022-12-01 LAB — PROCALCITONIN: Procalcitonin: 0.41 ng/mL

## 2022-12-01 LAB — TROPONIN I (HIGH SENSITIVITY)
Troponin I (High Sensitivity): 7 ng/L (ref <18)
Troponin I (High Sensitivity): 5 ng/L (ref <18)

## 2022-12-01 LAB — CORTISOL: Cortisol, Plasma: 9.1 ug/dL

## 2022-12-01 LAB — HIV ANTIBODY (ROUTINE TESTING W REFLEX): HIV Screen 4th Generation wRfx: NONREACTIVE

## 2022-12-01 MED ORDER — TAMSULOSIN HCL 0.4 MG PO CAPS
0.4000 mg | ORAL_CAPSULE | Freq: Every day | ORAL | Status: DC
  Administered 2022-12-01 – 2022-12-03 (×3): 0.4 mg via ORAL
  Filled 2022-12-01 (×3): qty 1

## 2022-12-01 MED ORDER — VANCOMYCIN HCL 2000 MG/400ML IV SOLN
2000.0000 mg | INTRAVENOUS | Status: AC
  Administered 2022-12-01: 2000 mg via INTRAVENOUS
  Filled 2022-12-01: qty 400

## 2022-12-01 MED ORDER — SODIUM CHLORIDE 0.9 % IV SOLN: 12.5000 mg | Freq: Four times a day (QID) | INTRAVENOUS | Status: DC | PRN

## 2022-12-01 MED ORDER — HEPARIN SODIUM (PORCINE) 5000 UNIT/ML IJ SOLN
5000.0000 [IU] | Freq: Three times a day (TID) | INTRAMUSCULAR | Status: DC
  Administered 2022-12-01 – 2022-12-03 (×6): 5000 [IU] via SUBCUTANEOUS
  Filled 2022-12-01 (×6): qty 1

## 2022-12-01 MED ORDER — SODIUM CHLORIDE 0.9 % IV BOLUS: 1000.0000 mL | Freq: Once | INTRAVENOUS | Status: DC

## 2022-12-01 MED ORDER — MORPHINE SULFATE (PF) 2 MG/ML IV SOLN
2.0000 mg | INTRAVENOUS | Status: DC | PRN
  Administered 2022-12-01 – 2022-12-02 (×2): 2 mg via INTRAVENOUS
  Filled 2022-12-01 (×2): qty 1

## 2022-12-01 MED ORDER — LACTATED RINGERS IV BOLUS (SEPSIS)
1000.0000 mL | Freq: Once | INTRAVENOUS | Status: AC
  Administered 2022-12-01: 1000 mL via INTRAVENOUS

## 2022-12-01 MED ORDER — SODIUM CHLORIDE 0.9 % IV SOLN
2.0000 g | INTRAVENOUS | Status: AC
  Administered 2022-12-01: 2 g via INTRAVENOUS
  Filled 2022-12-01: qty 12.5

## 2022-12-01 MED ORDER — LACTATED RINGERS IV SOLN: INTRAVENOUS | Status: AC

## 2022-12-01 MED ORDER — MIDODRINE HCL 5 MG PO TABS
10.0000 mg | ORAL_TABLET | Freq: Three times a day (TID) | ORAL | Status: DC
  Administered 2022-12-01 – 2022-12-02 (×3): 10 mg via ORAL
  Filled 2022-12-01 (×3): qty 2

## 2022-12-01 MED ORDER — QUETIAPINE FUMARATE 25 MG PO TABS
25.0000 mg | ORAL_TABLET | Freq: Every day | ORAL | Status: DC
  Administered 2022-12-01 – 2022-12-03 (×3): 25 mg via ORAL
  Filled 2022-12-01 (×3): qty 1

## 2022-12-01 MED ORDER — HYDROCODONE-ACETAMINOPHEN 5-325 MG PO TABS
1.0000 | ORAL_TABLET | Freq: Four times a day (QID) | ORAL | Status: DC | PRN
  Administered 2022-12-01: 1 via ORAL
  Administered 2022-12-03: 2 via ORAL
  Filled 2022-12-01: qty 2
  Filled 2022-12-01: qty 1

## 2022-12-01 MED ORDER — TIZANIDINE HCL 4 MG PO TABS
4.0000 mg | ORAL_TABLET | Freq: Three times a day (TID) | ORAL | Status: DC | PRN
  Administered 2022-12-01: 4 mg via ORAL
  Filled 2022-12-01: qty 2

## 2022-12-01 MED ORDER — PANTOPRAZOLE SODIUM 40 MG PO TBEC
40.0000 mg | DELAYED_RELEASE_TABLET | Freq: Every day | ORAL | Status: DC
  Administered 2022-12-02 – 2022-12-04 (×3): 40 mg via ORAL
  Filled 2022-12-01 (×3): qty 1

## 2022-12-01 MED ORDER — NOREPINEPHRINE 4 MG/250ML-% IV SOLN
0.0000 ug/min | INTRAVENOUS | Status: DC
  Administered 2022-12-01: 2 ug/min via INTRAVENOUS
  Filled 2022-12-01: qty 250

## 2022-12-01 MED ORDER — SODIUM CHLORIDE 0.9 % IV BOLUS
1000.0000 mL | Freq: Once | INTRAVENOUS | Status: AC
  Administered 2022-12-01: 1000 mL via INTRAVENOUS

## 2022-12-01 MED ORDER — ASPIRIN 81 MG PO TBEC
81.0000 mg | DELAYED_RELEASE_TABLET | Freq: Every day | ORAL | Status: DC
  Administered 2022-12-02 – 2022-12-04 (×3): 81 mg via ORAL
  Filled 2022-12-01 (×3): qty 1

## 2022-12-01 MED ORDER — POLYETHYLENE GLYCOL 3350 17 G PO PACK: 17.0000 g | PACK | Freq: Every day | ORAL | Status: DC | PRN

## 2022-12-01 MED ORDER — INSULIN ASPART 100 UNIT/ML IJ SOLN
0.0000 [IU] | Freq: Three times a day (TID) | INTRAMUSCULAR | Status: DC
  Administered 2022-12-01 – 2022-12-02 (×2): 3 [IU] via SUBCUTANEOUS
  Administered 2022-12-02: 8 [IU] via SUBCUTANEOUS
  Administered 2022-12-02: 5 [IU] via SUBCUTANEOUS
  Administered 2022-12-03 (×2): 3 [IU] via SUBCUTANEOUS
  Administered 2022-12-03: 8 [IU] via SUBCUTANEOUS
  Administered 2022-12-04: 5 [IU] via SUBCUTANEOUS
  Administered 2022-12-04: 8 [IU] via SUBCUTANEOUS
  Filled 2022-12-01 (×9): qty 1

## 2022-12-01 MED ORDER — DOCUSATE SODIUM 100 MG PO CAPS: 100.0000 mg | ORAL_CAPSULE | Freq: Two times a day (BID) | ORAL | Status: DC | PRN

## 2022-12-01 MED ORDER — INSULIN ASPART 100 UNIT/ML IJ SOLN
0.0000 [IU] | Freq: Every day | INTRAMUSCULAR | Status: DC
  Administered 2022-12-01 – 2022-12-03 (×2): 2 [IU] via SUBCUTANEOUS
  Filled 2022-12-01 (×2): qty 1

## 2022-12-01 MED ORDER — METRONIDAZOLE 500 MG/100ML IV SOLN: 500.0000 mg | Freq: Once | INTRAVENOUS | Status: DC

## 2022-12-01 MED ORDER — SODIUM CHLORIDE 0.9 % IV SOLN
3.0000 g | Freq: Four times a day (QID) | INTRAVENOUS | Status: DC
  Administered 2022-12-01 – 2022-12-04 (×12): 3 g via INTRAVENOUS
  Filled 2022-12-01 (×13): qty 8

## 2022-12-01 MED ORDER — ONDANSETRON HCL 4 MG/2ML IJ SOLN
4.0000 mg | Freq: Four times a day (QID) | INTRAMUSCULAR | Status: DC | PRN
  Administered 2022-12-01: 4 mg via INTRAVENOUS
  Filled 2022-12-01: qty 2

## 2022-12-01 MED ORDER — SODIUM CHLORIDE 0.9 % IV SOLN: 2.0000 g | Freq: Once | INTRAVENOUS | Status: DC

## 2022-12-01 MED ORDER — VANCOMYCIN HCL IN DEXTROSE 1-5 GM/200ML-% IV SOLN: 1000.0000 mg | Freq: Once | INTRAVENOUS | Status: DC

## 2022-12-01 NOTE — ED Notes (Signed)
Ultrasound IV in progress at this time for levophed ggt

## 2022-12-01 NOTE — ED Notes (Signed)
Ultrasound at bedside at this time.

## 2022-12-01 NOTE — Consult Note (Signed)
NAME: Dustin Williamson  DOB: [birth year]  MRN: [id number]  Date/Time: 12/01/2022 11:59 AM  REQUESTING PROVIDER: Janine Limbo Subjective:  REASON FOR CONSULT: Gbstreptococcus bacteremia ? Dustin Williamson is a 52 y.o. with a history of DM Presented to the ED on 6/13 with weakness  and fever and vomiting for 2 days Has an injury on his rt toe that has not healed in many weeks He was hypotensive on admission and given Iv fluids- Blood culture sent WBC was 17 NA 126, K 3.1, Blood glucose 351 .lactate 3 He was hydrated and discharged home as he was stable He is called back as his blood culture is positive for Group B streptococcus Pt says he has not been well for a few days- has been dizzy, has had falls, subjective fever. Pain between shoulder blades after falling Also having some pain deep in the perineum  Past Medical History:  Diagnosis Date   Anxiety    Asthma    CHF (congestive heart failure) (HCC)    Depressed    Diabetes mellitus without complication (HCC)    Diastolic heart failure (HCC)    GERD (gastroesophageal reflux disease)    Heartburn    HLD (hyperlipidemia)    Hypertension    Kidney stone    Migraines    Neuropathy    Sleep apnea     Past Surgical History:  Procedure Laterality Date   CYSTOSCOPY W/ URETERAL STENT REMOVAL N/A 05/27/2016   Procedure: CYSTOSCOPY WITH STENT REMOVAL;  Surgeon: Marin Olp, MD;  Location: [facility];  Service: Urology;  Laterality: N/A;   CYSTOSCOPY WITH STENT PLACEMENT Left 05/05/2016   Procedure: CYSTOSCOPY WITH STENT PLACEMENT;  Surgeon: Vanna Scotland, MD;  Location: [facility];  Service: Urology;  Laterality: Left;   HERNIA REPAIR     KNEE SURGERY Left 2009    Social History   Socioeconomic History   Marital status: Single    Spouse name: Not on file   Number of children: Not on file   Years of education: Not on file   Highest education level: Not on file  Occupational History   Occupation: unemployed  Tobacco Use   Smoking  status: Former    Packs/day: 0.50    Years: 10.00    Additional pack years: 0.00    Total pack years: 5.00    Types: Cigarettes    Quit date: 06/19/2000    Years since quitting: 22.4   Smokeless tobacco: Never   Tobacco comments:    Hasn't smoked in 15 yrs or so   Vaping Use   Vaping Use: Former  Substance and Sexual Activity   Alcohol use: No    Comment: been 3-4 yrs since last drank    Drug use: Yes    Types: Marijuana    Comment: Smokes once daily.   Sexual activity: Not on file  Other Topics Concern   Not on file  Social History Narrative   Not on file   Social Determinants of Health   Financial Resource Strain: Not on file  Food Insecurity: Not on file  Transportation Needs: Not on file  Physical Activity: Not on file  Stress: Not on file  Social Connections: Not on file  Intimate Partner Violence: Not on file    Family History  Problem Relation Age of Onset   Hypertension Mother    High blood pressure Mother    Diabetes Mellitus II Father    Cancer Father    Multiple myeloma Father  Hypertension Brother    Dementia Paternal Aunt    Prostate cancer Neg Hx    Kidney disease Neg Hx    Bladder Cancer Neg Hx    No Known Allergies I? Current Facility-Administered Medications  Medication Dose Route Frequency Provider Last Rate Last Admin   lactated ringers infusion   Intravenous Continuous Corena Herter, MD   Held at 12/01/22 1016   vancomycin (VANCOREADY) IVPB 2000 mg/400 mL  2,000 mg Intravenous STAT Sharen Hones, RPH 200 mL/hr at 12/01/22 1059 2,000 mg at 12/01/22 1059   Current Outpatient Medications  Medication Sig Dispense Refill   albuterol (PROAIR HFA) 108 (90 Base) MCG/ACT inhaler Inhale 2 puffs into the lungs once every 4 to 6 hours as needed for wheezing. Replaces Ventolin HFA. 25.5 g 5   aspirin 81 MG EC tablet TAKE 1 TABLET BY MOUTH ONCE DAILY FOR HEART PROTECTION. 120 tablet 3   aspirin EC 81 MG tablet Take 81 mg by mouth daily. (Patient  not taking: Reported on 06/22/2021)     aspirin-acetaminophen-caffeine (EXCEDRIN MIGRAINE) 250-250-65 MG tablet Take 2 tablets by mouth every 6 (six) hours as needed for headache.     atorvastatin (LIPITOR) 80 MG tablet TAKE ONE TABLET BY MOUTH AT BEDTIME FOR HIGH CHOLESTEROL 90 tablet 3   busPIRone (BUSPAR) 15 MG tablet TAKE 1 TABLET BY MOUTH TWICE A DAY FOR MOOD. 60 tablet 11   DULoxetine (CYMBALTA) 30 MG capsule TAKE 3 CAPSULES BY MOUTH ONCE DAILY FOR MOOD AND PAIN. 270 capsule 1   empagliflozin (JARDIANCE) 25 MG TABS tablet TAKE 1 TABLET BY MOUTH ONCE DAILY FOR DIABETES. 90 tablet 3   furosemide (LASIX) 20 MG tablet TAKE 1 TABLET BY MOUTH ONCE DAILY FOR FLUID RETENTION. 90 tablet 1   gabapentin (NEURONTIN) 400 MG capsule TAKE 3 CAPSULES BY MOUTH EVERY MORNING AND AFTERNOON AND TAKE 4 CAPSULES BY MOUTH AT BEDTIME. 300 capsule 5   glucose blood (RIGHTEST GS550 BLOOD GLUCOSE) test strip Use as directed 100 each PRN   insulin degludec (TRESIBA FLEXTOUCH) 200 UNIT/ML FlexTouch Pen INJECT 160 UNITS UNDER THE SKIN ONCE DAILY. (Patient taking differently: Inject 160 Units into the skin. Patient reports taking 120-160 units.) 99 mL 1   Insulin Pen Needle (NOVOFINE PEN NEEDLE) 32G X 6 MM MISC USE AS DIRECTED 200 each PRN   lisinopril (PRINIVIL,ZESTRIL) 10 MG tablet Take 1 tablet (10 mg total) by mouth daily. (Patient taking differently: 10 mg. Reports taking 10mg  once daily.) 30 tablet 0   lisinopril (ZESTRIL) 5 MG tablet TAKE (1/2) TABLET (2.5MG  TOTAL) BY MOUTH ONCE DAILY FOR KIDNEY PROTECTION. 45 tablet 3   metFORMIN (GLUCOPHAGE) 1000 MG tablet TAKE ONE TABLET BY MOUTH 2 TIMES A DAY FOR DIABETES 180 tablet 3   NOVOLOG 100 UNIT/ML injection INJECT 45 UNITS UNDER THE SKIN EVERY MORNING. THEN 40 UNITS AT LUNCH. THEN 40 UNITS AT DINNER. (Patient not taking: Reported on 06/22/2021) 150 mL 1   omeprazole (PRILOSEC) 20 MG capsule Take 20 mg by mouth daily. Pt takes 20 mg in the morning and 40 mg at night (Patient  not taking: Reported on 06/22/2021)     omeprazole (PRILOSEC) 20 MG capsule TAKE 1 CAPSULE BY MOUTH ONCE DAILY FOR ACID REFLUX. 90 capsule 3   propranolol (INDERAL) 20 MG tablet TAKE 4 TABLETS BY MOUTH TWICE DAILY FOR HEADACHE PREVENTION AND BLOOD PRESSURE 240 tablet 5   QUEtiapine (SEROQUEL) 25 MG tablet TAKE ONE TABLET BY MOUTH ONCE NIGHTLY. 30 tablet 5  Rightest GL300 Lancets MISC Use as directed 100 each PRN   spironolactone (ALDACTONE) 25 MG tablet Take 25 mg by mouth daily.      spironolactone (ALDACTONE) 25 MG tablet TAKE (1/2) TABLET (12.5MG  TOTAL) BY MOUTH ONCE DAILY. 45 tablet 3   tiZANidine (ZANAFLEX) 4 MG tablet TAKE 1 TABLET BY MOUTH 4 TIMES DAILY AS NEEDED FOR MUSCLE SPASTICITY. 360 tablet 1   VENTOLIN HFA 108 (90 Base) MCG/ACT inhaler INHALE 2 PUFFS EVERY 4 TO 6 HOURS AS NEEDED FOR WHEEZING. 54 g 3     Abtx:  Anti-infectives (From admission, onward)    Start     Dose/Rate Route Frequency Ordered Stop   12/01/22 1030  vancomycin (VANCOCIN) IVPB 1000 mg/200 mL premix  Status:  Discontinued        1,000 mg 200 mL/hr over 60 Minutes Intravenous  Once 12/01/22 1017 12/01/22 1022   12/01/22 1030  ceFEPIme (MAXIPIME) 2 g in sodium chloride 0.9 % 100 mL IVPB  Status:  Discontinued        2 g 200 mL/hr over 30 Minutes Intravenous  Once 12/01/22 1017 12/01/22 1022   12/01/22 1030  metroNIDAZOLE (FLAGYL) IVPB 500 mg  Status:  Discontinued        500 mg 100 mL/hr over 60 Minutes Intravenous  Once 12/01/22 1017 12/01/22 1021   12/01/22 1030  ceFEPIme (MAXIPIME) 2 g in sodium chloride 0.9 % 100 mL IVPB        2 g 200 mL/hr over 30 Minutes Intravenous STAT 12/01/22 1022 12/01/22 1100   12/01/22 1030  vancomycin (VANCOREADY) IVPB 2000 mg/400 mL        2,000 mg 200 mL/hr over 120 Minutes Intravenous STAT 12/01/22 1022 12/02/22 1030       REVIEW OF SYSTEMS:  Const: subjective fever, negative chills, intentional weight loss of nearly 30 pounds Eyes: negative diplopia or visual changes,  negative eye pain ENT: negative coryza, negative sore throat Resp: negative cough, hemoptysis, dyspnea Cards: negative for chest pain, palpitations, lower extremity edema GU: negative for frequency, dysuria and hematuria GI: Negative for abdominal pain, diarrhea, bleeding, constipation Skin: negative for rash and pruritus Heme: negative for easy bruising and gum/nose bleeding ZO:XWRUEAV weakness, dizziness, falls Neurolo:, dizziness, no vertigo, memory problems  Psych: negative for feelings of anxiety, depression  Endocrine:poorly cntrolled diabetes- drinks 5 big bottles of Dr.pepper/day Allergy/Immunology- negative for any medication or food allergies ?  Objective:  VITALS:  BP (!) 86/63   Pulse 70   Temp (!) 97.5 F (36.4 C) (Oral)   Resp 20   SpO2 100%   PHYSICAL EXAM:  General: Alert, cooperative, no distress, appears stated age.  Head: Normocephalic, without obvious abnormality, atraumatic. Eyes: Conjunctivae clear, anicteric sclerae. Pupils are equal ENT Nares normal. No drainage or sinus tenderness. Lips, mucosa, and tongue normal. No Thrush Dentition borderline Neck: Supple, symmetrical, no adenopathy, thyroid: non tender no carotid bruit and no JVD. Back: No CVA tenderness. Lungs: Clear to auscultation bilaterally. No Wheezing or Rhonchi. No rales. Heart: Regular rate and rhythm, no murmur, rub or gallop. Abdomen: Soft, non-tender,not distended. Bowel sounds normal. No masses No scrotal/perineal  edema, erythema or wounds Extremities: left great toe- wound - some blood- not grossly infected looking     Skin: No rashes or lesions. Or bruising Lymph: Cervical, supraclavicular normal. Neurologic: Grossly non-focal Pertinent Labs Lab Results CBC    Component Value Date/Time   WBC 13.9 (H) 12/01/2022 0956   RBC 4.84 12/01/2022 0956  HGB 15.2 12/01/2022 0956   HGB 16.4 10/16/2014 1257   HCT 44.8 12/01/2022 0956   HCT 48.7 10/16/2014 1257   PLT 194  12/01/2022 0956   PLT 219 10/16/2014 1257   MCV 92.6 12/01/2022 0956   MCV 91 10/16/2014 1257   MCH 31.4 12/01/2022 0956   MCHC 33.9 12/01/2022 0956   RDW 12.2 12/01/2022 0956   RDW 12.8 10/16/2014 1257   LYMPHSABS 3.9 12/01/2022 0956   LYMPHSABS 4.2 (H) 10/16/2014 1257   MONOABS 0.9 12/01/2022 0956   MONOABS 0.8 10/16/2014 1257   EOSABS 0.1 12/01/2022 0956   EOSABS 0.1 10/16/2014 1257   BASOSABS 0.1 12/01/2022 0956   BASOSABS 0.1 10/16/2014 1257       Latest Ref Rng & Units 12/01/2022    9:56 AM 11/30/2022    3:00 PM 11/30/2022   12:44 PM  CMP  Glucose 70 - 99 mg/dL 914  92  782   BUN 6 - 20 mg/dL 24  18  19    Creatinine 0.61 - 1.24 mg/dL 9.56  2.13  0.86   Sodium 135 - 145 mmol/L 130  130  126   Potassium 3.5 - 5.1 mmol/L 3.9  3.0  3.1   Chloride 98 - 111 mmol/L 95  94  88   CO2 22 - 32 mmol/L 22  22  22    Calcium 8.9 - 10.3 mg/dL 8.7  8.6  9.7   Total Protein 6.5 - 8.1 g/dL 7.5     Total Bilirubin 0.3 - 1.2 mg/dL 0.9     Alkaline Phos 38 - 126 U/L 126     AST 15 - 41 U/L 42     ALT 0 - 44 U/L 21         Microbiology: Recent Results (from the past 240 hour(s))  Blood culture (single)     Status: None (Preliminary result)   Collection Time: 11/30/22 12:44 PM   Specimen: BLOOD  Result Value Ref Range Status   Specimen Description BLOOD  Final   Special Requests   Final    BOTTLES DRAWN AEROBIC AND ANAEROBIC Blood Culture adequate volume   Culture  Setup Time   Final    Organism ID to follow GRAM POSITIVE COCCI AEROBIC BOTTLE ONLY CRITICAL RESULT CALLED TO, READ BACK BY AND VERIFIED WITH: ASHLEY ORSUOTO AT 0131 12/01/22 BY SKL Performed at [facility], [address] [place], [place] [address]    Culture GRAM POSITIVE COCCI  Final   Report Status PENDING  Incomplete  Blood Culture ID Panel (Reflexed)     Status: Abnormal   Collection Time: 11/30/22 12:44 PM  Result Value Ref Range Status   Enterococcus faecalis NOT DETECTED NOT DETECTED Final    Enterococcus Faecium NOT DETECTED NOT DETECTED Final   Listeria monocytogenes NOT DETECTED NOT DETECTED Final   Staphylococcus species NOT DETECTED NOT DETECTED Final   Staphylococcus aureus (BCID) NOT DETECTED NOT DETECTED Final   Staphylococcus epidermidis NOT DETECTED NOT DETECTED Final   Staphylococcus lugdunensis NOT DETECTED NOT DETECTED Final   Streptococcus species DETECTED (A) NOT DETECTED Final    Comment: CRITICAL RESULT CALLED TO, READ BACK BY AND VERIFIED WITH: ASHLEY ORSUOTO @0131  ON 12/01/22 SKL    Streptococcus agalactiae DETECTED (A) NOT DETECTED Final    Comment: CRITICAL RESULT CALLED TO, READ BACK BY AND VERIFIED WITH: ASHLEY ORSUOTO @0131  ON 12/01/22 SKL    Streptococcus pneumoniae NOT DETECTED NOT DETECTED Final   Streptococcus pyogenes NOT DETECTED NOT  DETECTED Final   A.calcoaceticus-baumannii NOT DETECTED NOT DETECTED Final   Bacteroides fragilis NOT DETECTED NOT DETECTED Final   Enterobacterales NOT DETECTED NOT DETECTED Final   Enterobacter cloacae complex NOT DETECTED NOT DETECTED Final   Escherichia coli NOT DETECTED NOT DETECTED Final   Klebsiella aerogenes NOT DETECTED NOT DETECTED Final   Klebsiella oxytoca NOT DETECTED NOT DETECTED Final   Klebsiella pneumoniae NOT DETECTED NOT DETECTED Final   Proteus species NOT DETECTED NOT DETECTED Final   Salmonella species NOT DETECTED NOT DETECTED Final   Serratia marcescens NOT DETECTED NOT DETECTED Final   Haemophilus influenzae NOT DETECTED NOT DETECTED Final   Neisseria meningitidis NOT DETECTED NOT DETECTED Final   Pseudomonas aeruginosa NOT DETECTED NOT DETECTED Final   Stenotrophomonas maltophilia NOT DETECTED NOT DETECTED Final   Candida albicans NOT DETECTED NOT DETECTED Final   Candida auris NOT DETECTED NOT DETECTED Final   Candida glabrata NOT DETECTED NOT DETECTED Final   Candida krusei NOT DETECTED NOT DETECTED Final   Candida parapsilosis NOT DETECTED NOT DETECTED Final   Candida tropicalis NOT  DETECTED NOT DETECTED Final   Cryptococcus neoformans/gattii NOT DETECTED NOT DETECTED Final    Comment: Performed at [facility], [address] [place], [place] [address]  Resp panel by RT-PCR (RSV, Flu A&B, Covid) Anterior Nasal Swab     Status: None   Collection Time: 12/01/22  9:56 AM   Specimen: Anterior Nasal Swab  Result Value Ref Range Status   SARS Coronavirus 2 by RT PCR NEGATIVE NEGATIVE Final    Comment: (NOTE) SARS-CoV-2 target nucleic acids are NOT DETECTED.  The SARS-CoV-2 RNA is generally detectable in upper respiratory specimens during the acute phase of infection. The lowest concentration of SARS-CoV-2 viral copies this assay can detect is 138 copies/mL. A negative result does not preclude SARS-Cov-2 infection and should not be used as the sole basis for treatment or other patient management decisions. A negative result may occur with  improper specimen collection/handling, submission of specimen other than nasopharyngeal swab, presence of viral mutation(s) within the areas targeted by this assay, and inadequate number of viral copies(<138 copies/mL). A negative result must be combined with clinical observations, patient history, and epidemiological information. The expected result is Negative.  Fact Sheet for Patients:  BloggerCourse.com  Fact Sheet for Healthcare Providers:  SeriousBroker.it  This test is no t yet approved or cleared by the [place] FDA and  has been authorized for detection and/or diagnosis of SARS-CoV-2 by FDA under an Emergency Use Authorization (EUA). This EUA will remain  in effect (meaning this test can be used) for the duration of the COVID-19 declaration under Section 564(b)(1) of the Act, 21 U.S.C.section 360bbb-3(b)(1), unless the authorization is terminated  or revoked sooner.       Influenza A by PCR NEGATIVE NEGATIVE Final   Influenza B by PCR NEGATIVE NEGATIVE  Final    Comment: (NOTE) The Xpert Xpress SARS-CoV-2/FLU/RSV plus assay is intended as an aid in the diagnosis of influenza from Nasopharyngeal swab specimens and should not be used as a sole basis for treatment. Nasal washings and aspirates are unacceptable for Xpert Xpress SARS-CoV-2/FLU/RSV testing.  Fact Sheet for Patients: BloggerCourse.com  Fact Sheet for Healthcare Providers: SeriousBroker.it  This test is not yet approved or cleared by the [place] FDA and has been authorized for detection and/or diagnosis of SARS-CoV-2 by FDA under an Emergency Use Authorization (EUA). This EUA will remain in effect (meaning this test can be used)  for the duration of the COVID-19 declaration under Section 564(b)(1) of the Act, 21 U.S.C. section 360bbb-3(b)(1), unless the authorization is terminated or revoked.     Resp Syncytial Virus by PCR NEGATIVE NEGATIVE Final    Comment: (NOTE) Fact Sheet for Patients: BloggerCourse.com  Fact Sheet for Healthcare Providers: SeriousBroker.it  This test is not yet approved or cleared by the [place] FDA and has been authorized for detection and/or diagnosis of SARS-CoV-2 by FDA under an Emergency Use Authorization (EUA). This EUA will remain in effect (meaning this test can be used) for the duration of the COVID-19 declaration under Section 564(b)(1) of the Act, 21 U.S.C. section 360bbb-3(b)(1), unless the authorization is terminated or revoked.  Performed at [facility], [address] [place], [place] [address]     IMAGING RESULTS: CXR no inflitrate CT ehad no acute findings I have personally reviewed the films ? Impression/Recommendation ? ?Poorly controlled diabetes was on Metformin, insulin and jardiance at home Aslo drinking a lot of soda could have contributed   Falls , dizziness due to hypotension- may have  orthostasis Sepsis due to GBS  Streptococcus bacteremia- group B- source could be the left great toe- will get culture Give unasyn instead of vancomycin If wound culture neg can switch to PCN May need imaging of his thoracic and lumbar  spine if pain persist  AKI- due to sepsis and meds could have also contributed- lisinopril, spironolactone  Peripheral neuropathy due to DM ? ___________________________________________________ Discussed with patient, mother requesting provider Note:  This document was prepared using Dragon voice recognition software and may include unintentional dictation errors.

## 2022-12-01 NOTE — Consult Note (Signed)
PHARMACY -  BRIEF ANTIBIOTIC NOTE   Pharmacy has received consult(s) for cefepime and vancomycin from an ED provider.  The patient's profile has been reviewed for ht/wt/allergies/indication/available labs.    One time order(s) placed for  Cefepime 2 gram Vancomycin 2 gram   Further antibiotics/pharmacy consults should be ordered by admitting physician if indicated.                       Thank you, Sharen Hones, PharmD, BCPS Clinical Pharmacist   12/01/2022  10:24 AM

## 2022-12-01 NOTE — ED Notes (Signed)
Pt presents to ED due to a [facility] pharmacist calling him and telling him to return to ED due to "positive blood cultures". Pt states he was seen yesterday and was discharged. Pt is hypotensive on arrival but ambulatory with steady gait to RM 4. Pt does endorse he has taken his RX'ed BP meds today like he does everyday. Pt states slight fever this morning.   Pt does have a open wound to L great toe at the top of his toe near the nail, pt states HX of diabetes and states this wound initially started as a blister and has not healed correctly since. Wound does not appear to be severely infected at this time, minimal erythema and redness noted around surrounding tissue. Pt is A&Ox4, all other vitals are normal at this time.

## 2022-12-01 NOTE — Progress Notes (Addendum)
PHARMACY ANTIBIOTIC CONSULT NOTE   Dustin Williamson is an 52 y.o. male who presented to [facility] on 12/01/2022 with a chief complaint of sepsis 2/2 GBS bacteremia. Patient reportedly has a wound on L great toe. Pharmacy has been consulted to dose unasyn.   Patient received a dose of vancomycin and cefepime at ~1030 AM.   6/14: Scr 2.08, LA 3.2,  WBC 13.9  Vital Signs: Tm WNL, HR WNL, BP soft- on NE   Calculated creatinine clearance ~56 mL/min (C-G)- calculated using historical weight of ~95 kg   Plan: START Unasyn 3g IV Q6H  F/U de-excalation, ID Recs, renal function    Allergies:  No Known Allergies  There were no vitals filed for this visit.     Latest Ref Rng & Units 12/01/2022    9:56 AM 11/30/2022   12:44 PM 12/24/2020   11:01 AM  CBC  WBC 4.0 - 10.5 K/uL 13.9  17.2  9.3   Hemoglobin 13.0 - 17.0 g/dL 16.1  09.6  04.5   Hematocrit 39.0 - 52.0 % 44.8  46.6  49.2   Platelets 150 - 400 K/uL 194  245  200     Antibiotics Given (last 72 hours)     Date/Time Action Medication Dose Rate   12/01/22 1032 New Bag/Given   ceFEPIme (MAXIPIME) 2 g in sodium chloride 0.9 % 100 mL IVPB 2 g 200 mL/hr   12/01/22 1059 New Bag/Given   vancomycin (VANCOREADY) IVPB 2000 mg/400 mL 2,000 mg 200 mL/hr       Antimicrobials this admission: Van/CFP x1 6/14  Unasyn 6/14>>c   Microbiology results: 6/13 Bcx: 2/4 GBS  6/14 Bcx: sent   Thank you for allowing pharmacy to be a part of this patient's care.  Jani Gravel, PharmD PGY-2 Infectious Diseases Resident  12/01/2022 12:55 PM

## 2022-12-01 NOTE — ED Notes (Signed)
Pt throwing up at this time- zofran given see [facility]

## 2022-12-01 NOTE — Telephone Encounter (Signed)
After discussion with Dr. Rivka Safer, called patient to inform him of his positive blood culture with 2/4 bottles GBS. Patient did not answer the phone, so left a HIPAA-compliant voicemail with call back number to provide more information. Patient was advised to return to the ED.   Jani Gravel, PharmD PGY-2 Infectious Diseases Resident  12/01/2022 8:15 AM

## 2022-12-01 NOTE — ED Provider Notes (Addendum)
[facility] Provider Note    Event Date/Time   First MD Initiated Contact with Patient 12/01/22 [phone]     (approximate)   History   Abnormal Labs   HPI  Dustin Williamson is a 52 y.o. male past medical history significant for hypertension who presents to the emergency department with low blood pressure.  Patient was evaluated yesterday in the emergency department for low blood pressure and not feeling well.  Patient received multiple liters of IV fluids, did have blood cultures obtained at that time and was ultimately sent home for dehydration.  Patient returned today because blood pressure continued to drop causing gait instability at home.  Called with a positive blood culture result for group B strep.  Endorses 1 episode of vomiting.  No significant diarrhea.  Denies any significant cough.  No dysuria, urinary urgency or frequency.  Does have a wound to the left great toe.     Physical Exam   Triage Vital Signs: ED Triage Vitals  Enc Vitals Group     BP 12/01/22 0936 (!) 76/57     Pulse Rate 12/01/22 0936 70     Resp 12/01/22 0936 18     Temp 12/01/22 0935 (!) 97.5 F (36.4 C)     Temp Source 12/01/22 0935 Oral     SpO2 12/01/22 0936 93 %     Weight --      Height --      Head Circumference --      Peak Flow --      Pain Score 12/01/22 0935 5     Pain Loc --      Pain Edu? --      Excl. in GC? --     Most recent vital signs: Vitals:   12/01/22 1200 12/01/22 1203  BP: (!) 87/63   Pulse: 72   Resp: 17   Temp:  98.1 F (36.7 C)  SpO2: 100%     Physical Exam Constitutional:      Appearance: He is well-developed.  HENT:     Head: Atraumatic.  Eyes:     Conjunctiva/sclera: Conjunctivae normal.  Cardiovascular:     Rate and Rhythm: Regular rhythm.  Pulmonary:     Effort: No respiratory distress.  Abdominal:     Tenderness: There is no abdominal tenderness.  Musculoskeletal:     Cervical back: Normal range of motion.  Skin:     General: Skin is warm.     Comments: Left toe with erythema and warmth.  +2 DP pulses.  No wound that probes to bone.  Neurological:     Mental Status: He is alert. Mental status is at baseline.     IMPRESSION / MDM / ASSESSMENT AND PLAN / ED COURSE  I reviewed the triage vital signs and the nursing notes.  On arrival to the emergency department patient significantly hypotensive with a blood pressure of 76/57.  On review of blood cultures did have group B strep positive.  Felt that 30 cc/kg's may be detrimental to the patient given good perfusion, will give 1 L of IV fluids and reevaluate  After 1 L of IV fluids continued to be hypotensive so given a second liter of IV fluids with improvement of his blood pressure.  Started on maintenance fluids.  Patient started on broad-spectrum antibiotics with vancomycin and cefepime given his symptoms of severe sepsis.  On chart review patient had a CT scan Noncon of his abdomen yesterday that did  not show any acute pathology.  EKG  I, Corena Herter, the attending physician, personally viewed and interpreted this ECG.   Rate: Normal  Rhythm: Normal sinus  Axis: Normal  Intervals: Normal  ST&T Change: None  No tachycardic or bradycardic dysrhythmias while on cardiac telemetry.  RADIOLOGY I independently reviewed imaging, my interpretation of imaging: Chest x-ray with no signs of pneumonia.  LABS (all labs ordered are listed, but only abnormal results are displayed) Labs interpreted as -    Labs Reviewed  LACTIC ACID, PLASMA - Abnormal; Notable for the following components:      Result Value   Lactic Acid, Venous 2.6 (*)    All other components within normal limits  COMPREHENSIVE METABOLIC PANEL - Abnormal; Notable for the following components:   Sodium 130 (*)    Chloride 95 (*)    Glucose, Bld 231 (*)    BUN 24 (*)    Creatinine, Ser 2.08 (*)    Calcium 8.7 (*)    AST 42 (*)    GFR, Estimated 38 (*)    All other components  within normal limits  CBC WITH DIFFERENTIAL/PLATELET - Abnormal; Notable for the following components:   WBC 13.9 (*)    Neutro Abs 8.9 (*)    All other components within normal limits  URINALYSIS, W/ REFLEX TO CULTURE (INFECTION SUSPECTED) - Abnormal; Notable for the following components:   Color, Urine YELLOW (*)    APPearance CLEAR (*)    Glucose, UA >=500 (*)    Bacteria, UA RARE (*)    All other components within normal limits  RESP PANEL BY RT-PCR (RSV, FLU A&B, COVID)  RVPGX2  CULTURE, BLOOD (ROUTINE X 2)  CULTURE, BLOOD (ROUTINE X 2)  PROTIME-INR  APTT  LACTIC ACID, PLASMA     MDM    Initial lactic acid 2.6.  Leukocytosis.  Acute kidney injury and hyponatremia which is likely in the setting of hypovolemia and hypotension.  Blood pressure improved following 2 L of IV fluids.  Started on broad-spectrum antibiotics.  No signs of osteo on x-ray of the toe.  Consulted hospitalist for admission for sepsis.  12:11 PM On reevaluation continued to be hypotensive.  Given a 3rd L (3L) of IV fluids.  Started on peripheral Levophed.  Admitted to the ICU for septic shock   PROCEDURES:  Critical Care performed: yes  .Critical Care  Performed by: Corena Herter, MD Authorized by: Corena Herter, MD   Critical care provider statement:    Critical care time (minutes):  30   Critical care time was exclusive of:  Separately billable procedures and treating other patients   Critical care was necessary to treat or prevent imminent or life-threatening deterioration of the following conditions:  Sepsis   Critical care was time spent personally by me on the following activities:  Development of treatment plan with patient or surrogate, discussions with consultants, evaluation of patient's response to treatment, examination of patient, ordering and review of laboratory studies, ordering and review of radiographic studies, ordering and performing treatments and interventions, pulse oximetry,  re-evaluation of patient's condition and review of old charts   Patient's presentation is most consistent with acute presentation with potential threat to life or bodily function.   MEDICATIONS ORDERED IN ED: Medications  lactated ringers infusion (0 mLs Intravenous Hold 12/01/22 1016)  vancomycin (VANCOREADY) IVPB 2000 mg/400 mL (2,000 mg Intravenous New Bag/Given 12/01/22 1059)  norepinephrine (LEVOPHED) 4mg  in (0.016 mg/mL) premix infusion (has no administration in time  range)  lactated ringers bolus 1,000 mL (has no administration in time range)  lactated ringers bolus 1,000 mL (1,000 mLs Intravenous Bolus 12/01/22 1002)  ceFEPIme (MAXIPIME) 2 g in sodium chloride 0.9 % 100 mL IVPB (0 g Intravenous Stopped 12/01/22 1100)  sodium chloride 0.9 % bolus 1,000 mL (1,000 mLs Intravenous Bolus 12/01/22 1050)    FINAL CLINICAL IMPRESSION(S) / ED DIAGNOSES   Final diagnoses:  GBS (group B streptococcus) infection  Sepsis due to group B Streptococcus, unspecified whether acute organ dysfunction present [facility])     Rx / DC Orders   ED Discharge Orders     None        Note:  This document was prepared using Dragon voice recognition software and may include unintentional dictation errors.   Corena Herter, MD 12/01/22 1156    Corena Herter, MD 12/01/22 1212

## 2022-12-01 NOTE — Sepsis Progress Note (Signed)
eLink is following this Code Sepsis. °

## 2022-12-01 NOTE — H&P (Cosign Needed Addendum)
NAME:  Dustin Williamson, MRN:  [id number], DOB:  1971/05/07, LOS: 0 ADMISSION DATE:  12/01/2022, CONSULTATION DATE:  12/01/2022 REFERRING MD:  Dr Arnoldo Morale, CHIEF COMPLAINT:  Hypotension, Bacteremia   Brief Pt Description / Synopsis:  52 y.o. male with PMHx of HFpEF, HTN, CAD admitted with Severe Sepsis with septic shock due to Group B Strep BACTEREMIA, along with AKI and tiny bilateral renal calculi.  History of Present Illness:  Dustin Williamson is a 52 year old male with a past medical history significant for hypertension, HFpEF, GERD, hyperlipidemia, and diabetes mellitus who presents to [facility] ED on 12/01/2022 due to being urged to come to the ED following positive blood cultures from yesterday 11/30/2022,  along with complaints of low blood pressure, fever of 102 F, flank pain, groin and perineal pain.  Of note, He was seen and evaluated in the ED yesterday  11/30/22 for evaluation of several days of nausea, vomiting, polydipsia, polyuria, dysuria, right flank pain radiating to the groin, and lightheadedness upon standing.   He was given multiple liters of IV fluids, and blood cultures were obtained.  CT renal scan was negative, and there was no evidence of infection to the groin, genitals, or perineum.  Left toe did not appear grossly infected.  He was ultimately sent home after being treated for dehydration with recommendation to follow-up with his PCP to recheck the wound and for referral to podiatry versus wound clinic as needed.  However, Blood cultures from yesterday resulted with 2 out of 4 bottles for group B strep.  He also endorses chest pain (seems musculoskeletal in nature), cough without sputum production, flank pain, urinary hesitancy, groin and perineal pain. On exam there is no evidence of infection to the groin, genitals, or perineum.  The wound to his left great toe is reddened and warm, however does not appear grossly infected.  He denies dizziness, palpitations, shortness of breath,  vomiting, dysuria.  ED Course: Initial Vital Signs: Temperature 97.5 F orally, blood pressure 76/57, pulse 70, respiratory 18, SpO2 93% on room air Significant Labs: Sodium 130, glucose 231, BUN 24, creatinine 2.0, WBC 13.9, procalcitonin 0.41, lactic acid 2.6, high-sensitivity troponin 7 COVID-19/flu/RSV PCR is negative Imaging Chest X-ray>>IMPRESSION: Mildly decreased lung volumes. No acute cardiopulmonary process. X-ray Left great toe>>IMPRESSION: Subungual soft tissue thickening at the great toe. No definite osseous erosion to suggest osteomyelitis. No radiopaque foreign body. CT Renal Stone study (11/30/22)>>IMPRESSION: Tiny bilateral renal calculi. No evidence of ureteral calculi, hydronephrosis, or other acute findings. CT Head (11/30/22)>>IMPRESSION: No acute intracranial abnormality. Medications Administered: 1 L IV fluids, cefepime, vancomycin  Despite IV fluids, his blood pressure remains soft with plans to start peripheral Levophed.  PCCM asked to admit for further workup and treatment.  ID has been consulted due to bacteremia.  Please see "significant hospital events" section below for full detailed hospital course.  Pertinent  Medical History   Past Medical History:  Diagnosis Date   Anxiety    Asthma    CHF (congestive heart failure) (HCC)    Depressed    Diabetes mellitus without complication (HCC)    Diastolic heart failure (HCC)    GERD (gastroesophageal reflux disease)    Heartburn    HLD (hyperlipidemia)    Hypertension    Kidney stone    Migraines    Neuropathy    Sleep apnea      Micro Data:  6/13: Blood cultures>> 2/4 bottes w/ Group B Strep 6/14: Repeat Blood cultures x2>> 6/14: Urine>> 6/14: Left  toe wound>> 6/14: SARS-CoV-2/RSV/flu PCR>> negative  Antimicrobials:   Anti-infectives (From admission, onward)    Start     Dose/Rate Route Frequency Ordered Stop   12/01/22 1300  Ampicillin-Sulbactam (UNASYN) 3 g in sodium chloride 0.9 % 100  mL IVPB        3 g 200 mL/hr over 30 Minutes Intravenous Every 6 hours 12/01/22 1254     12/01/22 1030  vancomycin (VANCOCIN) IVPB 1000 mg/200 mL premix  Status:  Discontinued        1,000 mg 200 mL/hr over 60 Minutes Intravenous  Once 12/01/22 1017 12/01/22 1022   12/01/22 1030  ceFEPIme (MAXIPIME) 2 g in sodium chloride 0.9 % 100 mL IVPB  Status:  Discontinued        2 g 200 mL/hr over 30 Minutes Intravenous  Once 12/01/22 1017 12/01/22 1022   12/01/22 1030  metroNIDAZOLE (FLAGYL) IVPB 500 mg  Status:  Discontinued        500 mg 100 mL/hr over 60 Minutes Intravenous  Once 12/01/22 1017 12/01/22 1021   12/01/22 1030  ceFEPIme (MAXIPIME) 2 g in sodium chloride 0.9 % 100 mL IVPB        2 g 200 mL/hr over 30 Minutes Intravenous STAT 12/01/22 1022 12/01/22 1100   12/01/22 1030  vancomycin (VANCOREADY) IVPB 2000 mg/400 mL        2,000 mg 200 mL/hr over 120 Minutes Intravenous STAT 12/01/22 1022 12/01/22 1302        Significant Hospital Events: Including procedures, antibiotic start and stop dates in addition to other pertinent events   6/13: Presented to ED, hypotensive, thought to be dehydration, discharged home following IVF. 6/14: Blood culture from 6/13 resulted with 2/4 bottles Group B Strep, called to come back to ER.  Noted to be hypotensive requiring low dose peripheral Levophed.  PCCM asked to admit, ID consulted.  Interim History / Subjective:  -Patient's blood pressure is soft following 1 L fluid bolus (received multiple liters of fluid the previous day) ~low threshold to begin peripheral Levophed, PCCM asked to admit -Patient is awake and alert, in no distress acute distress -On room air -Complains of groin/flank/perineal pain ~no signs of infection to the groin, genitals, or perineum ~CT renal stone study yesterday only showed small renal calculi, no hydronephrosis, otherwise negative -Blood cultures from yesterday with group B strep ~will consult ID  Objective   Blood  pressure (!) 85/62, pulse 69, temperature 98.1 F (36.7 C), temperature source Oral, resp. rate 14, SpO2 100 %.       No intake or output data in the 24 hours ending 12/01/22 1244 There were no vitals filed for this visit.  Examination: General: Acute on chronically ill-appearing obese male, laying in bed, on room air, no acute distress HENT: Atraumatic, normocephalic, neck supple, no JVD Lungs: Clear breath sounds bilaterally, no wheezing or rales noted, even, nonlabored Cardiovascular: Regular rate and rhythm, S1-S2, no murmurs, rubs, gallops Abdomen: Obese, soft, nontender, no guarding or rebound tenderness, bowel sounds positive x 4 Extremities: Normal bulk and tone, no edema, wound to left great toe with erythema and warmth, but does not appear grossly infected Neuro: Awake and alert, oriented x 4, follows commands, no focal deficits, speech clear, pupils PERRLA GU: Deferred      Resolved Hospital Problem list     Assessment & Plan:   #Hypotension: Hypovolemic + Septic Shock #Chronic HFpEF without acute exacerbation PMHx: HFpEF, CAD, HTN, HLD Echocardiogram from 03/27/2018: EF greater than 55%,  grade 1 diastolic dysfunction -Continuous cardiac monitoring -Maintain MAP >65 -Gentle IV fluids -Vasopressors as needed to maintain MAP goal -Start midodrine -Lactic acid has normalized (2.6 ~ 3.2 ~ 2.4 ~ 1.7) -HS Troponin peaked at 7 -Repeat Echocardiogram pending -Check cortisol -Hold home propranolol  #Severe Sepsis due to Group B Strep BACTEREMIA #Left great toe wound X-ray of the left great toe on 6/14 without evidence of osteomyelitis CXR without evidence of pneumonia CT Abdomen/Renal Stone study with tiny bilateral renal calculi, otherwise no acute intraabdominal finding -Monitor fever curve -Trend WBC's & Procalcitonin -Follow cultures as above -ID consulted, appreciate input ~ will defer ABX to ID ~ Continue empiric Unasyn pending cultures & sensitivities -May  need CT vs MRI of left great toe -Consider Podiatry & wound care consults  #Acute Kidney Injury #Bilateral Renal Calculi CT Renal Stone study 6/13 with tiny bilateral renal calculi, no evidence of ureteral calculi, no hydronephrosis -Monitor I&O's / urinary output -Follow BMP -Ensure adequate renal perfusion -Avoid nephrotoxic agents as able -Replace electrolytes as indicated -IV fluids & Pain control ~ pt requesting Tamsulosin, will initiate  #Diabetes Mellitus -CBG's ac & hs; Target range of 140 to 180 -SSI -Follow ICU Hypo/Hyperglycemia protocol -Check Hgb A1c     Best Practice (right click and "Reselect all SmartList Selections" daily)   Diet/type: Regular consistency (see orders) DVT prophylaxis: prophylactic heparin  GI prophylaxis: PPI Lines: N/A Foley:  N/A Code Status:  full code Last date of multidisciplinary goals of care discussion [N/A]  6/14: Pt and his mother updated at bedside on plan of care.  Labs   CBC: Recent Labs  Lab 11/30/22 1244 12/01/22 0956  WBC 17.2* 13.9*  NEUTROABS  --  8.9*  HGB 16.6 15.2  HCT 46.6 44.8  MCV 88.9 92.6  PLT 245 194    Basic Metabolic Panel: Recent Labs  Lab 11/30/22 1244 11/30/22 1500 12/01/22 0956  NA 126* 130* 130*  K 3.1* 3.0* 3.9  CL 88* 94* 95*  CO2 22 22 22   GLUCOSE 351* 92 231*  BUN 19 18 24*  CREATININE 1.66* 1.42* 2.08*  CALCIUM 9.7 8.6* 8.7*   GFR: CrCl cannot be calculated (Unknown ideal weight.). Recent Labs  Lab 11/30/22 1244 11/30/22 1500 12/01/22 0956 12/01/22 1203  WBC 17.2*  --  13.9*  --   LATICACIDVEN 3.0* 2.5* 2.6* 3.2*    Liver Function Tests: Recent Labs  Lab 12/01/22 0956  AST 42*  ALT 21  ALKPHOS 126  BILITOT 0.9  PROT 7.5  ALBUMIN 3.5   No results for input(s): "LIPASE", "AMYLASE" in the last 168 hours. No results for input(s): "AMMONIA" in the last 168 hours.  ABG No results found for: "PHART", "PCO2ART", "PO2ART", "HCO3", "TCO2", "ACIDBASEDEF", "O2SAT"    Coagulation Profile: Recent Labs  Lab 12/01/22 0956  INR 1.0    Cardiac Enzymes: No results for input(s): "CKTOTAL", "CKMB", "CKMBINDEX", "TROPONINI" in the last 168 hours.  HbA1C: Hgb A1c MFr Bld  Date/Time Value Ref Range Status  11/15/2016 05:03 AM 11.0 (H) 4.8 - 5.6 % Final    Comment:    (NOTE)         Pre-diabetes: 5.7 - 6.4         Diabetes: >6.4         Glycemic control for adults with diabetes: <7.0   09/26/2015 01:23 AM 16.0 (H) 4.0 - 6.0 % Final    CBG: Recent Labs  Lab 11/30/22 1321  GLUCAP 247*    Review  of Systems:   Positives in BOLD: Gen: Denies fever, chills, weight change, fatigue, night sweats HEENT: Denies blurred vision, double vision, hearing loss, tinnitus, sinus congestion, rhinorrhea, sore throat, neck stiffness, dysphagia PULM: Denies shortness of breath, cough, sputum production, hemoptysis, wheezing CV: Denies chest pain, edema, orthopnea, paroxysmal nocturnal dyspnea, palpitations GI: Denies abdominal pain, nausea, vomiting, diarrhea, hematochezia, melena, constipation, change in bowel habits GU: Denies dysuria, hematuria, polyuria, oliguria, urethral discharge Endocrine: Denies hot or cold intolerance, polyuria, polyphagia or appetite change Derm: Denies rash, dry skin, scaling or peeling skin change Heme: Denies easy bruising, bleeding, bleeding gums Neuro: Denies headache, numbness, weakness, slurred speech, loss of memory or consciousness   Past Medical History:  He,  has a past medical history of Anxiety, Asthma, CHF (congestive heart failure) (HCC), Depressed, Diabetes mellitus without complication (HCC), Diastolic heart failure (HCC), GERD (gastroesophageal reflux disease), Heartburn, HLD (hyperlipidemia), Hypertension, Kidney stone, Migraines, Neuropathy, and Sleep apnea.   Surgical History:   Past Surgical History:  Procedure Laterality Date   CYSTOSCOPY W/ URETERAL STENT REMOVAL N/A 05/27/2016   Procedure: CYSTOSCOPY WITH  STENT REMOVAL;  Surgeon: Marin Olp, MD;  Location: [facility] ORS;  Service: Urology;  Laterality: N/A;   CYSTOSCOPY WITH STENT PLACEMENT Left 05/05/2016   Procedure: CYSTOSCOPY WITH STENT PLACEMENT;  Surgeon: Vanna Scotland, MD;  Location: [facility] ORS;  Service: Urology;  Laterality: Left;   HERNIA REPAIR     KNEE SURGERY Left 2009     Social History:   reports that he quit smoking about 22 years ago. His smoking use included cigarettes. He has a 5.00 pack-year smoking history. He has never used smokeless tobacco. He reports current drug use. Drug: Marijuana. He reports that he does not drink alcohol.   Family History:  His family history includes Cancer in his father; Dementia in his paternal aunt; Diabetes Mellitus II in his father; High blood pressure in his mother; Hypertension in his brother and mother; Multiple myeloma in his father. There is no history of Prostate cancer, Kidney disease, or Bladder Cancer.   Allergies No Known Allergies   Home Medications  Prior to Admission medications   Medication Sig Start Date End Date Taking? Authorizing Provider  atorvastatin (LIPITOR) 80 MG tablet TAKE ONE TABLET BY MOUTH AT BEDTIME FOR HIGH CHOLESTEROL 03/16/21 12/01/22 Yes   DULoxetine (CYMBALTA) 30 MG capsule TAKE 3 CAPSULES BY MOUTH ONCE DAILY FOR MOOD AND PAIN. 07/19/21  Yes   empagliflozin (JARDIANCE) 25 MG TABS tablet TAKE 1 TABLET BY MOUTH ONCE DAILY FOR DIABETES. 07/19/21  Yes   gabapentin (NEURONTIN) 400 MG capsule TAKE 3 CAPSULES BY MOUTH EVERY MORNING AND AFTERNOON AND TAKE 4 CAPSULES BY MOUTH AT BEDTIME. 07/06/21  Yes   lisinopril (ZESTRIL) 2.5 MG tablet Take 2.5 mg by mouth daily. 11/20/22  Yes [provider]  propranolol (INDERAL) 80 MG tablet Take 80 mg by mouth 2 (two) times daily. 11/20/22  Yes [provider]  tiZANidine (ZANAFLEX) 4 MG tablet TAKE 1 TABLET BY MOUTH 4 TIMES DAILY AS NEEDED FOR MUSCLE SPASTICITY. 07/19/21  Yes   albuterol (PROAIR HFA) 108 (90 Base) MCG/ACT  inhaler Inhale 2 puffs into the lungs once every 4 to 6 hours as needed for wheezing. Replaces Ventolin HFA. Patient not taking: Reported on 12/01/2022 08/14/21     aspirin 81 MG EC tablet TAKE 1 TABLET BY MOUTH ONCE DAILY FOR HEART PROTECTION. 07/19/21     aspirin-acetaminophen-caffeine (EXCEDRIN MIGRAINE) 250-250-65 MG tablet Take 2 tablets by mouth every  6 (six) hours as needed for headache.    [provider]  busPIRone (BUSPAR) 15 MG tablet TAKE 1 TABLET BY MOUTH TWICE A DAY FOR MOOD. 07/20/21     furosemide (LASIX) 20 MG tablet TAKE 1 TABLET BY MOUTH ONCE DAILY FOR FLUID RETENTION. 07/19/21     glucose blood (RIGHTEST GS550 BLOOD GLUCOSE) test strip Use as directed 02/07/21   Beryl Meager K, RPH  insulin degludec (TRESIBA FLEXTOUCH) 200 UNIT/ML FlexTouch Pen INJECT 160 UNITS UNDER THE SKIN ONCE DAILY. Patient taking differently: Inject 160 Units into the skin. Patient reports taking 120-160 units. 06/02/21     Insulin Pen Needle (NOVOFINE PEN NEEDLE) 32G X 6 MM MISC USE AS DIRECTED 03/15/21   Beryl Meager K, RPH  metFORMIN (GLUCOPHAGE) 1000 MG tablet TAKE ONE TABLET BY MOUTH 2 TIMES A DAY FOR DIABETES 03/16/21 03/16/22    NOVOLOG 100 UNIT/ML injection INJECT 45 UNITS UNDER THE SKIN EVERY MORNING. THEN 40 UNITS AT LUNCH. THEN 40 UNITS AT DINNER. Patient not taking: Reported on 06/22/2021 07/22/20 07/22/21  Karie Fetch A, MD  omeprazole (PRILOSEC) 20 MG capsule TAKE 1 CAPSULE BY MOUTH ONCE DAILY FOR ACID REFLUX. 07/19/21     QUEtiapine (SEROQUEL) 25 MG tablet TAKE ONE TABLET BY MOUTH ONCE NIGHTLY. 05/05/21     Rightest GL300 Lancets MISC Use as directed 02/07/21   Skipper Cliche, [facility]  spironolactone (ALDACTONE) 25 MG tablet TAKE (1/2) TABLET (12.5MG  TOTAL) BY MOUTH ONCE DAILY. 08/03/21     VENTOLIN HFA 108 (90 Base) MCG/ACT inhaler INHALE 2 PUFFS EVERY 4 TO 6 HOURS AS NEEDED FOR WHEEZING. 12/21/20 12/21/21       Critical care time: 55 minutes     Harlon Ditty, AGACNP-BC [facility] Prefer epic messenger for cross cover needs If after hours, please call E-link

## 2022-12-01 NOTE — ED Notes (Signed)
Pt eating lunch tray  

## 2022-12-01 NOTE — Progress Notes (Signed)
*  PRELIMINARY RESULTS* Echocardiogram 2D Echocardiogram has been performed.  Dustin Williamson 12/01/2022, 4:07 PM

## 2022-12-01 NOTE — Telephone Encounter (Signed)
Dr. Dolores Frame notified of patient's blood culture result, no additional orders at this time.

## 2022-12-01 NOTE — ED Triage Notes (Signed)
Patient to ED via [facility] for abnormal labs. Patient states he was called to return to the ED due to positive blood cultures. States he had 1 episode of vomiting today.

## 2022-12-01 NOTE — ED Notes (Addendum)
Pillow given, urinal emptied, lights dimmed, bed repositioned, drink pitcher filled- pt denies additional needs.

## 2022-12-01 NOTE — Consult Note (Signed)
PHARMACY CONSULT NOTE - FOLLOW UP  Pharmacy Consult for Electrolyte Monitoring and Replacement   Recent Labs: Potassium (mmol/L)  Date Value  12/01/2022 3.9  10/16/2014 3.7   Calcium (mg/dL)  Date Value  16/03/9603 8.7 (L)   Calcium, Total (mg/dL)  Date Value  54/02/8118 8.8 (L)   Albumin (g/dL)  Date Value  14/78/2956 3.5  10/16/2014 4.7   Sodium (mmol/L)  Date Value  12/01/2022 130 (L)  10/16/2014 133 (L)     Assessment: 52 y.o. male past medical history significant for hypertension who presents to the emergency department with low blood pressure. Found to have septic shock 2/2 GBS bacteremia. Pharmacy has been consulted to monitor and replace electrolytes while patient in ICU.   Goal of Therapy:  Electrolytes WNL  Plan:  No additional electrolyte replacement at this time Follow up all electrolytes with AM labs  Sharen Hones ,PharmD Clinical Pharmacist 12/01/2022 3:54 PM

## 2022-12-01 NOTE — Consult Note (Signed)
CODE SEPSIS - PHARMACY COMMUNICATION  **Broad Spectrum Antibiotics should be administered within 1 hour of Sepsis diagnosis**  Time Code Sepsis Called/Page Received: 1607  Antibiotics Ordered: Vancomycin, cefepime   Time of 1st antibiotic administration: 1032  Additional action taken by pharmacy:   If necessary, Name of Provider/Nurse Contacted:     Sharen Hones ,PharmD Clinical Pharmacist  12/01/2022  10:23 AM

## 2022-12-02 DIAGNOSIS — R7881 Bacteremia: Secondary | ICD-10-CM

## 2022-12-02 DIAGNOSIS — A491 Streptococcal infection, unspecified site: Secondary | ICD-10-CM

## 2022-12-02 DIAGNOSIS — A401 Sepsis due to streptococcus, group B: Secondary | ICD-10-CM

## 2022-12-02 DIAGNOSIS — I5032 Chronic diastolic (congestive) heart failure: Secondary | ICD-10-CM

## 2022-12-02 DIAGNOSIS — N179 Acute kidney failure, unspecified: Secondary | ICD-10-CM

## 2022-12-02 LAB — GLUCOSE, CAPILLARY
Glucose-Capillary: 186 mg/dL — ABNORMAL HIGH (ref 70–99)
Glucose-Capillary: 181 mg/dL — ABNORMAL HIGH (ref 70–99)

## 2022-12-02 LAB — CBC
HCT: 39 % (ref 39.0–52.0)
Hemoglobin: 13.1 g/dL (ref 13.0–17.0)
MCH: 31.5 pg (ref 26.0–34.0)
MCHC: 33.6 g/dL (ref 30.0–36.0)
MCV: 93.8 fL (ref 80.0–100.0)
Platelets: 172 10*3/uL (ref 150–400)
RBC: 4.16 MIL/uL — ABNORMAL LOW (ref 4.22–5.81)
RDW: 12.1 % (ref 11.5–15.5)
WBC: 8.5 10*3/uL (ref 4.0–10.5)
nRBC: 0 % (ref 0.0–0.2)

## 2022-12-02 LAB — RENAL FUNCTION PANEL
Albumin: 2.8 g/dL — ABNORMAL LOW (ref 3.5–5.0)
Anion gap: 12 (ref 5–15)
BUN: 13 mg/dL (ref 6–20)
CO2: 21 mmol/L — ABNORMAL LOW (ref 22–32)
Calcium: 8.1 mg/dL — ABNORMAL LOW (ref 8.9–10.3)
Chloride: 99 mmol/L (ref 98–111)
Creatinine, Ser: 1.06 mg/dL (ref 0.61–1.24)
GFR, Estimated: >60 mL/min (ref >60)
Glucose, Bld: 175 mg/dL — ABNORMAL HIGH (ref 70–99)
Phosphorus: 3 mg/dL (ref 2.5–4.6)
Potassium: 3.3 mmol/L — ABNORMAL LOW (ref 3.5–5.1)
Sodium: 132 mmol/L — ABNORMAL LOW (ref 135–145)

## 2022-12-02 LAB — CBG MONITORING, ED
Glucose-Capillary: 244 mg/dL — ABNORMAL HIGH (ref 70–99)
Glucose-Capillary: 241 mg/dL — ABNORMAL HIGH (ref 70–99)

## 2022-12-02 LAB — AEROBIC CULTURE W GRAM STAIN (SUPERFICIAL SPECIMEN)

## 2022-12-02 LAB — MAGNESIUM: Magnesium: 1.2 mg/dL — ABNORMAL LOW (ref 1.7–2.4)

## 2022-12-02 LAB — CULTURE, BLOOD (ROUTINE X 2)

## 2022-12-02 LAB — CULTURE, BLOOD (SINGLE)

## 2022-12-02 LAB — PROCALCITONIN: Procalcitonin: 0.23 ng/mL

## 2022-12-02 MED ORDER — SODIUM CHLORIDE 0.9 % IV SOLN
250.0000 mL | INTRAVENOUS | Status: DC
  Administered 2022-12-02: 250 mL via INTRAVENOUS

## 2022-12-02 MED ORDER — GABAPENTIN 300 MG PO CAPS
400.0000 mg | ORAL_CAPSULE | Freq: Three times a day (TID) | ORAL | Status: DC
  Administered 2022-12-02 – 2022-12-04 (×6): 400 mg via ORAL
  Filled 2022-12-02 (×6): qty 1

## 2022-12-02 MED ORDER — PROPRANOLOL HCL 20 MG PO TABS
20.0000 mg | ORAL_TABLET | Freq: Two times a day (BID) | ORAL | Status: DC
  Administered 2022-12-02: 20 mg via ORAL
  Filled 2022-12-02: qty 1

## 2022-12-02 MED ORDER — INSULIN GLARGINE-YFGN 100 UNIT/ML ~~LOC~~ SOLN
20.0000 [IU] | Freq: Every day | SUBCUTANEOUS | Status: DC
  Administered 2022-12-02 – 2022-12-04 (×3): 20 [IU] via SUBCUTANEOUS
  Filled 2022-12-02 (×3): qty 0.2

## 2022-12-02 MED ORDER — MIDODRINE HCL 5 MG PO TABS
5.0000 mg | ORAL_TABLET | Freq: Three times a day (TID) | ORAL | Status: DC
  Administered 2022-12-02: 5 mg via ORAL
  Filled 2022-12-02: qty 1

## 2022-12-02 NOTE — Progress Notes (Signed)
  Progress Note   Patient: Dustin Williamson VWU:[id number] DOB: December 29, 1970 DOA: 12/01/2022     1 DOS: the patient was seen and examined on 12/02/2022   Brief hospital course: Dustin Williamson is a 52 year old male with a past medical history significant for hypertension, HFpEF, GERD, hyperlipidemia, and diabetes mellitus who presents to [facility] ED on 12/01/2022 due to being urged to come to the ED following positive blood cultures from yesterday 11/30/2022, along with complaints of low blood pressure, fever of 102 F, flank pain, groin and perineal pain.  Patient is admitted initially admitted to critical care service for severe sepsis, group B strep bacteremia.  Patient was hypotensive, started on IV fluids, midodrine therapy did improve.  Patient's level of care downgraded to telemetry floor, transferred to hospitalist service.  Assessment and Plan: Severe sepsis Group B strep bacteremia Left great toe- Patient's blood pressure, tachycardia tachypnea improved. Lactic acid, white count improved with fluids. Patient's midodrine dose decreased. He did not receive any pressors, downgraded to medical service. Avoid opiates, muscle relaxants if possible. ID evaluation is appreciated. Continue Unasyn therapy. Follow repeat blood cultures, sensitivities.  Acute kidney injury in the setting of sepsis, dehydration. Bilateral renal calculi- Creatinine did improve with IV fluids. Avoid nephrotoxic drugs. No evidence of obstructive etiology on his CT renal study. Continue tamsulosin.  Uncontrolled type 2 diabetes mellitus- Blood sugars elevated. A1c pending. Started Lantus 20 units daily. Continue Accu-Cheks and sliding scale insulin.  Chronic diastolic CHF- Lasix, Lisinopril, Aldactone on hold due to renal dysfunction. He is euvolemic. Caution with IV fluids. Propranolol low dose initiated due to tachycardia. Continue telemetry.  Anxiety/ depression- Continue seroquel.     Subjective: Patient is  seen and examined today morning in the emergency department room 4.  He is sleeping comfortably, arousable, denies any complaints.  Appetite good.  Wishes to eat breakfast.  Did not get out of bed.  Physical Exam: Vitals:   12/02/22 1200 12/02/22 1205 12/02/22 1254 12/02/22 1615  BP: (!) 158/95  115/83 130/88  Pulse: (!) 156 94  75  Resp: (!) 29 18  17   Temp: 98.6 F (37 C)     TempSrc: Oral     SpO2: 100% 97%  99%  Weight:      Height:       General -middle-aged obese Caucasian male, sleeping, no apparent distress HEENT - PERRLA, EOMI, atraumatic head, non tender sinuses. Lung -distant breath sounds, diffuse rhonchi Heart - S1, S2 heard, no murmurs, rubs, trace pedal edema Neuro - Alert, awake and oriented x x 3, non focal exam. Skin - Warm and dry. Data Reviewed:  Blood sugars, CBC, BMP, cultures  Family Communication: Patient understands and agrees with above care plan.  Disposition: Status is: Inpatient Remains inpatient appropriate because: Sepsis protocol, IV antibiotics, follow cultures, clinical response  Planned Discharge Destination: Home with Home Health    MDM level 3- Patient presented with severe sepsis, bacteremia need close hemodynamic, neurological, electrolyte, telemetry monitoring.  He is at high risk for clinical deterioration.  Author: Marcelino Duster, MD 12/02/2022 4:41 PM  For on call review [email or web address].

## 2022-12-02 NOTE — Progress Notes (Signed)
NAME:  Dustin Williamson, MRN:  [id number], DOB:  1970-06-21, LOS: 1 ADMISSION DATE:  12/01/2022, CONSULTATION DATE:  12/01/2022 REFERRING MD:  Dr Arnoldo Morale, CHIEF COMPLAINT:  Hypotension, Bacteremia   Brief Pt Description / Synopsis:  52 y.o. male with PMHx of HFpEF, HTN, CAD admitted with Severe Sepsis with septic shock due to Group B Strep BACTEREMIA, along with AKI and tiny bilateral renal calculi.  History of Present Illness:  Dustin Williamson is a 52 year old male with a past medical history significant for hypertension, HFpEF, GERD, hyperlipidemia, and diabetes mellitus who presents to [facility] ED on 12/01/2022 due to being urged to come to the ED following positive blood cultures from yesterday 11/30/2022,  along with complaints of low blood pressure, fever of 102 F, flank pain, groin and perineal pain.  Of note, He was seen and evaluated in the ED yesterday  11/30/22 for evaluation of several days of nausea, vomiting, polydipsia, polyuria, dysuria, right flank pain radiating to the groin, and lightheadedness upon standing.   He was given multiple liters of IV fluids, and blood cultures were obtained.  CT renal scan was negative, and there was no evidence of infection to the groin, genitals, or perineum.  Left toe did not appear grossly infected.  He was ultimately sent home after being treated for dehydration with recommendation to follow-up with his PCP to recheck the wound and for referral to podiatry versus wound clinic as needed.  However, Blood cultures from yesterday resulted with 2 out of 4 bottles for group B strep.  He also endorses chest pain (seems musculoskeletal in nature), cough without sputum production, flank pain, urinary hesitancy, groin and perineal pain. On exam there is no evidence of infection to the groin, genitals, or perineum.  The wound to his left great toe is reddened and warm, however does not appear grossly infected.  He denies dizziness, palpitations, shortness of breath,  vomiting, dysuria.  12/02/22-patient in no distress on room air. Overnight renal function has improved to normal.  CBC with normalization of leukocytosis. PCCM will sign off and TR[facility] is assuming care of patient.  ID on case.  Have reduced midodrine to 5 tid. And patient remains on unasyn.   Pertinent  Medical History   Past Medical History:  Diagnosis Date   Anxiety    Asthma    CHF (congestive heart failure) (HCC)    Depressed    Diabetes mellitus without complication (HCC)    Diastolic heart failure (HCC)    GERD (gastroesophageal reflux disease)    Heartburn    HLD (hyperlipidemia)    Hypertension    Kidney stone    Migraines    Neuropathy    Sleep apnea      Micro Data:  6/13: Blood cultures>> 2/4 bottes w/ Group B Strep 6/14: Repeat Blood cultures x2>> 6/14: Urine>> 6/14: Left toe wound>> 6/14: SARS-CoV-2/RSV/flu PCR>> negative  Antimicrobials:   Anti-infectives (From admission, onward)    Start     Dose/Rate Route Frequency Ordered Stop   12/01/22 1300  Ampicillin-Sulbactam (UNASYN) 3 g in sodium chloride 0.9 % 100 mL IVPB        3 g 200 mL/hr over 30 Minutes Intravenous Every 6 hours 12/01/22 1254     12/01/22 1030  vancomycin (VANCOCIN) IVPB 1000 mg/200 mL premix  Status:  Discontinued        1,000 mg 200 mL/hr over 60 Minutes Intravenous  Once 12/01/22 1017 12/01/22 1022   12/01/22 1030  ceFEPIme (MAXIPIME) 2  g in sodium chloride 0.9 % 100 mL IVPB  Status:  Discontinued        2 g 200 mL/hr over 30 Minutes Intravenous  Once 12/01/22 1017 12/01/22 1022   12/01/22 1030  metroNIDAZOLE (FLAGYL) IVPB 500 mg  Status:  Discontinued        500 mg 100 mL/hr over 60 Minutes Intravenous  Once 12/01/22 1017 12/01/22 1021   12/01/22 1030  ceFEPIme (MAXIPIME) 2 g in sodium chloride 0.9 % 100 mL IVPB        2 g 200 mL/hr over 30 Minutes Intravenous STAT 12/01/22 1022 12/01/22 1100   12/01/22 1030  vancomycin (VANCOREADY) IVPB 2000 mg/400 mL        2,000 mg 200 mL/hr over  120 Minutes Intravenous STAT 12/01/22 1022 12/01/22 1302        Significant Hospital Events: Including procedures, antibiotic start and stop dates in addition to other pertinent events   6/13: Presented to ED, hypotensive, thought to be dehydration, discharged home following IVF. 6/14: Blood culture from 6/13 resulted with 2/4 bottles Group B Strep, called to come back to ER.  Noted to be hypotensive requiring low dose peripheral Levophed.  PCCM asked to admit, ID consulted.  Interim History / Subjective:  -Patient's blood pressure is soft following 1 L fluid bolus (received multiple liters of fluid the previous day) ~low threshold to begin peripheral Levophed, PCCM asked to admit -Patient is awake and alert, in no distress acute distress -On room air -Complains of groin/flank/perineal pain ~no signs of infection to the groin, genitals, or perineum ~CT renal stone study yesterday only showed small renal calculi, no hydronephrosis, otherwise negative -Blood cultures from yesterday with group B strep ~will consult ID  Objective   Blood pressure 115/83, pulse 94, temperature 98.6 F (37 C), temperature source Oral, resp. rate 18, height 5\' 6"  (1.676 m), weight 99.8 kg, SpO2 97 %.        Intake/Output Summary (Last 24 hours) at 12/02/2022 1456 Last data filed at 12/02/2022 1610 Gross per 24 hour  Intake 130.89 ml  Output 1100 ml  Net -969.11 ml   Filed Weights   12/01/22 1305  Weight: 99.8 kg    Examination: General: Acute on chronically ill-appearing obese male, laying in bed, on room air, no acute distress HENT: Atraumatic, normocephalic, neck supple, no JVD Lungs: Clear breath sounds bilaterally, no wheezing or rales noted, even, nonlabored Cardiovascular: Regular rate and rhythm, S1-S2, no murmurs, rubs, gallops Abdomen: Obese, soft, nontender, no guarding or rebound tenderness, bowel sounds positive x 4 Extremities: Normal bulk and tone, no edema, wound to left great toe with  erythema and warmth, but does not appear grossly infected Neuro: Awake and alert, oriented x 4, follows commands, no focal deficits, speech clear, pupils PERRLA GU: Deferred      Resolved Hospital Problem list     Assessment & Plan:   #Hypotension: Hypovolemic + Septic Shock #Chronic HFpEF without acute exacerbation PMHx: HFpEF, CAD, HTN, HLD Echocardiogram from 03/27/2018: EF greater than 55%, grade 1 diastolic dysfunction -Continuous cardiac monitoring -Maintain MAP >65 -Gentle IV fluids -Vasopressors as needed to maintain MAP goal -Start midodrine -Lactic acid has normalized (2.6 ~ 3.2 ~ 2.4 ~ 1.7) -HS Troponin peaked at 7 -Repeat Echocardiogram pending -Check cortisol -Hold home propranolol  #Severe Sepsis due to Group B Strep BACTEREMIA #Left great toe wound X-ray of the left great toe on 6/14 without evidence of osteomyelitis CXR without evidence of pneumonia CT Abdomen/Renal Stone  study with tiny bilateral renal calculi, otherwise no acute intraabdominal finding -Monitor fever curve -Trend WBC's & Procalcitonin -Follow cultures as above -ID consulted, appreciate input ~ will defer ABX to ID ~ Continue empiric Unasyn pending cultures & sensitivities -May need CT vs MRI of left great toe -Consider Podiatry & wound care consults  #Acute Kidney Injury #Bilateral Renal Calculi CT Renal Stone study 6/13 with tiny bilateral renal calculi, no evidence of ureteral calculi, no hydronephrosis -Monitor I&O's / urinary output -Follow BMP -Ensure adequate renal perfusion -Avoid nephrotoxic agents as able -Replace electrolytes as indicated -IV fluids & Pain control ~ pt requesting Tamsulosin, will initiate  #Diabetes Mellitus -CBG's ac & hs; Target range of 140 to 180 -SSI -Follow ICU Hypo/Hyperglycemia protocol -Check Hgb A1c     Best Practice (right click and "Reselect all SmartList Selections" daily)   Diet/type: Regular consistency (see orders) DVT  prophylaxis: prophylactic heparin  GI prophylaxis: PPI Lines: N/A Foley:  N/A Code Status:  full code Last date of multidisciplinary goals of care discussion [N/A]  6/14: Pt and his mother updated at bedside on plan of care.  Labs   CBC: Recent Labs  Lab 11/30/22 1244 12/01/22 0956 12/02/22 0450  WBC 17.2* 13.9* 8.5  NEUTROABS  --  8.9*  --   HGB 16.6 15.2 13.1  HCT 46.6 44.8 39.0  MCV 88.9 92.6 93.8  PLT 245 194 172     Basic Metabolic Panel: Recent Labs  Lab 11/30/22 1244 11/30/22 1500 12/01/22 0956 12/02/22 0450  NA 126* 130* 130* 132*  K 3.1* 3.0* 3.9 3.3*  CL 88* 94* 95* 99  CO2 22 22 22  21*  GLUCOSE 351* 92 231* 175*  BUN 19 18 24* 13  CREATININE 1.66* 1.42* 2.08* 1.06  CALCIUM 9.7 8.6* 8.7* 8.1*  MG  --   --   --  1.2*  PHOS  --   --   --  3.0    GFR: Estimated Creatinine Clearance: 90.2 mL/min (by C-G formula based on SCr of 1.06 mg/dL). Recent Labs  Lab 11/30/22 1244 11/30/22 1500 12/01/22 0956 12/01/22 1203 12/01/22 1258 12/01/22 1310 12/02/22 0450  PROCALCITON  --   --   --   --  0.41  --  0.23  WBC 17.2*  --  13.9*  --   --   --  8.5  LATICACIDVEN 3.0*   < > 2.6* 3.2* 2.4* 1.7  --    < > = values in this interval not displayed.     Liver Function Tests: Recent Labs  Lab 12/01/22 0956 12/02/22 0450  AST 42*  --   ALT 21  --   ALKPHOS 126  --   BILITOT 0.9  --   PROT 7.5  --   ALBUMIN 3.5 2.8*    No results for input(s): "LIPASE", "AMYLASE" in the last 168 hours. No results for input(s): "AMMONIA" in the last 168 hours.  ABG No results found for: "PHART", "PCO2ART", "PO2ART", "HCO3", "TCO2", "ACIDBASEDEF", "O2SAT"   Coagulation Profile: Recent Labs  Lab 12/01/22 0956  INR 1.0     Cardiac Enzymes: No results for input(s): "CKTOTAL", "CKMB", "CKMBINDEX", "TROPONINI" in the last 168 hours.  HbA1C: Hgb A1c MFr Bld  Date/Time Value Ref Range Status  11/15/2016 05:03 AM 11.0 (H) 4.8 - 5.6 % Final    Comment:     (NOTE)         Pre-diabetes: 5.7 - 6.4  Diabetes: >6.4         Glycemic control for adults with diabetes: <7.0   09/26/2015 01:23 AM 16.0 (H) 4.0 - 6.0 % Final    CBG: Recent Labs  Lab 11/30/22 1321 12/01/22 1644 12/01/22 2115 12/02/22 0745 12/02/22 1129  GLUCAP 247* 168* 237* 244* 241*     Review of Systems:   Positives in BOLD: Gen: Denies fever, chills, weight change, fatigue, night sweats HEENT: Denies blurred vision, double vision, hearing loss, tinnitus, sinus congestion, rhinorrhea, sore throat, neck stiffness, dysphagia PULM: Denies shortness of breath, cough, sputum production, hemoptysis, wheezing CV: Denies chest pain, edema, orthopnea, paroxysmal nocturnal dyspnea, palpitations GI: Denies abdominal pain, nausea, vomiting, diarrhea, hematochezia, melena, constipation, change in bowel habits GU: Denies dysuria, hematuria, polyuria, oliguria, urethral discharge Endocrine: Denies hot or cold intolerance, polyuria, polyphagia or appetite change Derm: Denies rash, dry skin, scaling or peeling skin change Heme: Denies easy bruising, bleeding, bleeding gums Neuro: Denies headache, numbness, weakness, slurred speech, loss of memory or consciousness   Past Medical History:  He,  has a past medical history of Anxiety, Asthma, CHF (congestive heart failure) (HCC), Depressed, Diabetes mellitus without complication (HCC), Diastolic heart failure (HCC), GERD (gastroesophageal reflux disease), Heartburn, HLD (hyperlipidemia), Hypertension, Kidney stone, Migraines, Neuropathy, and Sleep apnea.   Surgical History:   Past Surgical History:  Procedure Laterality Date   CYSTOSCOPY W/ URETERAL STENT REMOVAL N/A 05/27/2016   Procedure: CYSTOSCOPY WITH STENT REMOVAL;  Surgeon: Marin Olp, MD;  Location: [facility] ORS;  Service: Urology;  Laterality: N/A;   CYSTOSCOPY WITH STENT PLACEMENT Left 05/05/2016   Procedure: CYSTOSCOPY WITH STENT PLACEMENT;  Surgeon: Vanna Scotland, MD;   Location: [facility] ORS;  Service: Urology;  Laterality: Left;   HERNIA REPAIR     KNEE SURGERY Left 2009     Social History:   reports that he quit smoking about 22 years ago. His smoking use included cigarettes. He has a 5.00 pack-year smoking history. He has never used smokeless tobacco. He reports current drug use. Drug: Marijuana. He reports that he does not drink alcohol.   Family History:  His family history includes Cancer in his father; Dementia in his paternal aunt; Diabetes Mellitus II in his father; High blood pressure in his mother; Hypertension in his brother and mother; Multiple myeloma in his father. There is no history of Prostate cancer, Kidney disease, or Bladder Cancer.   Allergies No Known Allergies   Home Medications  Prior to Admission medications   Medication Sig Start Date End Date Taking? Authorizing Provider  atorvastatin (LIPITOR) 80 MG tablet TAKE ONE TABLET BY MOUTH AT BEDTIME FOR HIGH CHOLESTEROL 03/16/21 12/01/22 Yes   DULoxetine (CYMBALTA) 30 MG capsule TAKE 3 CAPSULES BY MOUTH ONCE DAILY FOR MOOD AND PAIN. 07/19/21  Yes   empagliflozin (JARDIANCE) 25 MG TABS tablet TAKE 1 TABLET BY MOUTH ONCE DAILY FOR DIABETES. 07/19/21  Yes   gabapentin (NEURONTIN) 400 MG capsule TAKE 3 CAPSULES BY MOUTH EVERY MORNING AND AFTERNOON AND TAKE 4 CAPSULES BY MOUTH AT BEDTIME. 07/06/21  Yes   lisinopril (ZESTRIL) 2.5 MG tablet Take 2.5 mg by mouth daily. 11/20/22  Yes [provider]  propranolol (INDERAL) 80 MG tablet Take 80 mg by mouth 2 (two) times daily. 11/20/22  Yes [provider]  tiZANidine (ZANAFLEX) 4 MG tablet TAKE 1 TABLET BY MOUTH 4 TIMES DAILY AS NEEDED FOR MUSCLE SPASTICITY. 07/19/21  Yes   albuterol (PROAIR HFA) 108 (90 Base) MCG/ACT inhaler Inhale 2 puffs into  the lungs once every 4 to 6 hours as needed for wheezing. Replaces Ventolin HFA. Patient not taking: Reported on 12/01/2022 08/14/21     aspirin 81 MG EC tablet TAKE 1 TABLET BY MOUTH ONCE DAILY FOR  HEART PROTECTION. 07/19/21     aspirin-acetaminophen-caffeine (EXCEDRIN MIGRAINE) 250-250-65 MG tablet Take 2 tablets by mouth every 6 (six) hours as needed for headache.    [provider]  busPIRone (BUSPAR) 15 MG tablet TAKE 1 TABLET BY MOUTH TWICE A DAY FOR MOOD. 07/20/21     furosemide (LASIX) 20 MG tablet TAKE 1 TABLET BY MOUTH ONCE DAILY FOR FLUID RETENTION. 07/19/21     glucose blood (RIGHTEST GS550 BLOOD GLUCOSE) test strip Use as directed 02/07/21   Beryl Meager K, RPH  insulin degludec (TRESIBA FLEXTOUCH) 200 UNIT/ML FlexTouch Pen INJECT 160 UNITS UNDER THE SKIN ONCE DAILY. Patient taking differently: Inject 160 Units into the skin. Patient reports taking 120-160 units. 06/02/21     Insulin Pen Needle (NOVOFINE PEN NEEDLE) 32G X 6 MM MISC USE AS DIRECTED 03/15/21   Beryl Meager K, RPH  metFORMIN (GLUCOPHAGE) 1000 MG tablet TAKE ONE TABLET BY MOUTH 2 TIMES A DAY FOR DIABETES 03/16/21 03/16/22    NOVOLOG 100 UNIT/ML injection INJECT 45 UNITS UNDER THE SKIN EVERY MORNING. THEN 40 UNITS AT LUNCH. THEN 40 UNITS AT DINNER. Patient not taking: Reported on 06/22/2021 07/22/20 07/22/21  Karie Fetch A, MD  omeprazole (PRILOSEC) 20 MG capsule TAKE 1 CAPSULE BY MOUTH ONCE DAILY FOR ACID REFLUX. 07/19/21     QUEtiapine (SEROQUEL) 25 MG tablet TAKE ONE TABLET BY MOUTH ONCE NIGHTLY. 05/05/21     Rightest GL300 Lancets MISC Use as directed 02/07/21   Skipper Cliche, [facility]  spironolactone (ALDACTONE) 25 MG tablet TAKE (1/2) TABLET (12.5MG  TOTAL) BY MOUTH ONCE DAILY. 08/03/21     VENTOLIN HFA 108 (90 Base) MCG/ACT inhaler INHALE 2 PUFFS EVERY 4 TO 6 HOURS AS NEEDED FOR WHEEZING. 12/21/20 12/21/21        Vida Rigger, M.D.  Pulmonary & Critical Care Medicine  [facility]

## 2022-12-02 NOTE — ED Notes (Addendum)
Pt stood at side of bed without assistance to urinate, Pts HR on the monitor increased to 140-145s. After Pt settled back into bed his HR returned to baseline 90-100s. MD notified via secure chat as this is the 2nd occurrence of this happening

## 2022-12-03 DIAGNOSIS — A419 Sepsis, unspecified organism: Secondary | ICD-10-CM

## 2022-12-03 DIAGNOSIS — R6520 Severe sepsis without septic shock: Secondary | ICD-10-CM

## 2022-12-03 LAB — PROCALCITONIN: Procalcitonin: 0.11 ng/mL

## 2022-12-03 LAB — CBC
HCT: 38 % — ABNORMAL LOW (ref 39.0–52.0)
Hemoglobin: 12.9 g/dL — ABNORMAL LOW (ref 13.0–17.0)
MCH: 31.7 pg (ref 26.0–34.0)
MCHC: 33.9 g/dL (ref 30.0–36.0)
MCV: 93.4 fL (ref 80.0–100.0)
Platelets: 201 10*3/uL (ref 150–400)
RBC: 4.07 MIL/uL — ABNORMAL LOW (ref 4.22–5.81)
RDW: 11.9 % (ref 11.5–15.5)
WBC: 7.7 10*3/uL (ref 4.0–10.5)
nRBC: 0 % (ref 0.0–0.2)

## 2022-12-03 LAB — GLUCOSE, CAPILLARY
Glucose-Capillary: 190 mg/dL — ABNORMAL HIGH (ref 70–99)
Glucose-Capillary: 175 mg/dL — ABNORMAL HIGH (ref 70–99)
Glucose-Capillary: 259 mg/dL — ABNORMAL HIGH (ref 70–99)
Glucose-Capillary: 206 mg/dL — ABNORMAL HIGH (ref 70–99)

## 2022-12-03 LAB — CULTURE, BLOOD (ROUTINE X 2)

## 2022-12-03 LAB — RENAL FUNCTION PANEL
Albumin: 3 g/dL — ABNORMAL LOW (ref 3.5–5.0)
Anion gap: 18 — ABNORMAL HIGH (ref 5–15)
BUN: 11 mg/dL (ref 6–20)
CO2: 18 mmol/L — ABNORMAL LOW (ref 22–32)
Calcium: 8.6 mg/dL — ABNORMAL LOW (ref 8.9–10.3)
Chloride: 97 mmol/L — ABNORMAL LOW (ref 98–111)
Creatinine, Ser: 1.03 mg/dL (ref 0.61–1.24)
GFR, Estimated: >60 mL/min (ref >60)
Glucose, Bld: 189 mg/dL — ABNORMAL HIGH (ref 70–99)
Phosphorus: 4.1 mg/dL (ref 2.5–4.6)
Potassium: 3.5 mmol/L (ref 3.5–5.1)
Sodium: 133 mmol/L — ABNORMAL LOW (ref 135–145)

## 2022-12-03 LAB — CULTURE, BLOOD (SINGLE): Special Requests: ADEQUATE

## 2022-12-03 LAB — AEROBIC CULTURE W GRAM STAIN (SUPERFICIAL SPECIMEN)

## 2022-12-03 MED ORDER — DULOXETINE HCL 30 MG PO CPEP
90.0000 mg | ORAL_CAPSULE | Freq: Every day | ORAL | Status: DC
  Administered 2022-12-03 – 2022-12-04 (×2): 90 mg via ORAL
  Filled 2022-12-03 (×2): qty 3

## 2022-12-03 MED ORDER — BUSPIRONE HCL 10 MG PO TABS
15.0000 mg | ORAL_TABLET | Freq: Two times a day (BID) | ORAL | Status: DC
  Administered 2022-12-03 – 2022-12-04 (×3): 15 mg via ORAL
  Filled 2022-12-03 (×3): qty 2

## 2022-12-03 MED ORDER — FUROSEMIDE 20 MG PO TABS
20.0000 mg | ORAL_TABLET | Freq: Every day | ORAL | Status: DC
  Administered 2022-12-03 – 2022-12-04 (×2): 20 mg via ORAL
  Filled 2022-12-03 (×2): qty 1

## 2022-12-03 MED ORDER — SPIRONOLACTONE 12.5 MG HALF TABLET
12.5000 mg | ORAL_TABLET | Freq: Every day | ORAL | Status: DC
  Administered 2022-12-03 – 2022-12-04 (×2): 12.5 mg via ORAL
  Filled 2022-12-03 (×2): qty 1

## 2022-12-03 MED ORDER — ENOXAPARIN SODIUM 60 MG/0.6ML IJ SOSY
0.5000 mg/kg | PREFILLED_SYRINGE | INTRAMUSCULAR | Status: DC
  Administered 2022-12-03: 52.5 mg via SUBCUTANEOUS
  Filled 2022-12-03: qty 0.6

## 2022-12-03 MED ORDER — LISINOPRIL 5 MG PO TABS
2.5000 mg | ORAL_TABLET | Freq: Every day | ORAL | Status: DC
  Administered 2022-12-03 – 2022-12-04 (×2): 2.5 mg via ORAL
  Filled 2022-12-03 (×2): qty 1

## 2022-12-03 NOTE — Progress Notes (Signed)
Mobility Specialist - Progress Note   12/03/22 0956  Mobility  Activity Ambulated independently in room;Stood at bedside;Dangled on edge of bed  Level of Assistance Independent  Assistive Device None  Distance Ambulated (ft) 5 ft  Activity Response Tolerated well  Mobility Referral Yes  $Mobility charge 1 Mobility  Mobility Specialist Start Time (ACUTE ONLY) 0910  Mobility Specialist Stop Time (ACUTE ONLY) [id number]  Mobility Specialist Time Calculation (min) (ACUTE ONLY) 8 min   Author responds to bed alarm. Pt getting OOB to go to sink. Pt ambulates to/from sink indep. Pt returns to bed with needs in reach and bed alarm activated.   [name]  Mobility Specialist  12/03/22 9:57 AM

## 2022-12-03 NOTE — Progress Notes (Signed)
PROGRESS NOTE    Dustin Williamson  ZOX:[id number] DOB: August 24, 1970 DOA: 12/01/2022 PCP: Emogene Morgan, MD    Brief Narrative:  52 year old male with a past medical history significant for hypertension, HFpEF, GERD, hyperlipidemia, and diabetes mellitus who presents to [facility] ED on 12/01/2022 due to being urged to come to the ED following positive blood cultures from yesterday 11/30/2022, along with complaints of low blood pressure, fever of 102 F, flank pain, groin and perineal pain.  Patient is admitted initially admitted to critical care service for severe sepsis, group B strep bacteremia.  Patient was hypotensive, started on IV fluids, midodrine therapy did improve.  Patient's level of care downgraded to telemetry floor, transferred to hospitalist service.    Assessment & Plan:   Principal Problem:   Severe sepsis (HCC) Active Problems:   Bacteremia   GBS (group B streptococcus) infection   Sepsis due to group B Streptococcus (HCC)   Chronic diastolic CHF (congestive heart failure) (HCC)  Septic shock Group B strep bacteremia Left great toe infection Shock resolved IV vasopressors not required Plan: Discontinue midodrine Discontinue telemetry Continue Unasyn for now ID follow-up in a.m.  Acute kidney injury in the setting of sepsis, dehydration. 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 Continue Accu-Cheks and sliding scale insulin.   Chronic diastolic CHF Restart Lasix, lisinopril, Aldactone Patient euvolemic He is euvolemic. Caution with IV fluids. Can discontinue telemetry   Anxiety/ depression Continue seroquel.  Morbid obesity Meets criteria with BMI greater than 35 in the setting of multiple chronic  comorbidities.  This complicates overall care and prognosis.   DVT prophylaxis: SQ Lovenox Code Status: Full Family Communication: None Disposition Plan: Status is: Inpatient Remains inpatient appropriate because: Resolving septic shock.  Group B strep bacteremia on IV antibiotics.   Level of care: Med-Surg  Consultants:  ID  Procedures:  None  Antimicrobials: Unasyn   Subjective: Seen and examined.  Resting in bed.  No visible distress  Objective: Vitals:   12/02/22 2009 12/03/22 0456 12/03/22 0500 12/03/22 0820  BP: (!) 126/91 (!) 129/95  (!) 141/91  Pulse: 79 90  (!) 102  Resp: 12 12  16   Temp: 98.3 F (36.8 C) (!) 97.3 F (36.3 C)  98.7 F (37.1 C)  TempSrc:      SpO2: 100% 97%  97%  Weight:   102.6 kg   Height:        Intake/Output Summary (Last 24 hours) at 12/03/2022 1109 Last data filed at 12/03/2022 1049 Gross per 24 hour  Intake 666.67 ml  Output --  Net 666.67 ml   Filed Weights   12/01/22 1305 12/03/22 0500  Weight: 99.8 kg 102.6 kg    Examination:  General exam: Appears calm and comfortable  Respiratory system: Clear to auscultation. Respiratory effort normal. Cardiovascular system: S1-S2, tachycardic, regular rhythm, no murmurs, no pedal edema Gastrointestinal system: Soft, NT/ND, normal bowel sounds Central nervous system: Alert and oriented. No focal neurological deficits. Extremities: Symmetric 5 x 5 power. Skin: Left great toe wound Psychiatry: Judgement and insight appear normal. Mood & affect appropriate.     Data Reviewed: I have personally reviewed following labs and imaging studies  CBC: Recent Labs  Lab 11/30/22 1244 12/01/22 0956 12/02/22 0450 12/03/22 0439  WBC 17.2* 13.9*  8.5 7.7  NEUTROABS  --  8.9*  --   --   HGB 16.6 15.2 13.1 12.9*  HCT 46.6 44.8 39.0 38.0*  MCV 88.9 92.6 93.8 93.4  PLT 245 194 172 201   Basic Metabolic Panel: Recent Labs  Lab 11/30/22 1244 11/30/22 1500 12/01/22 0956 12/02/22 0450  12/03/22 0439  NA 126* 130* 130* 132* 133*  K 3.1* 3.0* 3.9 3.3* 3.5  CL 88* 94* 95* 99 97*  CO2 22 22 22  21* 18*  GLUCOSE 351* 92 231* 175* 189*  BUN 19 18 24* 13 11  CREATININE 1.66* 1.42* 2.08* 1.06 1.03  CALCIUM 9.7 8.6* 8.7* 8.1* 8.6*  MG  --   --   --  1.2*  --   PHOS  --   --   --  3.0 4.1   GFR: Estimated Creatinine Clearance: 94.1 mL/min (by C-G formula based on SCr of 1.03 mg/dL). Liver Function Tests: Recent Labs  Lab 12/01/22 0956 12/02/22 0450 12/03/22 0439  AST 42*  --   --   ALT 21  --   --   ALKPHOS 126  --   --   BILITOT 0.9  --   --   PROT 7.5  --   --   ALBUMIN 3.5 2.8* 3.0*   No results for input(s): "LIPASE", "AMYLASE" in the last 168 hours. No results for input(s): "AMMONIA" in the last 168 hours. Coagulation Profile: Recent Labs  Lab 12/01/22 0956  INR 1.0   Cardiac Enzymes: No results for input(s): "CKTOTAL", "CKMB", "CKMBINDEX", "TROPONINI" in the last 168 hours. BNP (last 3 results) No results for input(s): "PROBNP" in the last 8760 hours. HbA1C: No results for input(s): "HGBA1C" in the last 72 hours. CBG: Recent Labs  Lab 12/02/22 0745 12/02/22 1129 12/02/22 1616 12/02/22 2122 12/03/22 0821  GLUCAP 244* 241* 186* 181* 190*   Lipid Profile: No results for input(s): "CHOL", "HDL", "LDLCALC", "TRIG", "CHOLHDL", "LDLDIRECT" in the last 72 hours. Thyroid Function Tests: No results for input(s): "TSH", "T4TOTAL", "FREET4", "T3FREE", "THYROIDAB" in the last 72 hours. Anemia Panel: No results for input(s): "VITAMINB12", "FOLATE", "FERRITIN", "TIBC", "IRON", "RETICCTPCT" in the last 72 hours. Sepsis Labs: Recent Labs  Lab 12/01/22 0956 12/01/22 1203 12/01/22 1258 12/01/22 1310 12/02/22 0450 12/03/22 0439  PROCALCITON  --   --  0.41  --  0.23 0.11  LATICACIDVEN 2.6* 3.2* 2.4* 1.7  --   --     Recent Results (from the past 240 hour(s))  Blood culture (single)     Status: Abnormal   Collection Time: 11/30/22 12:44 PM   Specimen:  BLOOD  Result Value Ref Range Status   Specimen Description   Final    BLOOD Performed at [facility], [address] [place], [place] [address]    Special Requests   Final    BOTTLES DRAWN AEROBIC AND ANAEROBIC Blood Culture adequate volume Performed at [facility], [address] [place], [place] [address]    Culture  Setup Time   Final    GRAM POSITIVE COCCI AEROBIC BOTTLE ONLY CRITICAL RESULT CALLED TO, READ BACK BY AND VERIFIED WITH: ASHLEY ORSUOTO AT 0131 12/01/22 BY SKL Performed at [facility], [address] [place], [place] [address]    Culture GROUP B STREP(S.AGALACTIAE)ISOLATED (A)  Final   Report Status 12/03/2022 FINAL  Final   Organism ID, Bacteria GROUP B STREP(S.AGALACTIAE)ISOLATED  Final      Susceptibility   Group b strep(s.agalactiae)isolated - MIC*  CLINDAMYCIN <=0.25 SENSITIVE Sensitive     AMPICILLIN <=0.25 SENSITIVE Sensitive     ERYTHROMYCIN <=0.12 SENSITIVE Sensitive     VANCOMYCIN 0.5 SENSITIVE Sensitive     CEFTRIAXONE <=0.12 SENSITIVE Sensitive     LEVOFLOXACIN 0.5 SENSITIVE Sensitive     PENICILLIN <=0.06 SENSITIVE Sensitive     * GROUP B STREP(S.AGALACTIAE)ISOLATED  Blood Culture ID Panel (Reflexed)     Status: Abnormal   Collection Time: 11/30/22 12:44 PM  Result Value Ref Range Status   Enterococcus faecalis NOT DETECTED NOT DETECTED Final   Enterococcus Faecium NOT DETECTED NOT DETECTED Final   Listeria monocytogenes NOT DETECTED NOT DETECTED Final   Staphylococcus species NOT DETECTED NOT DETECTED Final   Staphylococcus aureus (BCID) NOT DETECTED NOT DETECTED Final   Staphylococcus epidermidis NOT DETECTED NOT DETECTED Final   Staphylococcus lugdunensis NOT DETECTED NOT DETECTED Final   Streptococcus species DETECTED (A) NOT DETECTED Final    Comment: CRITICAL RESULT CALLED TO, READ BACK BY AND VERIFIED WITH: ASHLEY ORSUOTO @0131  ON 12/01/22 SKL    Streptococcus agalactiae DETECTED (A) NOT DETECTED Final     Comment: CRITICAL RESULT CALLED TO, READ BACK BY AND VERIFIED WITH: ASHLEY ORSUOTO @0131  ON 12/01/22 SKL    Streptococcus pneumoniae NOT DETECTED NOT DETECTED Final   Streptococcus pyogenes NOT DETECTED NOT DETECTED Final   A.calcoaceticus-baumannii NOT DETECTED NOT DETECTED Final   Bacteroides fragilis NOT DETECTED NOT DETECTED Final   Enterobacterales NOT DETECTED NOT DETECTED Final   Enterobacter cloacae complex NOT DETECTED NOT DETECTED Final   Escherichia coli NOT DETECTED NOT DETECTED Final   Klebsiella aerogenes NOT DETECTED NOT DETECTED Final   Klebsiella oxytoca NOT DETECTED NOT DETECTED Final   Klebsiella pneumoniae NOT DETECTED NOT DETECTED Final   Proteus species NOT DETECTED NOT DETECTED Final   Salmonella species NOT DETECTED NOT DETECTED Final   Serratia marcescens NOT DETECTED NOT DETECTED Final   Haemophilus influenzae NOT DETECTED NOT DETECTED Final   Neisseria meningitidis NOT DETECTED NOT DETECTED Final   Pseudomonas aeruginosa NOT DETECTED NOT DETECTED Final   Stenotrophomonas maltophilia NOT DETECTED NOT DETECTED Final   Candida albicans NOT DETECTED NOT DETECTED Final   Candida auris NOT DETECTED NOT DETECTED Final   Candida glabrata NOT DETECTED NOT DETECTED Final   Candida krusei NOT DETECTED NOT DETECTED Final   Candida parapsilosis NOT DETECTED NOT DETECTED Final   Candida tropicalis NOT DETECTED NOT DETECTED Final   Cryptococcus neoformans/gattii NOT DETECTED NOT DETECTED Final    Comment: Performed at [facility], [address] [place], [place] [address]  Resp panel by RT-PCR (RSV, Flu A&B, Covid) Anterior Nasal Swab     Status: None   Collection Time: 12/01/22  9:56 AM   Specimen: Anterior Nasal Swab  Result Value Ref Range Status   SARS Coronavirus 2 by RT PCR NEGATIVE NEGATIVE Final    Comment: (NOTE) SARS-CoV-2 target nucleic acids are NOT DETECTED.  The SARS-CoV-2 RNA is generally detectable in upper respiratory specimens during the  acute phase of infection. The lowest concentration of SARS-CoV-2 viral copies this assay can detect is 138 copies/mL. A negative result does not preclude SARS-Cov-2 infection and should not be used as the sole basis for treatment or other patient management decisions. A negative result may occur with  improper specimen collection/handling, submission of specimen other than nasopharyngeal swab, presence of viral mutation(s) within the areas targeted by this assay, and inadequate number of viral copies(<138 copies/mL). A negative result must be combined with  clinical observations, patient history, and epidemiological information. The expected result is Negative.  Fact Sheet for Patients:  BloggerCourse.com  Fact Sheet for Healthcare Providers:  SeriousBroker.it  This test is no t yet approved or cleared by the [place] FDA and  has been authorized for detection and/or diagnosis of SARS-CoV-2 by FDA under an Emergency Use Authorization (EUA). This EUA will remain  in effect (meaning this test can be used) for the duration of the COVID-19 declaration under Section 564(b)(1) of the Act, 21 U.S.C.section 360bbb-3(b)(1), unless the authorization is terminated  or revoked sooner.       Influenza A by PCR NEGATIVE NEGATIVE Final   Influenza B by PCR NEGATIVE NEGATIVE Final    Comment: (NOTE) The Xpert Xpress SARS-CoV-2/FLU/RSV plus assay is intended as an aid in the diagnosis of influenza from Nasopharyngeal swab specimens and should not be used as a sole basis for treatment. Nasal washings and aspirates are unacceptable for Xpert Xpress SARS-CoV-2/FLU/RSV testing.  Fact Sheet for Patients: BloggerCourse.com  Fact Sheet for Healthcare Providers: SeriousBroker.it  This test is not yet approved or cleared by the [place] FDA and has been authorized for detection and/or  diagnosis of SARS-CoV-2 by FDA under an Emergency Use Authorization (EUA). This EUA will remain in effect (meaning this test can be used) for the duration of the COVID-19 declaration under Section 564(b)(1) of the Act, 21 U.S.C. section 360bbb-3(b)(1), unless the authorization is terminated or revoked.     Resp Syncytial Virus by PCR NEGATIVE NEGATIVE Final    Comment: (NOTE) Fact Sheet for Patients: BloggerCourse.com  Fact Sheet for Healthcare Providers: SeriousBroker.it  This test is not yet approved or cleared by the [place] FDA and has been authorized for detection and/or diagnosis of SARS-CoV-2 by FDA under an Emergency Use Authorization (EUA). This EUA will remain in effect (meaning this test can be used) for the duration of the COVID-19 declaration under Section 564(b)(1) of the Act, 21 U.S.C. section 360bbb-3(b)(1), unless the authorization is terminated or revoked.  Performed at [facility], [address] [place], [place] [address]   Blood Culture (routine x 2)     Status: None (Preliminary result)   Collection Time: 12/01/22  9:57 AM   Specimen: BLOOD  Result Value Ref Range Status   Specimen Description BLOOD BLOOD LEFT HAND  Final   Special Requests   Final    BOTTLES DRAWN AEROBIC AND ANAEROBIC Blood Culture results may not be optimal due to an inadequate volume of blood received in culture bottles   Culture   Final    NO GROWTH 2 DAYS Performed at [facility], [address] [place], [place] [address]    Report Status PENDING  Incomplete  Blood Culture (routine x 2)     Status: None (Preliminary result)   Collection Time: 12/01/22  9:58 AM   Specimen: BLOOD  Result Value Ref Range Status   Specimen Description BLOOD RIGHT ANTECUBITAL  Final   Special Requests   Final    BOTTLES DRAWN AEROBIC AND ANAEROBIC Blood Culture adequate volume   Culture   Final    NO GROWTH 2  DAYS Performed at [facility], [address] [place], [place] [address]    Report Status PENDING  Incomplete  Aerobic Culture w Gram Stain (superficial specimen)     Status: None (Preliminary result)   Collection Time: 12/01/22  1:46 PM   Specimen: Wound  Result Value Ref Range Status   Specimen  Description   Final    WOUND Performed at [facility], [address] [place], [place] [address]    Special Requests   Final    TOE Performed at [facility], [address] [place], [place] [address]    Gram Stain ABUNDANT GRAM POSITIVE COCCI NO WBC SEEN   Final   Culture   Final    ABUNDANT STAPHYLOCOCCUS AUREUS SUSCEPTIBILITIES TO FOLLOW Performed at [facility], [address] [place], [place] [address]    Report Status PENDING  Incomplete         Radiology Studies: ECHOCARDIOGRAM COMPLETE  Result Date: 12/01/2022    ECHOCARDIOGRAM REPORT   Patient Name:   MCCAIN BUCHBERGER Date of Exam: 12/01/2022 Medical Rec #:  [id number]       Height:       66.0 in Accession #:    [id number]      Weight:       220.0 lb Date of Birth:  [birth year]       BSA:          2.082 m Patient Age:    52 years        BP:           108/82 mmHg Patient Gender: M               HR:           76 bpm. Exam Location:  [facility] Procedure: 2D Echo, Cardiac Doppler and Color Doppler Indications:     Bacteremia  History:         Patient has no prior history of Echocardiogram examinations.                  Signs/Symptoms:Bacteremia; Risk Factors:Hypertension, Sleep                  Apnea and Diabetes. Sepsis.  Sonographer:     Mikki Harbor Referring Phys:  [id number] Judithe Modest Diagnosing Phys: Julien Nordmann MD IMPRESSIONS  1. Left ventricular ejection fraction, by estimation, is 60 to 65%. The left ventricle has normal function. The left ventricle has no regional wall motion abnormalities. Left ventricular diastolic parameters were normal.  2. Right ventricular systolic function  is normal. The right ventricular size is normal. Tricuspid regurgitation signal is inadequate for assessing PA pressure.  3. The mitral valve is normal in structure. No evidence of mitral valve regurgitation. No evidence of mitral stenosis.  4. The aortic valve has an indeterminant number of cusps. Aortic valve regurgitation is not visualized. No aortic stenosis is present.  5. There is borderline dilatation of the aortic root, measuring 38 mm.  6. The inferior vena cava is normal in size with greater than 50% respiratory variability, suggesting right atrial pressure of 3 mmHg. FINDINGS  Left Ventricle: Left ventricular ejection fraction, by estimation, is 60 to 65%. The left ventricle has normal function. The left ventricle has no regional wall motion abnormalities. The left ventricular internal cavity size was normal in size. There is  no left ventricular hypertrophy. Left ventricular diastolic parameters were normal. Right Ventricle: The right ventricular size is normal. No increase in right ventricular wall thickness. Right ventricular systolic function is normal. Tricuspid regurgitation signal is inadequate for assessing PA pressure. Left Atrium: Left atrial size was normal in size. Right Atrium: Right atrial size was normal in size. Pericardium: There is no evidence of pericardial effusion. Mitral Valve: The mitral valve is normal in structure.  There is mild thickening of the mitral valve leaflet(s). No evidence of mitral valve regurgitation. No evidence of mitral valve stenosis. MV peak gradient, 4.3 mmHg. The mean mitral valve gradient is  2.0 mmHg. Tricuspid Valve: The tricuspid valve is normal in structure. Tricuspid valve regurgitation is not demonstrated. No evidence of tricuspid stenosis. Aortic Valve: The aortic valve has an indeterminant number of cusps. Aortic valve regurgitation is not visualized. No aortic stenosis is present. Aortic valve mean gradient measures 4.0 mmHg. Aortic valve peak gradient  measures 7.5 mmHg. Aortic valve area, by VTI measures 2.48 cm. Pulmonic Valve: The pulmonic valve was normal in structure. Pulmonic valve regurgitation is not visualized. No evidence of pulmonic stenosis. Aorta: The aortic root is normal in size and structure. There is borderline dilatation of the aortic root, measuring 38 mm. Venous: The inferior vena cava is normal in size with greater than 50% respiratory variability, suggesting right atrial pressure of 3 mmHg. IAS/Shunts: No atrial level shunt detected by color flow Doppler.  LEFT VENTRICLE PLAX 2D LVIDd:         5.00 cm   Diastology LVIDs:         3.20 cm   LV e' medial:    7.51 cm/s LV PW:         1.30 cm   LV E/e' medial:  14.8 LV IVS:        1.20 cm   LV e' lateral:   11.90 cm/s LVOT diam:     2.00 cm   LV E/e' lateral: 9.3 LV SV:         65 LV SV Index:   31 LVOT Area:     3.14 cm  RIGHT VENTRICLE RV Basal diam:  3.70 cm RV Mid diam:    4.30 cm RV S prime:     12.50 cm/s TAPSE (M-mode): 2.5 cm LEFT ATRIUM             Index        RIGHT ATRIUM           Index LA diam:        3.70 cm 1.78 cm/m   RA Area:     14.90 cm LA Vol (A2C):   50.3 ml 24.15 ml/m  RA Volume:   38.00 ml  18.25 ml/m LA Vol (A4C):   44.9 ml 21.56 ml/m LA Biplane Vol: 50.1 ml 24.06 ml/m  AORTIC VALVE                    PULMONIC VALVE AV Area (Vmax):    2.50 cm     PV Vmax:       0.87 m/s AV Area (Vmean):   2.48 cm     PV Peak grad:  3.0 mmHg AV Area (VTI):     2.48 cm AV Vmax:           137.00 cm/s AV Vmean:          84.500 cm/s AV VTI:            0.264 m AV Peak Grad:      7.5 mmHg AV Mean Grad:      4.0 mmHg LVOT Vmax:         109.00 cm/s LVOT Vmean:        66.600 cm/s LVOT VTI:          0.208 m LVOT/AV VTI ratio: 0.79  AORTA Ao Root diam: 3.80 cm MITRAL VALVE MV Area (PHT):  3.11 cm     SHUNTS MV Area VTI:   2.22 cm     Systemic VTI:  0.21 m MV Peak grad:  4.3 mmHg     Systemic Diam: 2.00 cm MV Mean grad:  2.0 mmHg MV Vmax:       1.04 m/s MV Vmean:      58.2 cm/s MV Decel  Time: 244 msec MV E velocity: 111.00 cm/s MV A velocity: 83.30 cm/s MV E/A ratio:  1.33 Julien Nordmann MD Electronically signed by Julien Nordmann MD Signature Date/Time: 12/01/2022/4:28:15 PM    Final (Updated)         Scheduled Meds:  aspirin EC  81 mg Oral Daily   gabapentin  400 mg Oral TID   heparin  5,000 Units Subcutaneous Q8H   insulin aspart  0-15 Units Subcutaneous TID WC   insulin aspart  0-5 Units Subcutaneous QHS   insulin glargine-yfgn  20 Units Subcutaneous Daily   pantoprazole  40 mg Oral Daily   QUEtiapine  25 mg Oral QHS   tamsulosin  0.4 mg Oral QPC supper   Continuous Infusions:  sodium chloride 250 mL (12/02/22 2251)   ampicillin-sulbactam (UNASYN) IV 3 g (12/03/22 0532)   promethazine (PHENERGAN) injection (IM or IVPB)       LOS: 2 days     Tresa Moore, MD [facility]   If 7PM-7AM, please contact night-coverage  12/03/2022, 11:09 AM

## 2022-12-04 ENCOUNTER — Other Ambulatory Visit: Payer: Self-pay

## 2022-12-04 DIAGNOSIS — I5032 Chronic diastolic (congestive) heart failure: Secondary | ICD-10-CM

## 2022-12-04 DIAGNOSIS — L089 Local infection of the skin and subcutaneous tissue, unspecified: Secondary | ICD-10-CM

## 2022-12-04 DIAGNOSIS — A4189 Other specified sepsis: Secondary | ICD-10-CM

## 2022-12-04 LAB — AEROBIC CULTURE W GRAM STAIN (SUPERFICIAL SPECIMEN)

## 2022-12-04 LAB — RENAL FUNCTION PANEL
Albumin: 3.4 g/dL — ABNORMAL LOW (ref 3.5–5.0)
Anion gap: 20 — ABNORMAL HIGH (ref 5–15)
BUN: 11 mg/dL (ref 6–20)
CO2: 16 mmol/L — ABNORMAL LOW (ref 22–32)
Calcium: 8.8 mg/dL — ABNORMAL LOW (ref 8.9–10.3)
Chloride: 97 mmol/L — ABNORMAL LOW (ref 98–111)
Creatinine, Ser: 1.2 mg/dL (ref 0.61–1.24)
GFR, Estimated: >60 mL/min (ref >60)
Glucose, Bld: 211 mg/dL — ABNORMAL HIGH (ref 70–99)
Phosphorus: 4.6 mg/dL (ref 2.5–4.6)
Potassium: 3.9 mmol/L (ref 3.5–5.1)
Sodium: 133 mmol/L — ABNORMAL LOW (ref 135–145)

## 2022-12-04 LAB — CBC
HCT: 40.1 % (ref 39.0–52.0)
Hemoglobin: 13.3 g/dL (ref 13.0–17.0)
MCH: 31.4 pg (ref 26.0–34.0)
MCHC: 33.2 g/dL (ref 30.0–36.0)
MCV: 94.6 fL (ref 80.0–100.0)
Platelets: 241 10*3/uL (ref 150–400)
RBC: 4.24 MIL/uL (ref 4.22–5.81)
RDW: 11.9 % (ref 11.5–15.5)
WBC: 7.9 10*3/uL (ref 4.0–10.5)
nRBC: 0 % (ref 0.0–0.2)

## 2022-12-04 LAB — GLUCOSE, CAPILLARY
Glucose-Capillary: 204 mg/dL — ABNORMAL HIGH (ref 70–99)
Glucose-Capillary: 268 mg/dL — ABNORMAL HIGH (ref 70–99)

## 2022-12-04 MED ORDER — TAMSULOSIN HCL 0.4 MG PO CAPS
0.4000 mg | ORAL_CAPSULE | Freq: Every day | ORAL | 0 refills | Status: DC
  Filled 2022-12-04: qty 30, 30d supply, fill #0

## 2022-12-04 MED ORDER — AMOXICILLIN-POT CLAVULANATE 875-125 MG PO TABS
1.0000 | ORAL_TABLET | Freq: Three times a day (TID) | ORAL | 0 refills | Status: AC
  Filled 2022-12-04: qty 30, 10d supply, fill #0

## 2022-12-04 NOTE — Plan of Care (Signed)

## 2022-12-04 NOTE — TOC Transition Note (Signed)
Transition of Care [facility]) - CM/SW Discharge Note   Patient Details  Name: Dustin Williamson MRN: [id number] Date of Birth: 1971-03-15  Transition of Care [facility]) CM/SW Contact:  Allena Katz, LCSW Phone Number: 12/04/2022, 2:06 PM   Clinical Narrative:   Pt uninsured. Pt currently discharging and medications being sent to [facility] outpatient pharmacy.           Patient Goals and CMS Choice      Discharge Placement                         Discharge Plan and Services Additional resources added to the After Visit Summary for                                       Social Determinants of Health (SDOH) Interventions SDOH Screenings   Food Insecurity: No Food Insecurity (12/02/2022)  Housing: Low Risk  (12/02/2022)  Transportation Needs: No Transportation Needs (12/02/2022)  Utilities: Not At Risk (12/02/2022)  Tobacco Use: Medium Risk (12/01/2022)     Readmission Risk Interventions     No data to display

## 2022-12-04 NOTE — Discharge Summary (Signed)
Physician Discharge Summary  Dustin Williamson UJW:[id number] DOB: 11/25/1970 DOA: 12/01/2022  PCP: Emogene [place], MD  Admit date: 12/01/2022 Discharge date: 12/04/2022  Admitted From: Home Disposition:  Home  Recommendations for Outpatient Follow-up:  Follow up with PCP in 1-2 weeks   Home Health:No Equipment/Devices:None   Discharge Condition:Stable  CODE STATUS:FULL  Diet recommendation: Carb mod  Brief/Interim Summary:  52 year old male with a past medical history significant for hypertension, HFpEF, GERD, hyperlipidemia, and diabetes mellitus who presents to [facility] ED on 12/01/2022 due to being urged to come to the ED following positive blood cultures from yesterday 11/30/2022, along with complaints of low blood pressure, fever of 102 F, flank pain, groin and perineal pain.  Patient is admitted initially admitted to critical care service for severe sepsis, group B strep bacteremia.  Patient was hypotensive, started on IV fluids, midodrine therapy did improve.  Patient's level of care downgraded to telemetry floor, transferred to hospitalist service.      Discharge Diagnoses:  Principal Problem:   Severe sepsis (HCC) Active Problems:   Bacteremia   GBS (group B streptococcus) infection   Sepsis due to group B Streptococcus (HCC)   Chronic diastolic CHF (congestive heart failure) (HCC)  Septic shock Group B strep bacteremia Left great toe infection Shock resolved IV vasopressors not required Plan: Patient has been off blood pressure support agents.  Unasyn discontinued.  Start Augmentin 875 3 times daily x 10 additional days.  Stable for discharge home.   Acute kidney injury in the setting of sepsis, dehydration. 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   Follow-up outpatient PCP.  Chronic diastolic CHF Restart Lasix, lisinopril, Aldactone Patient euvolemic    Anxiety/ depression Continue seroquel.   Morbid obesity Meets criteria with BMI greater than 35 in the setting of multiple chronic comorbidities.  This complicates overall care and prognosis.  Discharge Instructions  Discharge Instructions     Diet - low sodium heart healthy   Complete by: As directed    Increase activity slowly   Complete by: As directed       Allergies as of 12/04/2022   No Known Allergies      Medication List     TAKE these medications    amoxicillin-clavulanate 875-125 MG tablet Commonly known as: AUGMENTIN Take 1 tablet by mouth 3 (three) times daily for 10 days.   aspirin EC 81 MG tablet TAKE 1 TABLET BY MOUTH ONCE DAILY FOR HEART PROTECTION. What changed:  how much to take when to take this   aspirin-acetaminophen-caffeine 250-250-65 MG tablet Commonly known as: EXCEDRIN MIGRAINE Take 2 tablets by mouth every 6 (six) hours as needed for headache.   atorvastatin 80 MG tablet Commonly known as: LIPITOR TAKE ONE TABLET BY MOUTH AT BEDTIME FOR HIGH CHOLESTEROL What changed:  how much to take when to take this   busPIRone 15 MG tablet Commonly known as: BUSPAR TAKE 1 TABLET BY MOUTH TWICE A DAY FOR MOOD (TAKE 1 TABLET BY MOUTH TWICE A DAY FOR MOOD.) What changed:  how much to take how to take this when to take this   Cymbalta 30 MG capsule Generic drug: DULoxetine TAKE 3 CAPSULES BY MOUTH ONCE DAILY FOR MOOD AND PAIN. What changed:  how much to take when to take this   furosemide 20 MG  tablet Commonly known as: LASIX TAKE 1 TABLET BY MOUTH ONCE DAILY FOR FLUID RETENTION. What changed:  how much to take when to take this   gabapentin 400 MG capsule Commonly known as: NEURONTIN TAKE 3 CAPSULES BY MOUTH EVERY MORNING AND AFTERNOON AND TAKE 4 CAPSULES BY MOUTH AT BEDTIME. What changed:  how much to take when to take  this   insulin degludec 200 UNIT/ML FlexTouch Pen Commonly known as: Tresiba FlexTouch INJECT 160 UNITS UNDER THE SKIN ONCE DAILY. What changed:  when to take this additional instructions   Jardiance 25 MG Tabs tablet Generic drug: empagliflozin TAKE 1 TABLET BY MOUTH ONCE DAILY FOR DIABETES. What changed:  how much to take when to take this   lisinopril 2.5 MG tablet Commonly known as: ZESTRIL Take 2.5 mg by mouth daily.   metFORMIN 1000 MG tablet Commonly known as: GLUCOPHAGE TAKE ONE TABLET BY MOUTH 2 TIMES A DAY FOR DIABETES What changed:  how much to take when to take this   Novofine Pen Needle 32G X 6 MM Misc Generic drug: Insulin Pen Needle USE AS DIRECTED   NovoLOG 100 UNIT/ML injection Generic drug: insulin aspart INJECT 45 UNITS UNDER THE SKIN EVERY MORNING. THEN 40 UNITS AT LUNCH. THEN 40 UNITS AT DINNER. What changed:  how much to take how to take this when to take this additional instructions   omeprazole 20 MG capsule Commonly known as: PRILOSEC TAKE 1 CAPSULE BY MOUTH ONCE DAILY FOR ACID REFLUX. What changed:  how much to take when to take this   propranolol 80 MG tablet Commonly known as: INDERAL Take 80 mg by mouth 2 (two) times daily.   QUEtiapine 25 MG tablet Commonly known as: SEROQUEL TAKE ONE TABLET BY MOUTH ONCE NIGHTLY.   Rightest GL300 Lancets Misc Use as directed   Rightest GS550 Blood Glucose test strip Generic drug: glucose blood Use as directed   spironolactone 25 MG tablet Commonly known as: ALDACTONE TAKE (1/2) TABLET (12.5MG  TOTAL) BY MOUTH ONCE DAILY.   tiZANidine 4 MG tablet Commonly known as: ZANAFLEX TAKE 1 TABLET BY MOUTH 4 TIMES DAILY AS NEEDED FOR MUSCLE SPASTICITY.   Ventolin HFA 108 (90 Base) MCG/ACT inhaler Generic drug: albuterol INHALE 2 PUFFS EVERY 4 TO 6 HOURS AS NEEDED FOR WHEEZING. What changed: Another medication with the same name was removed. Continue taking this medication, and follow the  directions you see here.        No Known Allergies  Consultations: ID   Procedures/Studies: ECHOCARDIOGRAM COMPLETE  Result Date: 12/01/2022    ECHOCARDIOGRAM REPORT   Patient Name:   Dustin Williamson Date of Exam: 12/01/2022 Medical Rec #:  [id number]       Height:       66.0 in Accession #:    [id number]      Weight:       220.0 lb Date of Birth:  02/18/1971       BSA:          2.082 m Patient Age:    52 years        BP:           108/82 mmHg Patient Gender: M               HR:           76 bpm. Exam Location:  [facility] Procedure: 2D Echo, Cardiac Doppler and Color Doppler Indications:     Bacteremia  History:  Patient has no prior history of Echocardiogram examinations.                  Signs/Symptoms:Bacteremia; Risk Factors:Hypertension, Sleep                  Apnea and Diabetes. Sepsis.  Sonographer:     Mikki Harbor Referring Phys:  [id number] Judithe Modest Diagnosing Phys: Julien Nordmann MD IMPRESSIONS  1. Left ventricular ejection fraction, by estimation, is 60 to 65%. The left ventricle has normal function. The left ventricle has no regional wall motion abnormalities. Left ventricular diastolic parameters were normal.  2. Right ventricular systolic function is normal. The right ventricular size is normal. Tricuspid regurgitation signal is inadequate for assessing PA pressure.  3. The mitral valve is normal in structure. No evidence of mitral valve regurgitation. No evidence of mitral stenosis.  4. The aortic valve has an indeterminant number of cusps. Aortic valve regurgitation is not visualized. No aortic stenosis is present.  5. There is borderline dilatation of the aortic root, measuring 38 mm.  6. The inferior vena cava is normal in size with greater than 50% respiratory variability, suggesting right atrial pressure of 3 mmHg. FINDINGS  Left Ventricle: Left ventricular ejection fraction, by estimation, is 60 to 65%. The left ventricle has normal function. The left ventricle has no  regional wall motion abnormalities. The left ventricular internal cavity size was normal in size. There is  no left ventricular hypertrophy. Left ventricular diastolic parameters were normal. Right Ventricle: The right ventricular size is normal. No increase in right ventricular wall thickness. Right ventricular systolic function is normal. Tricuspid regurgitation signal is inadequate for assessing PA pressure. Left Atrium: Left atrial size was normal in size. Right Atrium: Right atrial size was normal in size. Pericardium: There is no evidence of pericardial effusion. Mitral Valve: The mitral valve is normal in structure. There is mild thickening of the mitral valve leaflet(s). No evidence of mitral valve regurgitation. No evidence of mitral valve stenosis. MV peak gradient, 4.3 mmHg. The mean mitral valve gradient is  2.0 mmHg. Tricuspid Valve: The tricuspid valve is normal in structure. Tricuspid valve regurgitation is not demonstrated. No evidence of tricuspid stenosis. Aortic Valve: The aortic valve has an indeterminant number of cusps. Aortic valve regurgitation is not visualized. No aortic stenosis is present. Aortic valve mean gradient measures 4.0 mmHg. Aortic valve peak gradient measures 7.5 mmHg. Aortic valve area, by VTI measures 2.48 cm. Pulmonic Valve: The pulmonic valve was normal in structure. Pulmonic valve regurgitation is not visualized. No evidence of pulmonic stenosis. Aorta: The aortic root is normal in size and structure. There is borderline dilatation of the aortic root, measuring 38 mm. Venous: The inferior vena cava is normal in size with greater than 50% respiratory variability, suggesting right atrial pressure of 3 mmHg. IAS/Shunts: No atrial level shunt detected by color flow Doppler.  LEFT VENTRICLE PLAX 2D LVIDd:         5.00 cm   Diastology LVIDs:         3.20 cm   LV e' medial:    7.51 cm/s LV PW:         1.30 cm   LV E/e' medial:  14.8 LV IVS:        1.20 cm   LV e' lateral:    11.90 cm/s LVOT diam:     2.00 cm   LV E/e' lateral: 9.3 LV SV:         65  LV SV Index:   31 LVOT Area:     3.14 cm  RIGHT VENTRICLE RV Basal diam:  3.70 cm RV Mid diam:    4.30 cm RV S prime:     12.50 cm/s TAPSE (M-mode): 2.5 cm LEFT ATRIUM             Index        RIGHT ATRIUM           Index LA diam:        3.70 cm 1.78 cm/m   RA Area:     14.90 cm LA Vol (A2C):   50.3 ml 24.15 ml/m  RA Volume:   38.00 ml  18.25 ml/m LA Vol (A4C):   44.9 ml 21.56 ml/m LA Biplane Vol: 50.1 ml 24.06 ml/m  AORTIC VALVE                    PULMONIC VALVE AV Area (Vmax):    2.50 cm     PV Vmax:       0.87 m/s AV Area (Vmean):   2.48 cm     PV Peak grad:  3.0 mmHg AV Area (VTI):     2.48 cm AV Vmax:           137.00 cm/s AV Vmean:          84.500 cm/s AV VTI:            0.264 m AV Peak Grad:      7.5 mmHg AV Mean Grad:      4.0 mmHg LVOT Vmax:         109.00 cm/s LVOT Vmean:        66.600 cm/s LVOT VTI:          0.208 m LVOT/AV VTI ratio: 0.79  AORTA Ao Root diam: 3.80 cm MITRAL VALVE MV Area (PHT): 3.11 cm     SHUNTS MV Area VTI:   2.22 cm     Systemic VTI:  0.21 m MV Peak grad:  4.3 mmHg     Systemic Diam: 2.00 cm MV Mean grad:  2.0 mmHg MV Vmax:       1.04 m/s MV Vmean:      58.2 cm/s MV Decel Time: 244 msec MV E velocity: 111.00 cm/s MV A velocity: 83.30 cm/s MV E/A ratio:  1.33 Julien Nordmann MD Electronically signed by Julien Nordmann MD Signature Date/Time: 12/01/2022/4:28:15 PM    Final (Updated)    DG Toe Great Left  Result Date: 12/01/2022 CLINICAL DATA:  infection EXAM: LEFT GREAT TOE COMPARISON:  None Available. FINDINGS: There is no evidence of fracture or dislocation. There is no evidence of arthropathy or other focal bone abnormality. Soft tissues are notable for subungual soft tissue thickening of the great toe. IMPRESSION: Subungual soft tissue thickening at the great toe. No definite osseous erosion to suggest osteomyelitis. No radiopaque foreign body. Electronically Signed   By: Lorenza Cambridge M.D.   On:  12/01/2022 10:38   DG Chest Port 1 View  Result Date: 12/01/2022 CLINICAL DATA:  Questionable sepsis. Evaluate for abnormality. Positive blood cultures. EXAM: PORTABLE CHEST 1 VIEW COMPARISON:  Chest radiographs 12/24/2020 and 07/22/2018 FINDINGS: Mildly decreased lung volumes. Cardiac silhouette and mediastinal contours are within normal limits. The lungs are clear. No pleural effusion pneumothorax. No acute skeletal abnormality. IMPRESSION: Mildly decreased lung volumes. No acute cardiopulmonary process. Electronically Signed   By: Neita Garnet M.D.   On: 12/01/2022 10:06   CT  Renal Stone Study  Result Date: 11/30/2022 CLINICAL DATA:  Abdominal and flank pain. Vomiting. Fever. Recent falls. EXAM: CT ABDOMEN AND PELVIS WITHOUT CONTRAST TECHNIQUE: Multidetector CT imaging of the abdomen and pelvis was performed following the standard protocol without IV contrast. RADIATION DOSE REDUCTION: This exam was performed according to the departmental dose-optimization program which includes automated exposure control, adjustment of the mA and/or kV according to patient size and/or use of iterative reconstruction technique. COMPARISON:  08/05/2020 FINDINGS: Lower chest: No acute findings. Hepatobiliary: No mass visualized on this unenhanced exam. Gallbladder is unremarkable. No evidence of biliary ductal dilatation. Pancreas: No mass or inflammatory process visualized on this unenhanced exam. Spleen:  Within normal limits in size. Adrenals/Urinary tract: Several tiny 1-2 mm renal calculi are seen bilaterally. No evidence of ureteral calculi or hydronephrosis. Unremarkable unopacified urinary bladder. Stomach/Bowel: No evidence of obstruction, inflammatory process, or abnormal fluid collections. Normal appendix visualized. Vascular/Lymphatic: No pathologically enlarged lymph nodes identified. No evidence of abdominal aortic aneurysm. Reproductive:  No mass or other significant abnormality. Other: Stable postop changes  from previous left inguinal hernia repair. No evidence of recurrent hernia. Musculoskeletal:  No suspicious bone lesions identified. IMPRESSION: Tiny bilateral renal calculi. No evidence of ureteral calculi, hydronephrosis, or other acute findings. Electronically Signed   By: Danae Orleans M.D.   On: 11/30/2022 14:22   CT HEAD WO CONTRAST  Result Date: 11/30/2022 CLINICAL DATA:  Ataxia, head trauma. EXAM: CT HEAD WITHOUT CONTRAST TECHNIQUE: Contiguous axial images were obtained from the base of the skull through the vertex without intravenous contrast. RADIATION DOSE REDUCTION: This exam was performed according to the departmental dose-optimization program which includes automated exposure control, adjustment of the mA and/or kV according to patient size and/or use of iterative reconstruction technique. COMPARISON:  Head CT 02/13/2017. FINDINGS: Brain: No acute intracranial hemorrhage. Gray-white differentiation is preserved. No hydrocephalus or extra-axial collection. No mass effect or midline shift. Vascular: No hyperdense vessel or unexpected calcification. Skull: No calvarial fracture or suspicious bone lesion. Skull base is unremarkable. Sinuses/Orbits: Unremarkable. Other: None. IMPRESSION: No acute intracranial abnormality. Electronically Signed   By: Orvan Falconer M.D.   On: 11/30/2022 13:46      Subjective: Seen and examined on the day of discharge.  Stable no distress.  Doing well.  Stable for discharge home.  Discharge Exam: Vitals:   12/04/22 0615 12/04/22 0737  BP: (!) 135/95 (!) 134/96  Pulse:  88  Resp: 20 17  Temp: 97.8 F (36.6 C) 97.9 F (36.6 C)  SpO2: 95% 98%   Vitals:   12/03/22 0820 12/03/22 1542 12/04/22 0615 12/04/22 0737  BP: (!) 141/91 (!) 118/101 (!) 135/95 (!) 134/96  Pulse: (!) 102   88  Resp: 16 17 20 17   Temp: 98.7 F (37.1 C)  97.8 F (36.6 C) 97.9 F (36.6 C)  TempSrc:      SpO2: 97% 99% 95% 98%  Weight:      Height:        General: Pt is alert,  awake, not in acute distress Cardiovascular: RRR, S1/S2 +, no rubs, no gallops Respiratory: CTA bilaterally, no wheezing, no rhonchi Abdominal: Soft, NT, ND, bowel sounds + Extremities: no edema, no cyanosis    The results of significant diagnostics from this hospitalization (including imaging, microbiology, ancillary and laboratory) are listed below for reference.     Microbiology: Recent Results (from the past 240 hour(s))  Blood culture (single)     Status: Abnormal   Collection Time: 11/30/22 12:44  PM   Specimen: BLOOD  Result Value Ref Range Status   Specimen Description   Final    BLOOD Performed at [facility], [address] [place], [place] [address]    Special Requests   Final    BOTTLES DRAWN AEROBIC AND ANAEROBIC Blood Culture adequate volume Performed at [facility], [address] [place], [place] [address]    Culture  Setup Time   Final    GRAM POSITIVE COCCI AEROBIC BOTTLE ONLY CRITICAL RESULT CALLED TO, READ BACK BY AND VERIFIED WITH: ASHLEY ORSUOTO AT 0131 12/01/22 BY SKL Performed at [facility], [address], [place], [place] [address]    Culture GROUP B STREP(S.AGALACTIAE)ISOLATED (A)  Final   Report Status 12/03/2022 FINAL  Final   Organism ID, Bacteria GROUP B STREP(S.AGALACTIAE)ISOLATED  Final      Susceptibility   Group b strep(s.agalactiae)isolated - MIC*    CLINDAMYCIN <=0.25 SENSITIVE Sensitive     AMPICILLIN <=0.25 SENSITIVE Sensitive     ERYTHROMYCIN <=0.12 SENSITIVE Sensitive     VANCOMYCIN 0.5 SENSITIVE Sensitive     CEFTRIAXONE <=0.12 SENSITIVE Sensitive     LEVOFLOXACIN 0.5 SENSITIVE Sensitive     PENICILLIN <=0.06 SENSITIVE Sensitive     * GROUP B STREP(S.AGALACTIAE)ISOLATED  Blood Culture ID Panel (Reflexed)     Status: Abnormal   Collection Time: 11/30/22 12:44 PM  Result Value Ref Range Status   Enterococcus faecalis NOT DETECTED NOT DETECTED Final   Enterococcus Faecium NOT DETECTED NOT DETECTED  Final   Listeria monocytogenes NOT DETECTED NOT DETECTED Final   Staphylococcus species NOT DETECTED NOT DETECTED Final   Staphylococcus aureus (BCID) NOT DETECTED NOT DETECTED Final   Staphylococcus epidermidis NOT DETECTED NOT DETECTED Final   Staphylococcus lugdunensis NOT DETECTED NOT DETECTED Final   Streptococcus species DETECTED (A) NOT DETECTED Final    Comment: CRITICAL RESULT CALLED TO, READ BACK BY AND VERIFIED WITH: ASHLEY ORSUOTO @0131  ON 12/01/22 SKL    Streptococcus agalactiae DETECTED (A) NOT DETECTED Final    Comment: CRITICAL RESULT CALLED TO, READ BACK BY AND VERIFIED WITH: ASHLEY ORSUOTO @0131  ON 12/01/22 SKL    Streptococcus pneumoniae NOT DETECTED NOT DETECTED Final   Streptococcus pyogenes NOT DETECTED NOT DETECTED Final   A.calcoaceticus-baumannii NOT DETECTED NOT DETECTED Final   Bacteroides fragilis NOT DETECTED NOT DETECTED Final   Enterobacterales NOT DETECTED NOT DETECTED Final   Enterobacter cloacae complex NOT DETECTED NOT DETECTED Final   Escherichia coli NOT DETECTED NOT DETECTED Final   Klebsiella aerogenes NOT DETECTED NOT DETECTED Final   Klebsiella oxytoca NOT DETECTED NOT DETECTED Final   Klebsiella pneumoniae NOT DETECTED NOT DETECTED Final   Proteus species NOT DETECTED NOT DETECTED Final   Salmonella species NOT DETECTED NOT DETECTED Final   Serratia marcescens NOT DETECTED NOT DETECTED Final   Haemophilus influenzae NOT DETECTED NOT DETECTED Final   Neisseria meningitidis NOT DETECTED NOT DETECTED Final   Pseudomonas aeruginosa NOT DETECTED NOT DETECTED Final   Stenotrophomonas maltophilia NOT DETECTED NOT DETECTED Final   Candida albicans NOT DETECTED NOT DETECTED Final   Candida auris NOT DETECTED NOT DETECTED Final   Candida glabrata NOT DETECTED NOT DETECTED Final   Candida krusei NOT DETECTED NOT DETECTED Final   Candida parapsilosis NOT DETECTED NOT DETECTED Final   Candida tropicalis NOT DETECTED NOT DETECTED Final   Cryptococcus  neoformans/gattii NOT DETECTED NOT DETECTED Final    Comment: Performed at [facility], [address] [place], [place]  [address]  Resp panel by RT-PCR (RSV, Flu A&B, Covid) Anterior Nasal Swab     Status: None   Collection Time: 12/01/22  9:56 AM   Specimen: Anterior Nasal Swab  Result Value Ref Range Status   SARS Coronavirus 2 by RT PCR NEGATIVE NEGATIVE Final    Comment: (NOTE) SARS-CoV-2 target nucleic acids are NOT DETECTED.  The SARS-CoV-2 RNA is generally detectable in upper respiratory specimens during the acute phase of infection. The lowest concentration of SARS-CoV-2 viral copies this assay can detect is 138 copies/mL. A negative result does not preclude SARS-Cov-2 infection and should not be used as the sole basis for treatment or other patient management decisions. A negative result may occur with  improper specimen collection/handling, submission of specimen other than nasopharyngeal swab, presence of viral mutation(s) within the areas targeted by this assay, and inadequate number of viral copies(<138 copies/mL). A negative result must be combined with clinical observations, patient history, and epidemiological information. The expected result is Negative.  Fact Sheet for Patients:  BloggerCourse.com  Fact Sheet for Healthcare Providers:  SeriousBroker.it  This test is no t yet approved or cleared by the [place] FDA and  has been authorized for detection and/or diagnosis of SARS-CoV-2 by FDA under an Emergency Use Authorization (EUA). This EUA will remain  in effect (meaning this test can be used) for the duration of the COVID-19 declaration under Section 564(b)(1) of the Act, 21 U.S.C.section 360bbb-3(b)(1), unless the authorization is terminated  or revoked sooner.       Influenza A by PCR NEGATIVE NEGATIVE Final   Influenza B by PCR NEGATIVE NEGATIVE Final    Comment: (NOTE) The Xpert Xpress  SARS-CoV-2/FLU/RSV plus assay is intended as an aid in the diagnosis of influenza from Nasopharyngeal swab specimens and should not be used as a sole basis for treatment. Nasal washings and aspirates are unacceptable for Xpert Xpress SARS-CoV-2/FLU/RSV testing.  Fact Sheet for Patients: BloggerCourse.com  Fact Sheet for Healthcare Providers: SeriousBroker.it  This test is not yet approved or cleared by the [place] FDA and has been authorized for detection and/or diagnosis of SARS-CoV-2 by FDA under an Emergency Use Authorization (EUA). This EUA will remain in effect (meaning this test can be used) for the duration of the COVID-19 declaration under Section 564(b)(1) of the Act, 21 U.S.C. section 360bbb-3(b)(1), unless the authorization is terminated or revoked.     Resp Syncytial Virus by PCR NEGATIVE NEGATIVE Final    Comment: (NOTE) Fact Sheet for Patients: BloggerCourse.com  Fact Sheet for Healthcare Providers: SeriousBroker.it  This test is not yet approved or cleared by the [place] FDA and has been authorized for detection and/or diagnosis of SARS-CoV-2 by FDA under an Emergency Use Authorization (EUA). This EUA will remain in effect (meaning this test can be used) for the duration of the COVID-19 declaration under Section 564(b)(1) of the Act, 21 U.S.C. section 360bbb-3(b)(1), unless the authorization is terminated or revoked.  Performed at [facility], [address] [place], [place] [address]   Blood Culture (routine x 2)     Status: None (Preliminary result)   Collection Time: 12/01/22  9:57 AM   Specimen: BLOOD  Result Value Ref Range Status   Specimen Description BLOOD BLOOD LEFT HAND  Final   Special Requests   Final    BOTTLES DRAWN AEROBIC AND ANAEROBIC Blood Culture results may not be optimal due to an inadequate volume of blood received  in culture bottles   Culture  Final    NO GROWTH 3 DAYS Performed at [facility], [address] [place], [place] [address]    Report Status PENDING  Incomplete  Blood Culture (routine x 2)     Status: None (Preliminary result)   Collection Time: 12/01/22  9:58 AM   Specimen: BLOOD  Result Value Ref Range Status   Specimen Description BLOOD RIGHT ANTECUBITAL  Final   Special Requests   Final    BOTTLES DRAWN AEROBIC AND ANAEROBIC Blood Culture adequate volume   Culture   Final    NO GROWTH 3 DAYS Performed at [facility], [address] [place], [place] [address]    Report Status PENDING  Incomplete  Aerobic Culture w Gram Stain (superficial specimen)     Status: None   Collection Time: 12/01/22  1:46 PM   Specimen: Wound  Result Value Ref Range Status   Specimen Description   Final    WOUND Performed at [facility], [address], [place], [place] [address]    Special Requests   Final    TOE Performed at [facility], [address] [place], [place] [address]    Gram Stain   Final    ABUNDANT GRAM POSITIVE COCCI NO WBC SEEN Performed at [facility], [address], [place], [place] [address]    Culture ABUNDANT STAPHYLOCOCCUS AUREUS  Final   Report Status 12/04/2022 FINAL  Final   Organism ID, Bacteria STAPHYLOCOCCUS AUREUS  Final      Susceptibility   Staphylococcus aureus - MIC*    CIPROFLOXACIN <=0.5 SENSITIVE Sensitive     ERYTHROMYCIN <=0.25 SENSITIVE Sensitive     GENTAMICIN <=0.5 SENSITIVE Sensitive     OXACILLIN 0.5 SENSITIVE Sensitive     TETRACYCLINE <=1 SENSITIVE Sensitive     VANCOMYCIN 1 SENSITIVE Sensitive     TRIMETH/SULFA <=10 SENSITIVE Sensitive     CLINDAMYCIN <=0.25 SENSITIVE Sensitive     RIFAMPIN <=0.5 SENSITIVE Sensitive     Inducible Clindamycin NEGATIVE Sensitive     LINEZOLID 2 SENSITIVE Sensitive     * ABUNDANT STAPHYLOCOCCUS AUREUS     Labs: BNP (last 3 results) No results for  input(s): "BNP" in the last 8760 hours. Basic Metabolic Panel: Recent Labs  Lab 11/30/22 1500 12/01/22 0956 12/02/22 0450 12/03/22 0439 12/04/22 0648  NA 130* 130* 132* 133* 133*  K 3.0* 3.9 3.3* 3.5 3.9  CL 94* 95* 99 97* 97*  CO2 22 22 21* 18* 16*  GLUCOSE 92 231* 175* 189* 211*  BUN 18 24* 13 11 11   CREATININE 1.42* 2.08* 1.06 1.03 1.20  CALCIUM 8.6* 8.7* 8.1* 8.6* 8.8*  MG  --   --  1.2*  --   --   PHOS  --   --  3.0 4.1 4.6   Liver Function Tests: Recent Labs  Lab 12/01/22 0956 12/02/22 0450 12/03/22 0439 12/04/22 0648  AST 42*  --   --   --   ALT 21  --   --   --   ALKPHOS 126  --   --   --   BILITOT 0.9  --   --   --   PROT 7.5  --   --   --   ALBUMIN 3.5 2.8* 3.0* 3.4*   No results for input(s): "LIPASE", "AMYLASE" in the last 168 hours. No results for input(s): "AMMONIA" in the last 168 hours. CBC: Recent Labs  Lab 11/30/22 1244 12/01/22 0956 12/02/22 0450  12/03/22 0439 12/04/22 0648  WBC 17.2* 13.9* 8.5 7.7 7.9  NEUTROABS  --  8.9*  --   --   --   HGB 16.6 15.2 13.1 12.9* 13.3  HCT 46.6 44.8 39.0 38.0* 40.1  MCV 88.9 92.6 93.8 93.4 94.6  PLT 245 194 172 201 241   Cardiac Enzymes: No results for input(s): "CKTOTAL", "CKMB", "CKMBINDEX", "TROPONINI" in the last 168 hours. BNP: Invalid input(s): "POCBNP" CBG: Recent Labs  Lab 12/03/22 1244 12/03/22 1625 12/03/22 2231 12/04/22 0734 12/04/22 1131  GLUCAP 175* 259* 206* 204* 268*   D-Dimer No results for input(s): "DDIMER" in the last 72 hours. Hgb A1c No results for input(s): "HGBA1C" in the last 72 hours. Lipid Profile No results for input(s): "CHOL", "HDL", "LDLCALC", "TRIG", "CHOLHDL", "LDLDIRECT" in the last 72 hours. Thyroid function studies No results for input(s): "TSH", "T4TOTAL", "T3FREE", "THYROIDAB" in the last 72 hours.  Invalid input(s): "FREET3" Anemia work up No results for input(s): "VITAMINB12", "FOLATE", "FERRITIN", "TIBC", "IRON", "RETICCTPCT" in the last 72  hours. Urinalysis    Component Value Date/Time   COLORURINE YELLOW (A) 12/01/2022 0956   APPEARANCEUR CLEAR (A) 12/01/2022 0956   APPEARANCEUR Cloudy (A) 05/09/2016 1452   LABSPEC 1.025 12/01/2022 0956   LABSPEC 1.030 10/16/2014 1256   PHURINE 5.0 12/01/2022 0956   GLUCOSEU >=500 (A) 12/01/2022 0956   GLUCOSEU NEGATIVE 10/16/2014 1256   HGBUR NEGATIVE 12/01/2022 0956   BILIRUBINUR NEGATIVE 12/01/2022 0956   BILIRUBINUR Negative 05/09/2016 1452   BILIRUBINUR NEGATIVE 10/16/2014 1256   KETONESUR NEGATIVE 12/01/2022 0956   PROTEINUR NEGATIVE 12/01/2022 0956   UROBILINOGEN 0.2 03/05/2009 1613   NITRITE NEGATIVE 12/01/2022 0956   LEUKOCYTESUR NEGATIVE 12/01/2022 0956   LEUKOCYTESUR NEGATIVE 10/16/2014 1256   Sepsis Labs Recent Labs  Lab 12/01/22 0956 12/02/22 0450 12/03/22 0439 12/04/22 0648  WBC 13.9* 8.5 7.7 7.9   Microbiology Recent Results (from the past 240 hour(s))  Blood culture (single)     Status: Abnormal   Collection Time: 11/30/22 12:44 PM   Specimen: BLOOD  Result Value Ref Range Status   Specimen Description   Final    BLOOD Performed at [facility], [address] [place], [place] [address]    Special Requests   Final    BOTTLES DRAWN AEROBIC AND ANAEROBIC Blood Culture adequate volume Performed at [facility], [address] [place], [place] [address]    Culture  Setup Time   Final    GRAM POSITIVE COCCI AEROBIC BOTTLE ONLY CRITICAL RESULT CALLED TO, READ BACK BY AND VERIFIED WITH: ASHLEY ORSUOTO AT 0131 12/01/22 BY SKL Performed at [facility], [address], [place], [place] [address]    Culture GROUP B STREP(S.AGALACTIAE)ISOLATED (A)  Final   Report Status 12/03/2022 FINAL  Final   Organism ID, Bacteria GROUP B STREP(S.AGALACTIAE)ISOLATED  Final      Susceptibility   Group b strep(s.agalactiae)isolated - MIC*    CLINDAMYCIN <=0.25 SENSITIVE Sensitive     AMPICILLIN <=0.25 SENSITIVE Sensitive     ERYTHROMYCIN  <=0.12 SENSITIVE Sensitive     VANCOMYCIN 0.5 SENSITIVE Sensitive     CEFTRIAXONE <=0.12 SENSITIVE Sensitive     LEVOFLOXACIN 0.5 SENSITIVE Sensitive     PENICILLIN <=0.06 SENSITIVE Sensitive     * GROUP B STREP(S.AGALACTIAE)ISOLATED  Blood Culture ID Panel (Reflexed)     Status: Abnormal   Collection Time: 11/30/22 12:44 PM  Result Value Ref Range Status   Enterococcus faecalis NOT DETECTED NOT DETECTED Final  Enterococcus Faecium NOT DETECTED NOT DETECTED Final   Listeria monocytogenes NOT DETECTED NOT DETECTED Final   Staphylococcus species NOT DETECTED NOT DETECTED Final   Staphylococcus aureus (BCID) NOT DETECTED NOT DETECTED Final   Staphylococcus epidermidis NOT DETECTED NOT DETECTED Final   Staphylococcus lugdunensis NOT DETECTED NOT DETECTED Final   Streptococcus species DETECTED (A) NOT DETECTED Final    Comment: CRITICAL RESULT CALLED TO, READ BACK BY AND VERIFIED WITH: ASHLEY ORSUOTO @0131  ON 12/01/22 SKL    Streptococcus agalactiae DETECTED (A) NOT DETECTED Final    Comment: CRITICAL RESULT CALLED TO, READ BACK BY AND VERIFIED WITH: ASHLEY ORSUOTO @0131  ON 12/01/22 SKL    Streptococcus pneumoniae NOT DETECTED NOT DETECTED Final   Streptococcus pyogenes NOT DETECTED NOT DETECTED Final   A.calcoaceticus-baumannii NOT DETECTED NOT DETECTED Final   Bacteroides fragilis NOT DETECTED NOT DETECTED Final   Enterobacterales NOT DETECTED NOT DETECTED Final   Enterobacter cloacae complex NOT DETECTED NOT DETECTED Final   Escherichia coli NOT DETECTED NOT DETECTED Final   Klebsiella aerogenes NOT DETECTED NOT DETECTED Final   Klebsiella oxytoca NOT DETECTED NOT DETECTED Final   Klebsiella pneumoniae NOT DETECTED NOT DETECTED Final   Proteus species NOT DETECTED NOT DETECTED Final   Salmonella species NOT DETECTED NOT DETECTED Final   Serratia marcescens NOT DETECTED NOT DETECTED Final   Haemophilus influenzae NOT DETECTED NOT DETECTED Final   Neisseria meningitidis NOT DETECTED  NOT DETECTED Final   Pseudomonas aeruginosa NOT DETECTED NOT DETECTED Final   Stenotrophomonas maltophilia NOT DETECTED NOT DETECTED Final   Candida albicans NOT DETECTED NOT DETECTED Final   Candida auris NOT DETECTED NOT DETECTED Final   Candida glabrata NOT DETECTED NOT DETECTED Final   Candida krusei NOT DETECTED NOT DETECTED Final   Candida parapsilosis NOT DETECTED NOT DETECTED Final   Candida tropicalis NOT DETECTED NOT DETECTED Final   Cryptococcus neoformans/gattii NOT DETECTED NOT DETECTED Final    Comment: Performed at [facility], [address] [place], [place] [address]  Resp panel by RT-PCR (RSV, Flu A&B, Covid) Anterior Nasal Swab     Status: None   Collection Time: 12/01/22  9:56 AM   Specimen: Anterior Nasal Swab  Result Value Ref Range Status   SARS Coronavirus 2 by RT PCR NEGATIVE NEGATIVE Final    Comment: (NOTE) SARS-CoV-2 target nucleic acids are NOT DETECTED.  The SARS-CoV-2 RNA is generally detectable in upper respiratory specimens during the acute phase of infection. The lowest concentration of SARS-CoV-2 viral copies this assay can detect is 138 copies/mL. A negative result does not preclude SARS-Cov-2 infection and should not be used as the sole basis for treatment or other patient management decisions. A negative result may occur with  improper specimen collection/handling, submission of specimen other than nasopharyngeal swab, presence of viral mutation(s) within the areas targeted by this assay, and inadequate number of viral copies(<138 copies/mL). A negative result must be combined with clinical observations, patient history, and epidemiological information. The expected result is Negative.  Fact Sheet for Patients:  BloggerCourse.com  Fact Sheet for Healthcare Providers:  SeriousBroker.it  This test is no t yet approved or cleared by the [place] FDA and  has been authorized for  detection and/or diagnosis of SARS-CoV-2 by FDA under an Emergency Use Authorization (EUA). This EUA will remain  in effect (meaning this test can be used) for the duration of the COVID-19 declaration under Section 564(b)(1) of the Act, 21 U.S.C.section 360bbb-3(b)(1), unless the authorization is terminated  or revoked sooner.       Influenza A by PCR NEGATIVE NEGATIVE Final   Influenza B by PCR NEGATIVE NEGATIVE Final    Comment: (NOTE) The Xpert Xpress SARS-CoV-2/FLU/RSV plus assay is intended as an aid in the diagnosis of influenza from Nasopharyngeal swab specimens and should not be used as a sole basis for treatment. Nasal washings and aspirates are unacceptable for Xpert Xpress SARS-CoV-2/FLU/RSV testing.  Fact Sheet for Patients: BloggerCourse.com  Fact Sheet for Healthcare Providers: SeriousBroker.it  This test is not yet approved or cleared by the [place] FDA and has been authorized for detection and/or diagnosis of SARS-CoV-2 by FDA under an Emergency Use Authorization (EUA). This EUA will remain in effect (meaning this test can be used) for the duration of the COVID-19 declaration under Section 564(b)(1) of the Act, 21 U.S.C. section 360bbb-3(b)(1), unless the authorization is terminated or revoked.     Resp Syncytial Virus by PCR NEGATIVE NEGATIVE Final    Comment: (NOTE) Fact Sheet for Patients: BloggerCourse.com  Fact Sheet for Healthcare Providers: SeriousBroker.it  This test is not yet approved or cleared by the [place] FDA and has been authorized for detection and/or diagnosis of SARS-CoV-2 by FDA under an Emergency Use Authorization (EUA). This EUA will remain in effect (meaning this test can be used) for the duration of the COVID-19 declaration under Section 564(b)(1) of the Act, 21 U.S.C. section 360bbb-3(b)(1), unless the authorization is  terminated or revoked.  Performed at [facility], [address] [place], [place] [address]   Blood Culture (routine x 2)     Status: None (Preliminary result)   Collection Time: 12/01/22  9:57 AM   Specimen: BLOOD  Result Value Ref Range Status   Specimen Description BLOOD BLOOD LEFT HAND  Final   Special Requests   Final    BOTTLES DRAWN AEROBIC AND ANAEROBIC Blood Culture results may not be optimal due to an inadequate volume of blood received in culture bottles   Culture   Final    NO GROWTH 3 DAYS Performed at [facility], [address], [place], [place] [address]    Report Status PENDING  Incomplete  Blood Culture (routine x 2)     Status: None (Preliminary result)   Collection Time: 12/01/22  9:58 AM   Specimen: BLOOD  Result Value Ref Range Status   Specimen Description BLOOD RIGHT ANTECUBITAL  Final   Special Requests   Final    BOTTLES DRAWN AEROBIC AND ANAEROBIC Blood Culture adequate volume   Culture   Final    NO GROWTH 3 DAYS Performed at [facility], [address], [place], [place] [address]    Report Status PENDING  Incomplete  Aerobic Culture w Gram Stain (superficial specimen)     Status: None   Collection Time: 12/01/22  1:46 PM   Specimen: Wound  Result Value Ref Range Status   Specimen Description   Final    WOUND Performed at [facility], [address] [place], [place] [address]    Special Requests   Final    TOE Performed at [facility], [address] [place], [place] [address]    Gram Stain   Final    ABUNDANT GRAM POSITIVE COCCI NO WBC SEEN Performed at [facility], [address], [place], [place] [address]    Culture ABUNDANT STAPHYLOCOCCUS AUREUS  Final   Report Status 12/04/2022 FINAL  Final   Organism ID, Bacteria STAPHYLOCOCCUS AUREUS  Final      Susceptibility   Staphylococcus aureus - MIC*    CIPROFLOXACIN <=0.5 SENSITIVE Sensitive     ERYTHROMYCIN <=0.25  SENSITIVE Sensitive     GENTAMICIN <=0.5 SENSITIVE Sensitive     OXACILLIN 0.5 SENSITIVE Sensitive     TETRACYCLINE <=1 SENSITIVE Sensitive     VANCOMYCIN 1 SENSITIVE Sensitive     TRIMETH/SULFA <=10 SENSITIVE Sensitive     CLINDAMYCIN <=0.25 SENSITIVE Sensitive     RIFAMPIN <=0.5 SENSITIVE Sensitive     Inducible Clindamycin NEGATIVE Sensitive     LINEZOLID 2 SENSITIVE Sensitive     * ABUNDANT STAPHYLOCOCCUS AUREUS     Time coordinating discharge: Over 30 minutes  SIGNED:   Tresa Moore, MD  [facility] 12/04/2022, 12:44 PM Pager   If 7PM-7AM, please contact night-coverage

## 2022-12-04 NOTE — Progress Notes (Addendum)
Date of Admission:  12/01/2022      ID: Dustin Williamson is a 52 y.o. male  Principal Problem:   Severe sepsis (HCC) Active Problems:   Bacteremia   GBS (group B streptococcus) infection   Sepsis due to group B Streptococcus (HCC)   Chronic diastolic CHF (congestive heart failure) (HCC)    Subjective: Doing so much better Says no back pain No fever or chills Walked in the room No dizziness  Medications:   aspirin EC  81 mg Oral Daily   busPIRone  15 mg Oral BID   DULoxetine  90 mg Oral Daily   enoxaparin (LOVENOX) injection  0.5 mg/kg Subcutaneous Q24H   furosemide  20 mg Oral Daily   gabapentin  400 mg Oral TID   insulin aspart  0-15 Units Subcutaneous TID WC   insulin aspart  0-5 Units Subcutaneous QHS   insulin glargine-yfgn  20 Units Subcutaneous Daily   lisinopril  2.5 mg Oral Daily   pantoprazole  40 mg Oral Daily   QUEtiapine  25 mg Oral QHS   spironolactone  12.5 mg Oral Daily   tamsulosin  0.4 mg Oral QPC supper    Objective: Vital signs in last 24 hours: Patient Vitals for the past 24 hrs:  BP Temp Pulse Resp SpO2  12/04/22 0737 (!) 134/96 97.9 F (36.6 C) 88 17 98 %  12/04/22 0615 (!) 135/95 97.8 F (36.6 C) -- 20 95 %  12/03/22 1542 (!) 118/101 -- -- 17 99 %       PHYSICAL EXAM:  General: Alert, cooperative, no distress, appears stated age.  Head: Normocephalic, without obvious abnormality, atraumatic. Eyes: Conjunctivae clear, anicteric sclerae. Pupils are equal ENT Nares normal. No drainage or sinus tenderness. Lips, mucosa, and tongue normal. No Thrush Neck: Supple, symmetrical, no adenopathy, thyroid: non tender no carotid bruit and no JVD. Back: No CVA tenderness. Lungs: Clear to auscultation bilaterally. No Wheezing or Rhonchi. No rales. Heart: Regular rate and rhythm, no murmur, rub or gallop. Abdomen: Soft, non-tender,not distended. Bowel sounds normal. No masses Extremities: left great toe wound much better- dried up Back- spine-  no tenderness Skin: No rashes or lesions. Or bruising Lymph: Cervical, supraclavicular normal. Neurologic: Grossly non-focal  Lab Results    Latest Ref Rng & Units 12/04/2022    6:48 AM 12/03/2022    4:39 AM 12/02/2022    4:50 AM  CBC  WBC 4.0 - 10.5 K/uL 7.9  7.7  8.5   Hemoglobin 13.0 - 17.0 g/dL 16.1  09.6  04.5   Hematocrit 39.0 - 52.0 % 40.1  38.0  39.0   Platelets 150 - 400 K/uL 241  201  172        Latest Ref Rng & Units 12/04/2022    6:48 AM 12/03/2022    4:39 AM 12/02/2022    4:50 AM  CMP  Glucose 70 - 99 mg/dL 409  811  914   BUN 6 - 20 mg/dL 11  11  13    Creatinine 0.61 - 1.24 mg/dL 7.82  9.56  2.13   Sodium 135 - 145 mmol/L 133  133  132   Potassium 3.5 - 5.1 mmol/L 3.9  3.5  3.3   Chloride 98 - 111 mmol/L 97  97  99   CO2 22 - 32 mmol/L 16  18  21    Calcium 8.9 - 10.3 mg/dL 8.8  8.6  8.1       Microbiology: 11/30/22-- [facility]  12/02/22  BC - NG    Assessment/Plan: ? 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

## 2022-12-05 ENCOUNTER — Other Ambulatory Visit: Payer: Self-pay

## 2022-12-05 LAB — CULTURE, BLOOD (ROUTINE X 2): Culture: NO GROWTH

## 2022-12-05 LAB — HEMOGLOBIN A1C
Hgb A1c MFr Bld: >15.5 % — ABNORMAL HIGH (ref 4.8–5.6)
Mean Plasma Glucose: >398 mg/dL

## 2022-12-06 LAB — CULTURE, BLOOD (ROUTINE X 2)
Culture: NO GROWTH
Special Requests: ADEQUATE

## 2023-05-16 ENCOUNTER — Emergency Department
Admission: EM | Admit: 2023-05-16 | Discharge: 2023-05-16 | Disposition: A | Discharge status: Home / Self Care | Payer: Medicare Other | Attending: Emergency Medicine | Admitting: Emergency Medicine

## 2023-05-16 ENCOUNTER — Other Ambulatory Visit: Payer: Self-pay

## 2023-05-16 ENCOUNTER — Emergency Department: Payer: Medicare Other

## 2023-05-16 DIAGNOSIS — I110 Hypertensive heart disease with heart failure: Secondary | ICD-10-CM | POA: Insufficient documentation

## 2023-05-16 DIAGNOSIS — J45909 Unspecified asthma, uncomplicated: Secondary | ICD-10-CM | POA: Insufficient documentation

## 2023-05-16 DIAGNOSIS — I509 Heart failure, unspecified: Secondary | ICD-10-CM | POA: Diagnosis not present

## 2023-05-16 DIAGNOSIS — R310 Gross hematuria: Secondary | ICD-10-CM | POA: Diagnosis not present

## 2023-05-16 DIAGNOSIS — R319 Hematuria, unspecified: Secondary | ICD-10-CM | POA: Diagnosis present

## 2023-05-16 DIAGNOSIS — E119 Type 2 diabetes mellitus without complications: Secondary | ICD-10-CM | POA: Diagnosis not present

## 2023-05-16 DIAGNOSIS — R102 Pelvic and perineal pain: Secondary | ICD-10-CM | POA: Diagnosis not present

## 2023-05-16 LAB — URINALYSIS, ROUTINE W REFLEX MICROSCOPIC
Bacteria, UA: NONE SEEN
Bilirubin Urine: NEGATIVE
Glucose, UA: >=500 mg/dL — AB
Ketones, ur: NEGATIVE mg/dL
Nitrite: NEGATIVE
Protein, ur: 100 mg/dL — AB
RBC / HPF: >50 RBC/hpf (ref 0–5)
Specific Gravity, Urine: 1.02 (ref 1.005–1.030)
WBC, UA: >50 WBC/hpf (ref 0–5)
pH: 5 (ref 5.0–8.0)

## 2023-05-16 LAB — BASIC METABOLIC PANEL
Anion gap: 6 (ref 5–15)
BUN: 20 mg/dL (ref 6–20)
CO2: 27 mmol/L (ref 22–32)
Calcium: 8.9 mg/dL (ref 8.9–10.3)
Chloride: 103 mmol/L (ref 98–111)
Creatinine, Ser: 1.01 mg/dL (ref 0.61–1.24)
GFR, Estimated: >60 mL/min (ref >60)
Glucose, Bld: 108 mg/dL — ABNORMAL HIGH (ref 70–99)
Potassium: 3.8 mmol/L (ref 3.5–5.1)
Sodium: 136 mmol/L (ref 135–145)

## 2023-05-16 LAB — CBC
HCT: 49.1 % (ref 39.0–52.0)
Hemoglobin: 16.7 g/dL (ref 13.0–17.0)
MCH: 30.3 pg (ref 26.0–34.0)
MCHC: 34 g/dL (ref 30.0–36.0)
MCV: 89.1 fL (ref 80.0–100.0)
Platelets: 273 10*3/uL (ref 150–400)
RBC: 5.51 MIL/uL (ref 4.22–5.81)
RDW: 12 % (ref 11.5–15.5)
WBC: 12.6 10*3/uL — ABNORMAL HIGH (ref 4.0–10.5)
nRBC: 0 % (ref 0.0–0.2)

## 2023-05-16 MED ORDER — TAMSULOSIN HCL 0.4 MG PO CAPS: 0.4000 mg | ORAL_CAPSULE | Freq: Every day | ORAL | 1 refills | Status: AC

## 2023-05-16 MED ORDER — AMOXICILLIN-POT CLAVULANATE 875-125 MG PO TABS: 1.0000 | ORAL_TABLET | Freq: Two times a day (BID) | ORAL | 0 refills | Status: DC

## 2023-05-16 NOTE — ED Triage Notes (Signed)
Pt to ED for hematuria started a few minutes ago and centralized pain to scrotum.

## 2023-05-16 NOTE — ED Notes (Signed)
See triage note  Presents with blood in urine  States this started about 30 mins PTA  Denies any pain but thinks it may be a renal stone

## 2023-05-16 NOTE — ED Provider Notes (Signed)
[facility] Provider Note    Event Date/Time   First MD Initiated Contact with Patient 05/16/23 1252     (approximate)   History   Hematuria   HPI  JAMEEL ORTMANN is a 52 y.o. male history of diabetes, hypertension, kidney stones, CHF, asthma, heart failure and history of sepsis presents emergency department with hematuria.  Patient states he is currently on so many pain medications that if he was hurting he would not know.  Did have some pelvic discomfort..  No fever that he knows of.  No chills.  No vomiting.      Physical Exam   Triage Vital Signs: ED Triage Vitals  Encounter Vitals Group     BP 05/16/23 1202 102/81     Systolic BP Percentile --      Diastolic BP Percentile --      Pulse Rate 05/16/23 1202 76     Resp 05/16/23 1202 18     Temp 05/16/23 1202 97.9 F (36.6 C)     Temp src --      SpO2 05/16/23 1202 98 %     Weight 05/16/23 1204 205 lb (93 kg)     Height 05/16/23 1204 5\' 6"  (1.676 m)     Head Circumference --      Peak Flow --      Pain Score 05/16/23 1204 5     Pain Loc --      Pain Education --      Exclude from Growth Chart --     Most recent vital signs: Vitals:   05/16/23 1202  BP: 102/81  Pulse: 76  Resp: 18  Temp: 97.9 F (36.6 C)  SpO2: 98%     General: Awake, no distress.   CV:  Good peripheral perfusion. regular rate and  rhythm Resp:  Normal effort. Lungs cta Abd:  No distention.  Tender at the pubis, no CVA tenderness Other:      ED Results / Procedures / Treatments   Labs (all labs ordered are listed, but only abnormal results are displayed) Labs Reviewed  CBC - Abnormal; Notable for the following components:      Result Value   WBC 12.6 (*)    All other components within normal limits  BASIC METABOLIC PANEL - Abnormal; Notable for the following components:   Glucose, Bld 108 (*)    All other components within normal limits  URINALYSIS, ROUTINE W REFLEX MICROSCOPIC - Abnormal; Notable  for the following components:   Color, Urine AMBER (*)    APPearance CLOUDY (*)    Glucose, UA >=500 (*)    Hgb urine dipstick MODERATE (*)    Protein, ur 100 (*)    Leukocytes,Ua MODERATE (*)    All other components within normal limits     EKG     RADIOLOGY CT renal stone    PROCEDURES:   Procedures   MEDICATIONS ORDERED IN ED: Medications - No data to display   IMPRESSION / MDM / ASSESSMENT AND PLAN / ED COURSE  I reviewed the triage vital signs and the nursing notes.                              Differential diagnosis includes, but is not limited to, kidney stone, pyelonephritis, prostatitis, epididymitis, bladder cancer  Patient's presentation is most consistent with acute illness / injury with system symptoms.   Due to the patient's  history of diabetes combined kidney stones and elevated WBC will get CT renal stone study  Patient asking for prescription for Flomax   CT renal stone study does not show a active kidney stone.  Does have some better in the poles of the kidney.  This was independently reviewed interpreted by me by reading radiologist report.  I do feel the patient's exam and complaints are probably more consistent with a recently passed kidney stone.  States he is to have 3 kidney stones in the kidney where he only has 2 on CT at this time.  Will go ahead and treat him with an antibiotic, place him back on tamsulosin.  Patient does not require pain medication as he states he is having no pain.  He is to follow-up with his regular doctor if not improving to 3 days.  Return emergency department worsening.  Discharged stable condition.   FINAL CLINICAL IMPRESSION(S) / ED DIAGNOSES   Final diagnoses:  Gross hematuria     Rx / DC Orders   ED Discharge Orders          Ordered    amoxicillin-clavulanate (AUGMENTIN) 875-125 MG tablet  2 times daily        05/16/23 1455    tamsulosin (FLOMAX) 0.4 MG CAPS capsule  Daily        05/16/23 1455              Note:  This document was prepared using Dragon voice recognition software and may include unintentional dictation errors.    Faythe Ghee, PA-C 05/16/23 1458    Jene Every, MD 05/16/23 1500

## 2024-04-15 ENCOUNTER — Emergency Department
Admission: EM | Admit: 2024-04-15 | Discharge: 2024-04-15 | Disposition: A | Discharge status: Home / Self Care | Attending: Emergency Medicine | Admitting: Emergency Medicine

## 2024-04-15 ENCOUNTER — Other Ambulatory Visit: Payer: Self-pay

## 2024-04-15 DIAGNOSIS — I110 Hypertensive heart disease with heart failure: Secondary | ICD-10-CM | POA: Insufficient documentation

## 2024-04-15 DIAGNOSIS — K029 Dental caries, unspecified: Secondary | ICD-10-CM | POA: Insufficient documentation

## 2024-04-15 DIAGNOSIS — I5032 Chronic diastolic (congestive) heart failure: Secondary | ICD-10-CM | POA: Insufficient documentation

## 2024-04-15 DIAGNOSIS — K0889 Other specified disorders of teeth and supporting structures: Secondary | ICD-10-CM | POA: Diagnosis present

## 2024-04-15 DIAGNOSIS — E119 Type 2 diabetes mellitus without complications: Secondary | ICD-10-CM | POA: Diagnosis not present

## 2024-04-15 MED ORDER — AMOXICILLIN-POT CLAVULANATE 875-125 MG PO TABS: 1.0000 | ORAL_TABLET | Freq: Two times a day (BID) | ORAL | 0 refills | Status: AC

## 2024-04-15 MED ORDER — CHLORHEXIDINE GLUCONATE 0.12 % MT SOLN: 15.0000 mL | Freq: Two times a day (BID) | OROMUCOSAL | 0 refills | Status: AC

## 2024-04-15 NOTE — Discharge Instructions (Signed)
 Take the antibiotics as prescribed.  Please follow-up with your outpatient provider.  Please return for any new, worsening, or changing symptoms or other concerns.  It was a pleasure caring for you today.

## 2024-04-15 NOTE — ED Provider Notes (Signed)
 [facility] Provider Note    Event Date/Time   First MD Initiated Contact with Patient 04/15/24 1247     (approximate)   History   Dental Pain   HPI  Dustin Williamson is a 53 y.o. male who presents today for evaluation of dental pain that has been ongoing for several weeks.  He reports that he has had to have teeth pulled in the past.  He denies fevers or chills.  No difficulty breathing or swallowing.  No facial or neck swelling or erythema.  No voice change.  No pain with opening his mouth.  Patient Active Problem List   Diagnosis Date Noted   Toe infection 12/04/2022   GBS (group B streptococcus) infection 12/02/2022   Sepsis due to group B Streptococcus (HCC) 12/02/2022   Chronic diastolic CHF (congestive heart failure) (HCC) 12/02/2022   Severe sepsis (HCC) 12/01/2022   Bacteremia 12/01/2022   Rhabdomyolysis 06/11/2018   Influenza B 06/10/2018   Hyponatremia 11/15/2016   OSA (obstructive sleep apnea) 08/12/2016   HTN (hypertension) 08/12/2016   Depression 08/12/2016   Left ureteral stone    Ureteral obstruction, left 05/22/2016   Acute left flank pain    AKI (acute kidney injury) 05/04/2016   Diabetes (HCC) 05/04/2016   Hypotension 05/04/2016   Urinary obstruction 05/04/2016   Hyperglycemia 09/26/2015   CLOSED FRACTURE OF BASE OF OTHER METACARPAL BONE 03/09/2009          Physical Exam   Triage Vital Signs: ED Triage Vitals  Encounter Vitals Group     BP 04/15/24 1225 (!) 140/96     Girls Systolic BP Percentile --      Girls Diastolic BP Percentile --      Boys Systolic BP Percentile --      Boys Diastolic BP Percentile --      Pulse Rate 04/15/24 1225 77     Resp 04/15/24 1225 18     Temp 04/15/24 1225 97.6 F (36.4 C)     Temp Source 04/15/24 1225 Oral     SpO2 04/15/24 1225 98 %     Weight 04/15/24 1226 205 lb 0.4 oz (93 kg)     Height 04/15/24 1226 5' 6 (1.676 m)     Head Circumference --      Peak Flow --      Pain  Score 04/15/24 1225 10     Pain Loc --      Pain Education --      Exclude from Growth Chart --     Most recent vital signs: Vitals:   04/15/24 1225  BP: (!) 140/96  Pulse: 77  Resp: 18  Temp: 97.6 F (36.4 C)  SpO2: 98%    Physical Exam Vitals and nursing note reviewed.  Constitutional:      General: Awake and alert. No acute distress.    Appearance: Normal appearance. The patient is normal weight.  HENT:     Head: Normocephalic and atraumatic.     Mouth: Mucous membranes are moist.  Poor dentition diffusely with multiple missing teeth.  There is gingival irritation with tartar buildup along his lower teeth, with tap tenderness to tooth #26.  No gingival fluctuance or erythema.  No sublingual swelling noted.  No trismus.  No drooling.  No voice change.  No facial or neck swelling or erythema. Eyes:     General: PERRL. Normal EOMs        Right eye: No discharge.  Left eye: No discharge.     Conjunctiva/sclera: Conjunctivae normal.  Cardiovascular:     Rate and Rhythm: Normal rate and regular rhythm.     Pulses: Normal pulses.  Pulmonary:     Effort: Pulmonary effort is normal. No respiratory distress.     Breath sounds: Normal breath sounds.  Abdominal:     Abdomen is soft. There is no abdominal tenderness. No rebound or guarding. No distention. Musculoskeletal:        General: No swelling. Normal range of motion.     Cervical back: Normal range of motion and neck supple.  Skin:    General: Skin is warm and dry.     Capillary Refill: Capillary refill takes less than 2 seconds.     Findings: No rash.  Neurological:     Mental Status: The patient is awake and alert.      ED Results / Procedures / Treatments   Labs (all labs ordered are listed, but only abnormal results are displayed) Labs Reviewed - No data to display   EKG     RADIOLOGY     PROCEDURES:  Critical Care performed:   Procedures   MEDICATIONS ORDERED IN ED: Medications - No  data to display   IMPRESSION / MDM / ASSESSMENT AND PLAN / ED COURSE  I reviewed the triage vital signs and the nursing notes.   Differential diagnosis includes, but is not limited to, pulpitis, caries, gingivitis, abscess.  Patient was evaluated in the emergency department for dental pain.  Patient has diffusely poor dentition, and irritation along the gingiva.  No gingival swelling or fluctuance concerning for gingival abscess.  No trismus, nuchal rigidity, neck pain, hot potato voice, uvular deviation or malocclusion to suggest deep space infection. No sublingual swelling concerning for Ludwig's angina.  Patient was started on antibiotics and chlorhexidine  mouth rinse.  Patient was treated symptomatically in the emergency department. Discussed care plan, return precautions, and advised close outpatient follow-up with dentist.  Patient has a dentist who has pulled his teeth before.  Agrees to make an appointment.  Patient agrees with plan of care.  Discharged in stable condition.   Patient's presentation is most consistent with exacerbation of chronic illness.    FINAL CLINICAL IMPRESSION(S) / ED DIAGNOSES   Final diagnoses:  Pain, dental  Infected dental caries     Rx / DC Orders   ED Discharge Orders          Ordered    amoxicillin -clavulanate (AUGMENTIN ) 875-125 MG tablet  2 times daily        04/15/24 1300    chlorhexidine  (PERIDEX ) 0.12 % solution  2 times daily        04/15/24 1300             Note:  This document was prepared using Dragon voice recognition software and may include unintentional dictation errors.   Cybil Senegal E, PA-C 04/15/24 1333    Jacolyn Pae, MD 04/15/24 [phone]

## 2024-04-15 NOTE — ED Triage Notes (Signed)
 Pt presents to the ED via POV from home for right lower dental pain. Pt states that he has had to have teeth pulled in the past. Pt denies fevers. This started a couple of months ago.

## 2024-07-14 ENCOUNTER — Other Ambulatory Visit: Payer: Self-pay

## 2024-07-14 ENCOUNTER — Emergency Department

## 2024-07-14 ENCOUNTER — Inpatient Hospital Stay
Admission: EM | Admit: 2024-07-14 | Discharge: 2024-07-17 | DRG: 377 | Disposition: A | Discharge status: Home / Self Care | Attending: Internal Medicine | Admitting: Internal Medicine

## 2024-07-14 DIAGNOSIS — E0810 Diabetes mellitus due to underlying condition with ketoacidosis without coma: Secondary | ICD-10-CM | POA: Diagnosis not present

## 2024-07-14 DIAGNOSIS — F419 Anxiety disorder, unspecified: Secondary | ICD-10-CM | POA: Diagnosis present

## 2024-07-14 DIAGNOSIS — Z79899 Other long term (current) drug therapy: Secondary | ICD-10-CM

## 2024-07-14 DIAGNOSIS — Z794 Long term (current) use of insulin: Secondary | ICD-10-CM | POA: Diagnosis not present

## 2024-07-14 DIAGNOSIS — N200 Calculus of kidney: Secondary | ICD-10-CM | POA: Diagnosis present

## 2024-07-14 DIAGNOSIS — I2489 Other forms of acute ischemic heart disease: Secondary | ICD-10-CM | POA: Diagnosis present

## 2024-07-14 DIAGNOSIS — Z7982 Long term (current) use of aspirin: Secondary | ICD-10-CM | POA: Diagnosis not present

## 2024-07-14 DIAGNOSIS — Z6832 Body mass index (BMI) 32.0-32.9, adult: Secondary | ICD-10-CM | POA: Diagnosis not present

## 2024-07-14 DIAGNOSIS — K2289 Other specified disease of esophagus: Secondary | ICD-10-CM | POA: Diagnosis not present

## 2024-07-14 DIAGNOSIS — J45909 Unspecified asthma, uncomplicated: Secondary | ICD-10-CM | POA: Diagnosis present

## 2024-07-14 DIAGNOSIS — R1314 Dysphagia, pharyngoesophageal phase: Secondary | ICD-10-CM | POA: Diagnosis present

## 2024-07-14 DIAGNOSIS — D72823 Leukemoid reaction: Secondary | ICD-10-CM | POA: Diagnosis present

## 2024-07-14 DIAGNOSIS — K5900 Constipation, unspecified: Secondary | ICD-10-CM | POA: Diagnosis present

## 2024-07-14 DIAGNOSIS — E1110 Type 2 diabetes mellitus with ketoacidosis without coma: Secondary | ICD-10-CM | POA: Diagnosis present

## 2024-07-14 DIAGNOSIS — Z8249 Family history of ischemic heart disease and other diseases of the circulatory system: Secondary | ICD-10-CM

## 2024-07-14 DIAGNOSIS — N179 Acute kidney failure, unspecified: Secondary | ICD-10-CM | POA: Diagnosis present

## 2024-07-14 DIAGNOSIS — I110 Hypertensive heart disease with heart failure: Secondary | ICD-10-CM | POA: Diagnosis present

## 2024-07-14 DIAGNOSIS — Z7984 Long term (current) use of oral hypoglycemic drugs: Secondary | ICD-10-CM | POA: Diagnosis not present

## 2024-07-14 DIAGNOSIS — K2100 Gastro-esophageal reflux disease with esophagitis, without bleeding: Secondary | ICD-10-CM | POA: Diagnosis not present

## 2024-07-14 DIAGNOSIS — I5032 Chronic diastolic (congestive) heart failure: Secondary | ICD-10-CM | POA: Diagnosis present

## 2024-07-14 DIAGNOSIS — R1319 Other dysphagia: Secondary | ICD-10-CM | POA: Diagnosis not present

## 2024-07-14 DIAGNOSIS — K2101 Gastro-esophageal reflux disease with esophagitis, with bleeding: Secondary | ICD-10-CM | POA: Diagnosis present

## 2024-07-14 DIAGNOSIS — K2981 Duodenitis with bleeding: Principal | ICD-10-CM | POA: Diagnosis present

## 2024-07-14 DIAGNOSIS — E861 Hypovolemia: Secondary | ICD-10-CM | POA: Diagnosis present

## 2024-07-14 DIAGNOSIS — E66811 Obesity, class 1: Secondary | ICD-10-CM | POA: Diagnosis present

## 2024-07-14 DIAGNOSIS — E669 Obesity, unspecified: Secondary | ICD-10-CM | POA: Diagnosis present

## 2024-07-14 DIAGNOSIS — Z91148 Patient's other noncompliance with medication regimen for other reason: Secondary | ICD-10-CM

## 2024-07-14 DIAGNOSIS — Z820 Family history of epilepsy and other diseases of the nervous system: Secondary | ICD-10-CM

## 2024-07-14 DIAGNOSIS — R933 Abnormal findings on diagnostic imaging of other parts of digestive tract: Secondary | ICD-10-CM | POA: Diagnosis not present

## 2024-07-14 DIAGNOSIS — F32A Depression, unspecified: Secondary | ICD-10-CM | POA: Diagnosis present

## 2024-07-14 DIAGNOSIS — Z833 Family history of diabetes mellitus: Secondary | ICD-10-CM

## 2024-07-14 DIAGNOSIS — E876 Hypokalemia: Secondary | ICD-10-CM | POA: Diagnosis present

## 2024-07-14 DIAGNOSIS — R946 Abnormal results of thyroid function studies: Secondary | ICD-10-CM | POA: Diagnosis present

## 2024-07-14 DIAGNOSIS — Z807 Family history of other malignant neoplasms of lymphoid, hematopoietic and related tissues: Secondary | ICD-10-CM

## 2024-07-14 DIAGNOSIS — E7800 Pure hypercholesterolemia, unspecified: Secondary | ICD-10-CM | POA: Diagnosis present

## 2024-07-14 DIAGNOSIS — E1169 Type 2 diabetes mellitus with other specified complication: Secondary | ICD-10-CM

## 2024-07-14 DIAGNOSIS — R112 Nausea with vomiting, unspecified: Principal | ICD-10-CM

## 2024-07-14 DIAGNOSIS — Z87891 Personal history of nicotine dependence: Secondary | ICD-10-CM

## 2024-07-14 DIAGNOSIS — K2980 Duodenitis without bleeding: Secondary | ICD-10-CM

## 2024-07-14 LAB — CBC
HCT: 55.5 % — ABNORMAL HIGH (ref 39.0–52.0)
HCT: 53.8 % — ABNORMAL HIGH (ref 39.0–52.0)
Hemoglobin: 19.5 g/dL — ABNORMAL HIGH (ref 13.0–17.0)
Hemoglobin: 18.8 g/dL — ABNORMAL HIGH (ref 13.0–17.0)
MCH: 31.4 pg (ref 26.0–34.0)
MCH: 31.5 pg (ref 26.0–34.0)
MCHC: 35.1 g/dL (ref 30.0–36.0)
MCHC: 34.9 g/dL (ref 30.0–36.0)
MCV: 89.2 fL (ref 80.0–100.0)
MCV: 90.1 fL (ref 80.0–100.0)
Platelets: 302 10*3/uL (ref 150–400)
Platelets: 255 10*3/uL (ref 150–400)
RBC: 6.22 MIL/uL — ABNORMAL HIGH (ref 4.22–5.81)
RBC: 5.97 MIL/uL — ABNORMAL HIGH (ref 4.22–5.81)
RDW: 11.6 % (ref 11.5–15.5)
RDW: 11.6 % (ref 11.5–15.5)
WBC: 18.7 10*3/uL — ABNORMAL HIGH (ref 4.0–10.5)
WBC: 21.3 10*3/uL — ABNORMAL HIGH (ref 4.0–10.5)
nRBC: 0 % (ref 0.0–0.2)
nRBC: 0 % (ref 0.0–0.2)

## 2024-07-14 LAB — BASIC METABOLIC PANEL WITH GFR
Anion gap: 32 — ABNORMAL HIGH (ref 5–15)
BUN: 29 mg/dL — ABNORMAL HIGH (ref 6–20)
CO2: 8 mmol/L — ABNORMAL LOW (ref 22–32)
Calcium: 8.6 mg/dL — ABNORMAL LOW (ref 8.9–10.3)
Chloride: 95 mmol/L — ABNORMAL LOW (ref 98–111)
Creatinine, Ser: 1.55 mg/dL — ABNORMAL HIGH (ref 0.61–1.24)
GFR, Estimated: 53 mL/min — ABNORMAL LOW
Glucose, Bld: 190 mg/dL — ABNORMAL HIGH (ref 70–99)
Potassium: 4.8 mmol/L (ref 3.5–5.1)
Sodium: 135 mmol/L (ref 135–145)

## 2024-07-14 LAB — COMPREHENSIVE METABOLIC PANEL WITH GFR
ALT: 32 U/L (ref 0–44)
AST: 34 U/L (ref 15–41)
Albumin: 5 g/dL (ref 3.5–5.0)
Alkaline Phosphatase: 133 U/L — ABNORMAL HIGH (ref 38–126)
BUN: 29 mg/dL — ABNORMAL HIGH (ref 6–20)
CO2: <7 mmol/L — ABNORMAL LOW (ref 22–32)
Calcium: 10 mg/dL (ref 8.9–10.3)
Chloride: 91 mmol/L — ABNORMAL LOW (ref 98–111)
Creatinine, Ser: 1.47 mg/dL — ABNORMAL HIGH (ref 0.61–1.24)
GFR, Estimated: 56 mL/min — ABNORMAL LOW
Glucose, Bld: 237 mg/dL — ABNORMAL HIGH (ref 70–99)
Potassium: 5.1 mmol/L (ref 3.5–5.1)
Sodium: 134 mmol/L — ABNORMAL LOW (ref 135–145)
Total Bilirubin: 0.6 mg/dL (ref 0.0–1.2)
Total Protein: 8.8 g/dL — ABNORMAL HIGH (ref 6.5–8.1)

## 2024-07-14 LAB — TROPONIN T, HIGH SENSITIVITY
Troponin T High Sensitivity: 23 ng/L — ABNORMAL HIGH (ref 0–19)
Troponin T High Sensitivity: 21 ng/L — ABNORMAL HIGH (ref 0–19)

## 2024-07-14 LAB — URINALYSIS, ROUTINE W REFLEX MICROSCOPIC
Bacteria, UA: NONE SEEN
Bilirubin Urine: NEGATIVE
Glucose, UA: >=500 mg/dL — AB
Ketones, ur: 80 mg/dL — AB
Leukocytes,Ua: NEGATIVE
Nitrite: NEGATIVE
Protein, ur: 30 mg/dL — AB
Specific Gravity, Urine: 1.02 (ref 1.005–1.030)
pH: 5 (ref 5.0–8.0)

## 2024-07-14 LAB — TSH: TSH: 0.212 u[IU]/mL — ABNORMAL LOW (ref 0.350–4.500)

## 2024-07-14 LAB — BETA-HYDROXYBUTYRIC ACID: Beta-Hydroxybutyric Acid: >8 mmol/L — ABNORMAL HIGH (ref 0.05–0.27)

## 2024-07-14 LAB — BLOOD GAS, VENOUS
Acid-base deficit: 20.4 mmol/L — ABNORMAL HIGH (ref 0.0–2.0)
Bicarbonate: 8.3 mmol/L — ABNORMAL LOW (ref 20.0–28.0)
O2 Saturation: 83.7 %
Patient temperature: 37
pCO2, Ven: 28 mmHg — ABNORMAL LOW (ref 44–60)
pH, Ven: 7.08 — CL (ref 7.25–7.43)
pO2, Ven: 53 mmHg — ABNORMAL HIGH (ref 32–45)

## 2024-07-14 LAB — CBG MONITORING, ED
Glucose-Capillary: 223 mg/dL — ABNORMAL HIGH (ref 70–99)
Glucose-Capillary: 219 mg/dL — ABNORMAL HIGH (ref 70–99)
Glucose-Capillary: 232 mg/dL — ABNORMAL HIGH (ref 70–99)
Glucose-Capillary: 176 mg/dL — ABNORMAL HIGH (ref 70–99)
Glucose-Capillary: 171 mg/dL — ABNORMAL HIGH (ref 70–99)
Glucose-Capillary: 164 mg/dL — ABNORMAL HIGH (ref 70–99)

## 2024-07-14 LAB — LACTIC ACID, PLASMA
Lactic Acid, Venous: 1.9 mmol/L (ref 0.5–1.9)
Lactic Acid, Venous: 1.8 mmol/L (ref 0.5–1.9)

## 2024-07-14 LAB — D-DIMER, QUANTITATIVE: D-Dimer, Quant: 0.49 ug{FEU}/mL (ref 0.00–0.50)

## 2024-07-14 LAB — MAGNESIUM: Magnesium: 2.3 mg/dL (ref 1.7–2.4)

## 2024-07-14 LAB — LIPASE, BLOOD: Lipase: 51 U/L (ref 11–51)

## 2024-07-14 MED ORDER — ONDANSETRON HCL 4 MG PO TABS
4.0000 mg | ORAL_TABLET | Freq: Four times a day (QID) | ORAL | Status: DC | PRN
  Administered 2024-07-15: 4 mg via ORAL
  Filled 2024-07-14: qty 1

## 2024-07-14 MED ORDER — INSULIN REGULAR(HUMAN) IN NACL 100-0.9 UT/100ML-% IV SOLN
INTRAVENOUS | Status: DC
  Administered 2024-07-14: 4.2 [IU]/h via INTRAVENOUS
  Administered 2024-07-15: 2 [IU]/h via INTRAVENOUS
  Filled 2024-07-14 (×2): qty 100

## 2024-07-14 MED ORDER — ACETAMINOPHEN 650 MG RE SUPP: 650.0000 mg | Freq: Four times a day (QID) | RECTAL | Status: DC | PRN

## 2024-07-14 MED ORDER — IOHEXOL 300 MG/ML  SOLN
100.0000 mL | Freq: Once | INTRAMUSCULAR | Status: AC | PRN
  Administered 2024-07-14: 100 mL via INTRAVENOUS

## 2024-07-14 MED ORDER — SODIUM CHLORIDE 0.9 % IV SOLN: Freq: Once | INTRAVENOUS | Status: AC

## 2024-07-14 MED ORDER — ONDANSETRON HCL 4 MG/2ML IJ SOLN
4.0000 mg | Freq: Four times a day (QID) | INTRAMUSCULAR | Status: DC | PRN
  Administered 2024-07-15 (×2): 4 mg via INTRAVENOUS
  Filled 2024-07-14 (×2): qty 2

## 2024-07-14 MED ORDER — PROPRANOLOL HCL 40 MG PO TABS
80.0000 mg | ORAL_TABLET | Freq: Two times a day (BID) | ORAL | Status: DC
  Administered 2024-07-14 – 2024-07-17 (×6): 80 mg via ORAL
  Filled 2024-07-14: qty 4
  Filled 2024-07-14 (×2): qty 2
  Filled 2024-07-14 (×2): qty 4
  Filled 2024-07-14 (×2): qty 2

## 2024-07-14 MED ORDER — DEXTROSE IN LACTATED RINGERS 5 % IV SOLN: INTRAVENOUS | Status: DC

## 2024-07-14 MED ORDER — SODIUM CHLORIDE 0.9 % IV BOLUS
1000.0000 mL | Freq: Once | INTRAVENOUS | Status: AC
  Administered 2024-07-14: 1000 mL via INTRAVENOUS

## 2024-07-14 MED ORDER — INSULIN REGULAR(HUMAN) IN NACL 100-0.9 UT/100ML-% IV SOLN
INTRAVENOUS | Status: DC
  Administered 2024-07-14: 8 [IU]/h via INTRAVENOUS
  Filled 2024-07-14: qty 100

## 2024-07-14 MED ORDER — ACETAMINOPHEN 325 MG PO TABS
650.0000 mg | ORAL_TABLET | Freq: Four times a day (QID) | ORAL | Status: DC | PRN
  Administered 2024-07-14 – 2024-07-15 (×2): 650 mg via ORAL
  Filled 2024-07-14 (×2): qty 2

## 2024-07-14 MED ORDER — DEXTROSE 50 % IV SOLN: 0.0000 mL | INTRAVENOUS | Status: DC | PRN

## 2024-07-14 MED ORDER — ONDANSETRON HCL 4 MG/2ML IJ SOLN
4.0000 mg | Freq: Once | INTRAMUSCULAR | Status: AC
  Administered 2024-07-14: 4 mg via INTRAVENOUS
  Filled 2024-07-14: qty 2

## 2024-07-14 MED ORDER — DEXTROSE IN LACTATED RINGERS 5 % IV SOLN: INTRAVENOUS | Status: AC

## 2024-07-14 MED ORDER — ENOXAPARIN SODIUM 60 MG/0.6ML IJ SOSY
0.5000 mg/kg | PREFILLED_SYRINGE | INTRAMUSCULAR | Status: DC
  Administered 2024-07-14 – 2024-07-15 (×2): 45 mg via SUBCUTANEOUS
  Filled 2024-07-14 (×2): qty 0.6

## 2024-07-14 NOTE — H&P (Signed)
 " History and Physical    Patient: Dustin Williamson FMW:[id number] DOB: Feb 09, 1971 DOA: 07/14/2024 DOS: the patient was seen and examined on 07/14/2024 PCP: Lorel Maxie LABOR, MD  Patient coming from: Home  Chief Complaint:  Chief Complaint  Patient presents with   Emesis   HPI: Dustin Williamson is a 54 y.o. male with medical history significant of T2DM, HTN, HFpEF   Presents emergency department for evaluation of intractable nausea, vomiting since Saturday.  He denies hematemesis, fevers, abdominal pain, diarrhea, sick contacts. He was recently started on Ozempic and states he has not felt right since he started this and no longer wants to take it. Takes Jardiance .  Review of systems positive for intermittent chest pain, constipation, palpitations, shortness of breath, dizziness with standing, cough.   In the ED he was afebrile, and persistent sinus tachycardia up to 140, blood pressure slightly elevated, saturating appropriately on room air. On Chemistry, CO2 undetectably low,anion gap incalculable. Lactic acid reassuring. Glucose 237.  Cr 147,  alk phos 133. WBC 18.7. ABG with metabolic acidosis with pH 7.08. CT abdomen/pelvis reassuring.   Started on insulin  drip and admission requested for further management euglycemic DKA.     Review of Systems: Review of Systems  Constitutional:  Positive for weight loss. Negative for chills and fever.  Respiratory:  Positive for cough. Negative for hemoptysis and sputum production.   Cardiovascular:  Positive for chest pain and palpitations. Negative for leg swelling.  Gastrointestinal:  Positive for vomiting.  Genitourinary:  Negative for dysuria and urgency.  Musculoskeletal:  Negative for joint pain and myalgias.  Skin:  Negative for itching and rash.  Neurological:  Positive for dizziness. Negative for tingling, tremors, sensory change, speech change, focal weakness and headaches.    Past Medical History:  Diagnosis Date   Anxiety     Asthma    CHF (congestive heart failure) (HCC)    Depressed    Diabetes mellitus without complication (HCC)    Diastolic heart failure (HCC)    GERD (gastroesophageal reflux disease)    Heartburn    HLD (hyperlipidemia)    Hypertension    Kidney stone    Migraines    Neuropathy    Sleep apnea    Past Surgical History:  Procedure Laterality Date   CYSTOSCOPY W/ URETERAL STENT REMOVAL N/A 05/27/2016   Procedure: CYSTOSCOPY WITH STENT REMOVAL;  Surgeon: Gordy VEAR Cramp, MD;  Location: [facility] ORS;  Service: Urology;  Laterality: N/A;   CYSTOSCOPY WITH STENT PLACEMENT Left 05/05/2016   Procedure: CYSTOSCOPY WITH STENT PLACEMENT;  Surgeon: Rosina Riis, MD;  Location: [facility] ORS;  Service: Urology;  Laterality: Left;   HERNIA REPAIR     KNEE SURGERY Left 2009   Social History:  reports that he quit smoking about 24 years ago. His smoking use included cigarettes. He started smoking about 34 years ago. He has a 5 pack-year smoking history. He has never used smokeless tobacco. He reports current drug use. Drug: Marijuana. He reports that he does not drink alcohol.  Allergies[1]  Family History  Problem Relation Age of Onset   Hypertension Mother    High blood pressure Mother    Diabetes Mellitus II Father    Cancer Father    Multiple myeloma Father    Hypertension Brother    Dementia Paternal Aunt    Prostate cancer Neg Hx    Kidney disease Neg Hx    Bladder Cancer Neg Hx     Prior to Admission medications  Medication Sig Start Date End Date Taking? Authorizing Provider  amoxicillin -clavulanate (AUGMENTIN ) 875-125 MG tablet Take 1 tablet by mouth 2 (two) times daily. 05/16/23   Fisher, Devere ORN, PA-C  aspirin  81 MG EC tablet TAKE 1 TABLET BY MOUTH ONCE DAILY FOR HEART PROTECTION. Patient taking differently: Take 81 mg by mouth daily. 07/19/21     aspirin -acetaminophen -caffeine (EXCEDRIN MIGRAINE) 250-250-65 MG tablet Take 2 tablets by mouth every 6 (six) hours as needed for headache.     [provider]  atorvastatin  (LIPITOR) 80 MG tablet TAKE ONE TABLET BY MOUTH AT BEDTIME FOR HIGH CHOLESTEROL Patient taking differently: Take 80 mg by mouth daily. 03/16/21 12/01/22    busPIRone  (BUSPAR ) 15 MG tablet TAKE 1 TABLET BY MOUTH TWICE A DAY FOR MOOD. Patient taking differently: Take 15 mg by mouth 2 (two) times daily. 07/20/21     chlorhexidine  (PERIDEX ) 0.12 % solution Use as directed 15 mLs in the mouth or throat 2 (two) times daily. 04/15/24   Poggi, Jenna E, PA-C  DULoxetine  (CYMBALTA ) 30 MG capsule TAKE 3 CAPSULES BY MOUTH ONCE DAILY FOR MOOD AND PAIN. Patient taking differently: Take 90 mg by mouth daily. 07/19/21     empagliflozin  (JARDIANCE ) 25 MG TABS tablet TAKE 1 TABLET BY MOUTH ONCE DAILY FOR DIABETES. Patient taking differently: Take 25 mg by mouth daily. 07/19/21     furosemide  (LASIX ) 20 MG tablet TAKE 1 TABLET BY MOUTH ONCE DAILY FOR FLUID RETENTION. Patient taking differently: Take 20 mg by mouth daily. 07/19/21     gabapentin  (NEURONTIN ) 400 MG capsule TAKE 3 CAPSULES BY MOUTH EVERY MORNING AND AFTERNOON AND TAKE 4 CAPSULES BY MOUTH AT BEDTIME. Patient taking differently: Take 1,200-1,600 mg by mouth 3 (three) times daily. 07/06/21     glucose blood (RIGHTEST GS550 BLOOD GLUCOSE) test strip Use as directed 02/07/21   Harrison, Keri K, RPH  insulin  degludec (TRESIBA  FLEXTOUCH) 200 UNIT/ML FlexTouch Pen INJECT 160 UNITS UNDER THE SKIN ONCE DAILY. Patient taking differently: Inject 160 Units into the skin daily. Patient reports taking 120-160 units. 06/02/21     Insulin  Pen Needle (NOVOFINE PEN NEEDLE) 32G X 6 MM MISC USE AS DIRECTED 03/15/21   Harrison, Keri K, RPH  lisinopril  (ZESTRIL ) 2.5 MG tablet Take 2.5 mg by mouth daily. 11/20/22   [provider]  metFORMIN  (GLUCOPHAGE ) 1000 MG tablet TAKE ONE TABLET BY MOUTH 2 TIMES A DAY FOR DIABETES Patient taking differently: Take 1,000 mg by mouth 2 (two) times daily with a meal. 03/16/21 03/16/22    NOVOLOG  100  UNIT/ML injection INJECT 45 UNITS UNDER THE SKIN EVERY MORNING. THEN 40 UNITS AT LUNCH. THEN 40 UNITS AT DINNER. Patient taking differently: Inject 40-45 Units into the skin 3 (three) times daily with meals. Inject 45 units under the skin every morning at breakfast, 40 units at lunch and 40 units at dinner 07/22/20 07/22/21  Aycock, Ngwe A, MD  omeprazole  (PRILOSEC) 20 MG capsule TAKE 1 CAPSULE BY MOUTH ONCE DAILY FOR ACID REFLUX. Patient taking differently: Take 20 mg by mouth daily. 07/19/21     propranolol  (INDERAL ) 80 MG tablet Take 80 mg by mouth 2 (two) times daily. 11/20/22   [provider]  QUEtiapine  (SEROQUEL ) 25 MG tablet TAKE ONE TABLET BY MOUTH ONCE NIGHTLY. 05/05/21     Rightest GL300 Lancets MISC Use as directed 02/07/21   Harrison, Keri K, [facility]  spironolactone  (ALDACTONE ) 25 MG tablet TAKE (1/2) TABLET (12.5MG  TOTAL) BY MOUTH ONCE DAILY. 08/03/21  tamsulosin  (FLOMAX ) 0.4 MG CAPS capsule Take 1 capsule (0.4 mg total) by mouth daily. 05/16/23   Fisher, Devere ORN, PA-C  tiZANidine  (ZANAFLEX ) 4 MG tablet TAKE 1 TABLET BY MOUTH 4 TIMES DAILY AS NEEDED FOR MUSCLE SPASTICITY. 07/19/21     VENTOLIN  HFA 108 (90 Base) MCG/ACT inhaler INHALE 2 PUFFS EVERY 4 TO 6 HOURS AS NEEDED FOR WHEEZING. 12/21/20 12/21/21      Physical Exam: Vitals:   07/14/24 1403 07/14/24 1455 07/14/24 1626 07/14/24 1825  BP:  (!) 152/105 131/85 (!) 171/108  Pulse:  (!) 125 (!) 126 (!) 137  Resp:  (!) 22 15 20   Temp:  97.6 F (36.4 C) 98.9 F (37.2 C)   TempSrc:  Oral Oral   SpO2:  98% 99% 99%  Weight: 90.7 kg     Height: 5' 6 (1.676 m)      Physical Exam Vitals and nursing note reviewed.  Constitutional:      General: He is not in acute distress.    Appearance: He is not ill-appearing or toxic-appearing.  HENT:     Mouth/Throat:     Mouth: Mucous membranes are dry.  Eyes:     General: No scleral icterus.    Extraocular Movements: Extraocular movements intact.     Pupils: Pupils are equal, round, and  reactive to light.  Cardiovascular:     Rate and Rhythm: Regular rhythm. Tachycardia present.  Pulmonary:     Effort: Pulmonary effort is normal. No respiratory distress.     Breath sounds: Normal breath sounds.  Abdominal:     General: There is no distension.     Palpations: Abdomen is soft.     Tenderness: There is no abdominal tenderness.  Musculoskeletal:     Cervical back: Normal range of motion and neck supple.  Neurological:     General: No focal deficit present.     Mental Status: He is alert.  Psychiatric:        Mood and Affect: Mood normal.        Behavior: Behavior normal.     Data Reviewed:   Labs on Admission: I have personally reviewed following labs and imaging studies  CBC: Recent Labs  Lab 07/14/24 1422  WBC 18.7*  HGB 19.5*  HCT 55.5*  MCV 89.2  PLT 302   Basic Metabolic Panel: Recent Labs  Lab 07/14/24 1422  NA 134*  K 5.1  CL 91*  CO2 <7*  GLUCOSE 237*  BUN 29*  CREATININE 1.47*  CALCIUM  10.0   GFR: Estimated Creatinine Clearance: 60.6 mL/min (A) (by C-G formula based on SCr of 1.47 mg/dL (H)). Liver Function Tests: Recent Labs  Lab 07/14/24 1422  AST 34  ALT 32  ALKPHOS 133*  BILITOT 0.6  PROT 8.8*  ALBUMIN 5.0   Recent Labs  Lab 07/14/24 1422  LIPASE 51   No results for input(s): AMMONIA in the last 168 hours. Coagulation Profile: No results for input(s): INR, PROTIME in the last 168 hours. Cardiac Enzymes: No results for input(s): CKTOTAL, CKMB, CKMBINDEX, TROPONINI in the last 168 hours. BNP (last 3 results) No results for input(s): PROBNP in the last 8760 hours. HbA1C: No results for input(s): HGBA1C in the last 72 hours. CBG: Recent Labs  Lab 07/14/24 1402 07/14/24 1757 07/14/24 1903  GLUCAP 223* 219* 232*   Lipid Profile: No results for input(s): CHOL, HDL, LDLCALC, TRIG, CHOLHDL, LDLDIRECT in the last 72 hours. Thyroid Function Tests: No results for input(s): TSH,  T4TOTAL, FREET4,  T3FREE, THYROIDAB in the last 72 hours. Anemia Panel: No results for input(s): VITAMINB12, FOLATE, FERRITIN, TIBC, IRON, RETICCTPCT in the last 72 hours. Urine analysis:    Component Value Date/Time   COLORURINE YELLOW (A) 07/14/2024 1625   APPEARANCEUR CLEAR (A) 07/14/2024 1625   APPEARANCEUR Cloudy (A) 05/09/2016 1452   LABSPEC 1.020 07/14/2024 1625   LABSPEC 1.030 10/16/2014 1256   PHURINE 5.0 07/14/2024 1625   GLUCOSEU >=500 (A) 07/14/2024 1625   GLUCOSEU NEGATIVE 10/16/2014 1256   HGBUR SMALL (A) 07/14/2024 1625   BILIRUBINUR NEGATIVE 07/14/2024 1625   BILIRUBINUR Negative 05/09/2016 1452   BILIRUBINUR NEGATIVE 10/16/2014 1256   KETONESUR 80 (A) 07/14/2024 1625   PROTEINUR 30 (A) 07/14/2024 1625   UROBILINOGEN 0.2 03/05/2009 1613   NITRITE NEGATIVE 07/14/2024 1625   LEUKOCYTESUR NEGATIVE 07/14/2024 1625   LEUKOCYTESUR NEGATIVE 10/16/2014 1256    Radiological Exams on Admission: CT ABDOMEN PELVIS W CONTRAST Result Date: 07/14/2024 CLINICAL DATA:  Abdominal pain nausea vomiting EXAM: CT ABDOMEN AND PELVIS WITH CONTRAST TECHNIQUE: Multidetector CT imaging of the abdomen and pelvis was performed using the standard protocol following bolus administration of intravenous contrast. RADIATION DOSE REDUCTION: This exam was performed according to the departmental dose-optimization program which includes automated exposure control, adjustment of the mA and/or kV according to patient size and/or use of iterative reconstruction technique. CONTRAST:  OMNIPAQUE  IOHEXOL  300 MG/ML  SOLN COMPARISON:  CT 05/16/2023, 11/30/2022 FINDINGS: Lower chest: Lung bases are clear. Mild circumferential distal esophageal thickening. Hepatobiliary: No focal liver abnormality is seen. No gallstones, gallbladder wall thickening, or biliary dilatation. Pancreas: Unremarkable. No pancreatic ductal dilatation or surrounding inflammatory changes. Spleen: Normal in size without  focal abnormality. Adrenals/Urinary Tract: Adrenal glands are normal. No hydronephrosis. Small nonobstructing bilateral kidney stones. The bladder is unremarkable Stomach/Bowel: Stomach nonenlarged. No dilated small bowel. Decompressed appearance of colon. Negative appendix. Vascular/Lymphatic: No significant vascular findings are present. No enlarged abdominal or pelvic lymph nodes. Reproductive: Prostate is unremarkable. Other: No ascites or free air Musculoskeletal: No acute or suspicious osseous abnormality IMPRESSION: 1. No CT evidence for acute intra-abdominal or pelvic abnormality. 2. Mild circumferential distal esophageal thickening, question esophagitis or reflux. 3. Small nonobstructing bilateral kidney stones. Electronically Signed   By: Luke Bun M.D.   On: 07/14/2024 17:38       Assessment and Plan:  Euglycemic DKA  T2DM - pt on jardiance  and recently started Ozempic. If n/v persist after resolution of DKA consider d/c Ozempic  - endotool protocol  - serial BMP   Sinus tachycardia  HTN - resume propranolol , continue IVF, monitor on telemetry  - given report of chest pain check trop, EKG, d-dimer  - monitor/replace electrolytes, check TSH   AKI  -  Cr 1.47, baseline unclear but Cr 1 in Nov 2024 - avoid nephrotoxic agents - monitor on daily labs   HFpEF - stable, hypovolemic  - resume home medications when reconciled   NPO  D5LR @150  Monitor/replace electrolytes  Lovenox     Advance Care Planning:   Code Status: Full Code    Severity of Illness: The appropriate patient status for this patient is INPATIENT. Inpatient status is judged to be reasonable and necessary in order to provide the required intensity of service to ensure the patient's safety. The patient's presenting symptoms, physical exam findings, and initial radiographic and laboratory data in the context of their chronic comorbidities is felt to place them at high risk for further clinical deterioration.  Furthermore, it is not anticipated that  the patient will be medically stable for discharge from the hospital within 2 midnights of admission.   * I certify that at the point of admission it is my clinical judgment that the patient will require inpatient hospital care spanning beyond 2 midnights from the point of admission due to high intensity of service, high risk for further deterioration and high frequency of surveillance required.*  Author: Daved JAYSON Pump, DO 07/14/2024 7:37 PM  For on call review [email or web address].      [1] No Known Allergies  "

## 2024-07-14 NOTE — ED Triage Notes (Signed)
 BIBEMS from home. Pt c/o of N/V that has been going on for 3 days. Pt has been unable to eat or drink d/t not being able to keep anything down. Pt has hx of type 2 diabetes, uncontrolled. Pt also states they have been constipated for 3 days as well, abdomen tender on palpation, no distention noted. Hx of HTN and CHF.   EMS VS: 215/ palp 132 HR 100% on RA 205 CBG 98.1 F

## 2024-07-14 NOTE — ED Provider Notes (Signed)
----------------------------------------- °  6:07 PM on 07/14/2024 -----------------------------------------  I took over care of this patient from Dr. Arlander.  CMP showed significant abnormalities.  Glucose is only 237, but the bicarb was below 7 and anion gap is not calculable.  Potassium is normal.  Although the glucose is not severely elevated, this is concerning for DKA.  I added on a lactate which was not elevated, and a VBG which showed a pH of 7.08.  I also ordered a CT abdomen/pelvis given the persistent nausea and vomiting.  This is negative for acute findings.  CT abdomen/pelvis: I independently viewed and interpreted the images; there are no dilated bowel loops or any free air or free fluid.  Radiology report indicates the following:  IMPRESSION:  1. No CT evidence for acute intra-abdominal or pelvic abnormality.  2. Mild circumferential distal esophageal thickening, question  esophagitis or reflux.  3. Small nonobstructing bilateral kidney stones.    Given the concern for DKA, after giving a total of 2 L of NS, I started the patient on a D5 LR infusion along with a continuous insulin  infusion.  He will need admission for further management.  I consulted the hospitalist; based on our discussion she agreed to evaluate the patient for admission.    Jacolyn Pae, MD 07/14/24 [id number]

## 2024-07-14 NOTE — ED Notes (Signed)
Ice chips given per pt request

## 2024-07-14 NOTE — ED Provider Notes (Signed)
" ° °  [facility] Provider Note    Event Date/Time   First MD Initiated Contact with Patient 07/14/24 1354     (approximate)   History   Emesis   HPI  Dustin Williamson is a 54 y.o. male with history of diabetes, CHF, hypertension, sepsis who presents with complaints of nausea and vomiting.  He denies abdominal pain.  Reports symptoms been ongoing for 3 days     Physical Exam   Triage Vital Signs: ED Triage Vitals [07/14/24 1403]  Encounter Vitals Group     BP      Girls Systolic BP Percentile      Girls Diastolic BP Percentile      Boys Systolic BP Percentile      Boys Diastolic BP Percentile      Pulse      Resp      Temp      Temp src      SpO2      Weight 90.7 kg (200 lb)     Height 1.676 m (5' 6)     Head Circumference      Peak Flow      Pain Score 10     Pain Loc      Pain Education      Exclude from Growth Chart     Most recent vital signs: There were no vitals filed for this visit.   General: Awake, no distress.  CV:  Good peripheral perfusion.  Tachycardia Resp:  Normal effort.  Abd:  No distention.  Soft, nontender, reassuring exam Other:     ED Results / Procedures / Treatments   Labs (all labs ordered are listed, but only abnormal results are displayed) Labs Reviewed  CBG MONITORING, ED - Abnormal; Notable for the following components:      Result Value   Glucose-Capillary 223 (*)    All other components within normal limits  CBC  COMPREHENSIVE METABOLIC PANEL WITH GFR  LIPASE, BLOOD     EKG    RADIOLOGY     PROCEDURES:  Critical Care performed:   Procedures   MEDICATIONS ORDERED IN ED: Medications  0.9 %  sodium chloride  infusion ( Intravenous New Bag/Given 07/14/24 1445)  ondansetron  (ZOFRAN ) injection 4 mg (4 mg Intravenous Given 07/14/24 1444)     IMPRESSION / MDM / ASSESSMENT AND PLAN / ED COURSE  I reviewed the triage vital signs and the nursing notes. Patient's presentation is most  consistent with acute presentation with potential threat to life or bodily function.   Patient presents with nausea vomiting as detailed above.  Reassuring abdominal exam.  Differential includes gastritis, viral illness, doubt DKA given reassuring fingerstick, pending labs.  Will treat with IV fluids, IV Zofran   Have asked my colleague to follow-up on labs/reevaluate       FINAL CLINICAL IMPRESSION(S) / ED DIAGNOSES   Final diagnoses:  Nausea and vomiting, unspecified vomiting type     Rx / DC Orders   ED Discharge Orders     None        Note:  This document was prepared using Dragon voice recognition software and may include unintentional dictation errors.   Arlander Charleston, MD 07/14/24 1454  "

## 2024-07-15 ENCOUNTER — Encounter: Payer: Self-pay | Admitting: Emergency Medicine

## 2024-07-15 DIAGNOSIS — E1110 Type 2 diabetes mellitus with ketoacidosis without coma: Secondary | ICD-10-CM | POA: Diagnosis not present

## 2024-07-15 LAB — BASIC METABOLIC PANEL WITH GFR
Anion gap: 24 — ABNORMAL HIGH (ref 5–15)
Anion gap: 18 — ABNORMAL HIGH (ref 5–15)
Anion gap: 17 — ABNORMAL HIGH (ref 5–15)
Anion gap: 16 — ABNORMAL HIGH (ref 5–15)
Anion gap: 14 (ref 5–15)
BUN: 27 mg/dL — ABNORMAL HIGH (ref 6–20)
BUN: 26 mg/dL — ABNORMAL HIGH (ref 6–20)
BUN: 23 mg/dL — ABNORMAL HIGH (ref 6–20)
BUN: 20 mg/dL (ref 6–20)
BUN: 19 mg/dL (ref 6–20)
CO2: 15 mmol/L — ABNORMAL LOW (ref 22–32)
CO2: 16 mmol/L — ABNORMAL LOW (ref 22–32)
CO2: 16 mmol/L — ABNORMAL LOW (ref 22–32)
CO2: 18 mmol/L — ABNORMAL LOW (ref 22–32)
CO2: 19 mmol/L — ABNORMAL LOW (ref 22–32)
Calcium: 9.1 mg/dL (ref 8.9–10.3)
Calcium: 8.9 mg/dL (ref 8.9–10.3)
Calcium: 9.1 mg/dL (ref 8.9–10.3)
Calcium: 9.4 mg/dL (ref 8.9–10.3)
Calcium: 8.5 mg/dL — ABNORMAL LOW (ref 8.9–10.3)
Chloride: 100 mmol/L (ref 98–111)
Chloride: 101 mmol/L (ref 98–111)
Chloride: 102 mmol/L (ref 98–111)
Chloride: 101 mmol/L (ref 98–111)
Chloride: 102 mmol/L (ref 98–111)
Creatinine, Ser: 1.64 mg/dL — ABNORMAL HIGH (ref 0.61–1.24)
Creatinine, Ser: 1.44 mg/dL — ABNORMAL HIGH (ref 0.61–1.24)
Creatinine, Ser: 1.21 mg/dL (ref 0.61–1.24)
Creatinine, Ser: 1.12 mg/dL (ref 0.61–1.24)
Creatinine, Ser: 1.13 mg/dL (ref 0.61–1.24)
GFR, Estimated: 49 mL/min — ABNORMAL LOW
GFR, Estimated: 58 mL/min — ABNORMAL LOW
GFR, Estimated: >60 mL/min
GFR, Estimated: >60 mL/min
GFR, Estimated: >60 mL/min
Glucose, Bld: 195 mg/dL — ABNORMAL HIGH (ref 70–99)
Glucose, Bld: 155 mg/dL — ABNORMAL HIGH (ref 70–99)
Glucose, Bld: 150 mg/dL — ABNORMAL HIGH (ref 70–99)
Glucose, Bld: 136 mg/dL — ABNORMAL HIGH (ref 70–99)
Glucose, Bld: 159 mg/dL — ABNORMAL HIGH (ref 70–99)
Potassium: 4.5 mmol/L (ref 3.5–5.1)
Potassium: 4.3 mmol/L (ref 3.5–5.1)
Potassium: 3.9 mmol/L (ref 3.5–5.1)
Potassium: 3.9 mmol/L (ref 3.5–5.1)
Potassium: 3.7 mmol/L (ref 3.5–5.1)
Sodium: 138 mmol/L (ref 135–145)
Sodium: 135 mmol/L (ref 135–145)
Sodium: 135 mmol/L (ref 135–145)
Sodium: 134 mmol/L — ABNORMAL LOW (ref 135–145)
Sodium: 134 mmol/L — ABNORMAL LOW (ref 135–145)

## 2024-07-15 LAB — CBG MONITORING, ED
Glucose-Capillary: 133 mg/dL — ABNORMAL HIGH (ref 70–99)
Glucose-Capillary: 136 mg/dL — ABNORMAL HIGH (ref 70–99)
Glucose-Capillary: 143 mg/dL — ABNORMAL HIGH (ref 70–99)
Glucose-Capillary: 143 mg/dL — ABNORMAL HIGH (ref 70–99)
Glucose-Capillary: 144 mg/dL — ABNORMAL HIGH (ref 70–99)
Glucose-Capillary: 145 mg/dL — ABNORMAL HIGH (ref 70–99)
Glucose-Capillary: 145 mg/dL — ABNORMAL HIGH (ref 70–99)
Glucose-Capillary: 145 mg/dL — ABNORMAL HIGH (ref 70–99)
Glucose-Capillary: 146 mg/dL — ABNORMAL HIGH (ref 70–99)
Glucose-Capillary: 148 mg/dL — ABNORMAL HIGH (ref 70–99)
Glucose-Capillary: 151 mg/dL — ABNORMAL HIGH (ref 70–99)
Glucose-Capillary: 152 mg/dL — ABNORMAL HIGH (ref 70–99)
Glucose-Capillary: 156 mg/dL — ABNORMAL HIGH (ref 70–99)
Glucose-Capillary: 159 mg/dL — ABNORMAL HIGH (ref 70–99)
Glucose-Capillary: 169 mg/dL — ABNORMAL HIGH (ref 70–99)
Glucose-Capillary: 170 mg/dL — ABNORMAL HIGH (ref 70–99)
Glucose-Capillary: 171 mg/dL — ABNORMAL HIGH (ref 70–99)
Glucose-Capillary: 177 mg/dL — ABNORMAL HIGH (ref 70–99)
Glucose-Capillary: 185 mg/dL — ABNORMAL HIGH (ref 70–99)
Glucose-Capillary: 239 mg/dL — ABNORMAL HIGH (ref 70–99)

## 2024-07-15 LAB — CBC
HCT: 48.9 % (ref 39.0–52.0)
Hemoglobin: 17.3 g/dL — ABNORMAL HIGH (ref 13.0–17.0)
MCH: 31.3 pg (ref 26.0–34.0)
MCHC: 35.4 g/dL (ref 30.0–36.0)
MCV: 88.4 fL (ref 80.0–100.0)
Platelets: 275 10*3/uL (ref 150–400)
RBC: 5.53 MIL/uL (ref 4.22–5.81)
RDW: 11.7 % (ref 11.5–15.5)
WBC: 19.5 10*3/uL — ABNORMAL HIGH (ref 4.0–10.5)
nRBC: 0 % (ref 0.0–0.2)

## 2024-07-15 LAB — BETA-HYDROXYBUTYRIC ACID
Beta-Hydroxybutyric Acid: 4.88 mmol/L — ABNORMAL HIGH (ref 0.05–0.27)
Beta-Hydroxybutyric Acid: 2.75 mmol/L — ABNORMAL HIGH (ref 0.05–0.27)
Beta-Hydroxybutyric Acid: 2.69 mmol/L — ABNORMAL HIGH (ref 0.05–0.27)
Beta-Hydroxybutyric Acid: 1.81 mmol/L — ABNORMAL HIGH (ref 0.05–0.27)

## 2024-07-15 LAB — MAGNESIUM: Magnesium: 2.3 mg/dL (ref 1.7–2.4)

## 2024-07-15 LAB — HEMOGLOBIN A1C
Hgb A1c MFr Bld: 12.2 % — ABNORMAL HIGH (ref 4.8–5.6)
Mean Plasma Glucose: 303.44 mg/dL

## 2024-07-15 LAB — PHOSPHORUS: Phosphorus: 2.4 mg/dL — ABNORMAL LOW (ref 2.5–4.6)

## 2024-07-15 LAB — T4, FREE: Free T4: 1.22 ng/dL (ref 0.80–2.00)

## 2024-07-15 MED ORDER — TAMSULOSIN HCL 0.4 MG PO CAPS
0.4000 mg | ORAL_CAPSULE | Freq: Every day | ORAL | Status: DC
  Administered 2024-07-15 – 2024-07-17 (×3): 0.4 mg via ORAL
  Filled 2024-07-15 (×3): qty 1

## 2024-07-15 MED ORDER — ASPIRIN 81 MG PO TBEC
81.0000 mg | DELAYED_RELEASE_TABLET | Freq: Every day | ORAL | Status: DC
  Administered 2024-07-15 – 2024-07-17 (×3): 81 mg via ORAL
  Filled 2024-07-15 (×3): qty 1

## 2024-07-15 MED ORDER — DULOXETINE HCL 60 MG PO CPEP
90.0000 mg | ORAL_CAPSULE | Freq: Every day | ORAL | Status: DC
  Administered 2024-07-15 – 2024-07-17 (×3): 90 mg via ORAL
  Filled 2024-07-15: qty 1
  Filled 2024-07-15: qty 3
  Filled 2024-07-15: qty 1

## 2024-07-15 MED ORDER — INSULIN GLARGINE-YFGN 100 UNIT/ML ~~LOC~~ SOLN
14.0000 [IU] | Freq: Every day | SUBCUTANEOUS | Status: DC
  Administered 2024-07-15 – 2024-07-17 (×3): 14 [IU] via SUBCUTANEOUS
  Filled 2024-07-15 (×3): qty 0.14

## 2024-07-15 MED ORDER — BUSPIRONE HCL 5 MG PO TABS
15.0000 mg | ORAL_TABLET | Freq: Two times a day (BID) | ORAL | Status: DC
  Administered 2024-07-15 – 2024-07-17 (×5): 15 mg via ORAL
  Filled 2024-07-15: qty 3
  Filled 2024-07-15 (×2): qty 2
  Filled 2024-07-15 (×2): qty 3

## 2024-07-15 MED ORDER — LORATADINE 10 MG PO TABS
10.0000 mg | ORAL_TABLET | Freq: Every day | ORAL | Status: DC
  Administered 2024-07-15 – 2024-07-17 (×3): 10 mg via ORAL
  Filled 2024-07-15 (×3): qty 1

## 2024-07-15 MED ORDER — PANTOPRAZOLE SODIUM 40 MG PO TBEC
40.0000 mg | DELAYED_RELEASE_TABLET | Freq: Every day | ORAL | Status: DC
  Administered 2024-07-15 – 2024-07-17 (×3): 40 mg via ORAL
  Filled 2024-07-15 (×3): qty 1

## 2024-07-15 MED ORDER — SODIUM PHOSPHATES 45 MMOLE/15ML IV SOLN
15.0000 mmol | Freq: Once | INTRAVENOUS | Status: AC
  Administered 2024-07-15: 15 mmol via INTRAVENOUS
  Filled 2024-07-15: qty 5

## 2024-07-15 MED ORDER — INSULIN ASPART 100 UNIT/ML IJ SOLN
0.0000 [IU] | Freq: Three times a day (TID) | INTRAMUSCULAR | Status: DC
  Administered 2024-07-16: 2 [IU] via SUBCUTANEOUS
  Administered 2024-07-16: 3 [IU] via SUBCUTANEOUS
  Administered 2024-07-16: 2 [IU] via SUBCUTANEOUS
  Filled 2024-07-15 (×2): qty 2
  Filled 2024-07-15: qty 3

## 2024-07-15 MED ORDER — HYDROMORPHONE HCL 1 MG/ML IJ SOLN
0.5000 mg | INTRAMUSCULAR | Status: DC | PRN
  Administered 2024-07-15 – 2024-07-17 (×6): 0.5 mg via INTRAVENOUS
  Filled 2024-07-15 (×6): qty 0.5

## 2024-07-15 MED ORDER — POTASSIUM CHLORIDE CRYS ER 20 MEQ PO TBCR
40.0000 meq | EXTENDED_RELEASE_TABLET | Freq: Once | ORAL | Status: AC
  Administered 2024-07-15: 40 meq via ORAL
  Filled 2024-07-15: qty 2

## 2024-07-15 MED ORDER — ATORVASTATIN CALCIUM 20 MG PO TABS
80.0000 mg | ORAL_TABLET | Freq: Every day | ORAL | Status: DC
  Administered 2024-07-15 – 2024-07-17 (×3): 80 mg via ORAL
  Filled 2024-07-15 (×3): qty 4

## 2024-07-15 MED ORDER — HYDROMORPHONE HCL 2 MG PO TABS: 1.0000 mg | ORAL_TABLET | Freq: Four times a day (QID) | ORAL | Status: DC | PRN

## 2024-07-15 NOTE — Inpatient Diabetes Management (Signed)
 Inpatient Diabetes Program Recommendations  AACE/ADA: New Consensus Statement on Inpatient Glycemic Control (2015)  Target Ranges:  Prepandial:   less than 140 mg/dL      Peak postprandial:   less than 180 mg/dL (1-2 hours)      Critically ill patients:  140 - 180 mg/dL   Lab Results  Component Value Date   GLUCAP 143 (H) 07/15/2024   HGBA1C 12.2 (H) 07/15/2024    Latest Reference Range & Units 07/15/24 06:18 07/15/24 07:09 07/15/24 08:08  Glucose-Capillary 70 - 99 mg/dL 855 (H) 843 (H) 856 (H)  (H): Data is abnormally high  Latest Reference Range & Units 07/15/24 07:22  Beta-Hydroxybutyric Acid 0.05 - 0.27 mmol/L 2.69 (H)  (H): Data is abnormally high  Latest Reference Range & Units 07/15/24 07:22  Sodium 135 - 145 mmol/L 135  Potassium 3.5 - 5.1 mmol/L 3.9  Chloride 98 - 111 mmol/L 102  CO2 22 - 32 mmol/L 16 (L)  Glucose 70 - 99 mg/dL 849 (H)  BUN 6 - 20 mg/dL 23 (H)  Creatinine 9.38 - 1.24 mg/dL 8.78  Calcium  8.9 - 10.3 mg/dL 9.1  Anion gap 5 - 15  17 (H)  GFR, Estimated >60 mL/min >60  (L): Data is abnormally low (H): Data is abnormally high  Diabetes history: DM2 Outpatient Diabetes medications:  Jardiance  25 mg daily Metformin  1 gm bid Ozempic weekly Current orders for Inpatient glycemic control: IV insulin   Inpatient Diabetes Program Recommendations:   Noted patient in euglycemic DKA. Beta remains 2.69. Noted patient on Jardiance  and recently started on Ozempic. On discharge, D/C Jardiance  and Ozempic and followup with PCP. Agree with continued IV insulin  and noted labs are improving with D5LR.  Thank you, Kendal Ghazarian E. Hortense Cantrall, RN, MSN, CNS, CDCES  Diabetes Coordinator Inpatient Glycemic Control Team Team Pager [phone] (8am-5pm) 07/15/2024 8:37 AM

## 2024-07-15 NOTE — ED Notes (Signed)
 Insulin  infusion stopped per MD orders  cbg 239 after eating dinner, long acting insulin  given prior to dinner

## 2024-07-15 NOTE — ED Notes (Signed)
 Pt requested food, NPO order in place. Message was sent to [facility], MD. MD was also notified of CBG being within ordered range since 0800PM last night. Goal range: (140-180). Pts CBG has ranged between 130-170 since last night.

## 2024-07-15 NOTE — ED Notes (Signed)
 Glucose 156 mg/dL EndoX1 for DKA Goal Range 200-250 mg/dL

## 2024-07-15 NOTE — ED Notes (Signed)
 Patient having episode of emesis. MD alerted to increase in feeling of nausea and vomiting. No new orders a this time.

## 2024-07-15 NOTE — ED Notes (Signed)
 Messaged Ponala, MD about patients increasing headache with 10/10 pain starting in posterior neck and going up into head. Pressures are also increasing at this time, 169/123 (138). No new orders at this time. Tylenol  was given at 0928 with minimal relief, lights turned off and pt was re-positioned to attempt to improve pain and discomfort.

## 2024-07-15 NOTE — Progress Notes (Signed)
 " PROGRESS NOTE    Dustin Williamson  FMW:[id number] DOB: 1970/12/29 DOA: 07/14/2024 PCP: Lorel Maxie LABOR, MD  Chief Complaint  Patient presents with   Emesis    Hospital Course:  Dustin Williamson is a 54 y.o. male with medical history significant of T2DM, HTN, HFpEF admitted due to intractable nausea, vomiting since Saturday.  Was recently started on Ozempic, states he has not felt right and does not want to take it. Workup revealed DKA, admitted for further management.  Hospital course as below  Subjective: Patient was examined at the bedside in the ER, new to me today. Reports continues to have nausea but significantly better compared to presentation. DKA resolved, bridged with long-acting insulin  Anticipate discharge tomorrow   Objective: Vitals:   07/15/24 0300 07/15/24 0342 07/15/24 0710 07/15/24 0809  BP: (!) 121/91  (!) 135/93   Pulse: 88  92   Resp: (!) 21  15   Temp:  98.5 F (36.9 C)  98.3 F (36.8 C)  TempSrc:  Oral  Oral  SpO2: 97%  100%   Weight:      Height:        Intake/Output Summary (Last 24 hours) at 07/15/2024 9173 Last data filed at 07/15/2024 0530 Gross per 24 hour  Intake 42.83 ml  Output 750 ml  Net -707.17 ml   Filed Weights   07/14/24 1403  Weight: 90.7 kg    Examination: Constitutional:      General: He is not in acute distress Cardiovascular:     Rate and Rhythm: RRR Pulmonary:     Effort: Pulmonary effort is normal. No respiratory distress.     Breath sounds: Normal breath sounds.  Abdominal:     General: There is no distension.     Palpations: Abdomen is soft.     Tenderness: There is no abdominal tenderness.  Musculoskeletal:     Cervical back: Normal range of motion and neck supple.  Neurological:     General: No focal deficit present.     Mental Status: He is alert.  Psychiatric:        Mood and Affect: Mood normal.        Behavior: Behavior normal  Assessment & Plan:  Euglycemic DKA  Uncontrolled T2DM HbA1c 12.2 Home  meds: Jardiance  25 mg daily, metformin  1000 mg bid.  Was recently started on Ozempic Reports was on insulin  until 6 months ago, and was discontinued due to hypoglycemia DKA resolved s/p IV insulin  Bridged with Lantus  14 units, SSI Consistent carb diet, discontinue IV fluids Diabetes coordinator consult  Hypokalemia Hypophosphatemia Monitor and replete as needed   HTN Troponin - downtrended, suspect likely due to demand On propranolol , resume lisinopril  as BP tolerates  Leukocytosis Suspect reactive, no symptoms/signs of infection UA neg for UTI, CT abd/pelvis negative for acute abnormality Monitor WBC  AKI  - Resolved Likely pre-renal, S/p IV fluids Monitor Cr  HFpEF stable, monitor volume status  Abnormal TSH ?Hyperthyrodism TSH 0.212,  FT4 pending  GERD PPI  Obesity class I Body mass index is 32.28 kg/m. Outpatient follow up for lifestyle modification and risk factor management  Incidental findings Small nonobstructing bilateral kidney stones   DVT prophylaxis: Lovenox  SQ   Code Status: Full Code Disposition:  Home  Consultants:  None  Procedures:  None  Antimicrobials:  Anti-infectives (From admission, onward)    None       Data Reviewed: I have personally reviewed following labs and imaging studies CBC: Recent Labs  Lab 07/14/24 1422 07/14/24 2008 07/15/24 0320  WBC 18.7* 21.3* 19.5*  HGB 19.5* 18.8* 17.3*  HCT 55.5* 53.8* 48.9  MCV 89.2 90.1 88.4  PLT 302 255 275   Basic Metabolic Panel: Recent Labs  Lab 07/14/24 1422 07/14/24 2008 07/15/24 0038 07/15/24 0240 07/15/24 0722  NA 134* 135 138 135 135  K 5.1 4.8 4.5 4.3 3.9  CL 91* 95* 100 101 102  CO2 <7* 8* 15* 16* 16*  GLUCOSE 237* 190* 195* 155* 150*  BUN 29* 29* 27* 26* 23*  CREATININE 1.47* 1.55* 1.64* 1.44* 1.21  CALCIUM  10.0 8.6* 9.1 8.9 9.1  MG  --  2.3  --   --   --    GFR: Estimated Creatinine Clearance: 73.6 mL/min (by C-G formula based on SCr of 1.21  mg/dL). Liver Function Tests: Recent Labs  Lab 07/14/24 1422  AST 34  ALT 32  ALKPHOS 133*  BILITOT 0.6  PROT 8.8*  ALBUMIN 5.0   CBG: Recent Labs  Lab 07/15/24 0321 07/15/24 0448 07/15/24 0618 07/15/24 0709 07/15/24 0808  GLUCAP 143* 133* 144* 156* 143*    No results found for this or any previous visit (from the past 240 hours).   Radiology Studies: CT ABDOMEN PELVIS W CONTRAST Result Date: 07/14/2024 CLINICAL DATA:  Abdominal pain nausea vomiting EXAM: CT ABDOMEN AND PELVIS WITH CONTRAST TECHNIQUE: Multidetector CT imaging of the abdomen and pelvis was performed using the standard protocol following bolus administration of intravenous contrast. RADIATION DOSE REDUCTION: This exam was performed according to the departmental dose-optimization program which includes automated exposure control, adjustment of the mA and/or kV according to patient size and/or use of iterative reconstruction technique. CONTRAST:  OMNIPAQUE  IOHEXOL  300 MG/ML  SOLN COMPARISON:  CT 05/16/2023, 11/30/2022 FINDINGS: Lower chest: Lung bases are clear. Mild circumferential distal esophageal thickening. Hepatobiliary: No focal liver abnormality is seen. No gallstones, gallbladder wall thickening, or biliary dilatation. Pancreas: Unremarkable. No pancreatic ductal dilatation or surrounding inflammatory changes. Spleen: Normal in size without focal abnormality. Adrenals/Urinary Tract: Adrenal glands are normal. No hydronephrosis. Small nonobstructing bilateral kidney stones. The bladder is unremarkable Stomach/Bowel: Stomach nonenlarged. No dilated small bowel. Decompressed appearance of colon. Negative appendix. Vascular/Lymphatic: No significant vascular findings are present. No enlarged abdominal or pelvic lymph nodes. Reproductive: Prostate is unremarkable. Other: No ascites or free air Musculoskeletal: No acute or suspicious osseous abnormality IMPRESSION: 1. No CT evidence for acute intra-abdominal or pelvic  abnormality. 2. Mild circumferential distal esophageal thickening, question esophagitis or reflux. 3. Small nonobstructing bilateral kidney stones. Electronically Signed   By: Luke Bun M.D.   On: 07/14/2024 17:38    Scheduled Meds:  enoxaparin  (LOVENOX ) injection  0.5 mg/kg Subcutaneous Q24H   propranolol   80 mg Oral BID   Continuous Infusions:  dextrose  5% lactated ringers  Stopped (07/14/24 2011)   dextrose  5% lactated ringers  125 mL/hr at 07/15/24 0333   insulin  1.8 Units/hr (07/15/24 0810)     LOS: 1 day  MDM: Patient is high risk for one or more organ failure.  They necessitate ongoing hospitalization for continued IV therapies and subsequent lab monitoring. Total time spent interpreting labs and vitals, reviewing the medical record, coordinating care amongst consultants and care team members, directly assessing and discussing care with the patient and/or family: 55 min Laree Lock, MD [facility]  To contact the attending physician between 7A-7P please use Epic Chat. To contact the covering physician during after hours 7P-7A, please review Amion.  07/15/2024, 8:26 AM   *  This document has been created with the assistance of dictation software. Please excuse typographical errors. *   "

## 2024-07-15 NOTE — Progress Notes (Signed)
" ° °  Brief Progress Note   _____________________________________________________________________________________________________________  Patient Name: Dustin Williamson Patient DOB: 09-07-1970 Date: @TODAY @      Data: Reviewed labs, notes, VS.     Action: No action needed at this time.      Response:    _____________________________________________________________________________________________________________  [facility] RN Expeditor Sharolyn JONETTA Batman Please contact us  directly via secure chat (search for [facility]) or by calling us  at [phone] [facility]).  "

## 2024-07-16 ENCOUNTER — Other Ambulatory Visit (HOSPITAL_COMMUNITY): Payer: Self-pay

## 2024-07-16 ENCOUNTER — Encounter: Payer: Self-pay | Admitting: Emergency Medicine

## 2024-07-16 ENCOUNTER — Telehealth (HOSPITAL_COMMUNITY): Payer: Self-pay

## 2024-07-16 DIAGNOSIS — R1319 Other dysphagia: Secondary | ICD-10-CM

## 2024-07-16 DIAGNOSIS — R933 Abnormal findings on diagnostic imaging of other parts of digestive tract: Secondary | ICD-10-CM | POA: Diagnosis not present

## 2024-07-16 DIAGNOSIS — E1110 Type 2 diabetes mellitus with ketoacidosis without coma: Secondary | ICD-10-CM

## 2024-07-16 DIAGNOSIS — K2289 Other specified disease of esophagus: Secondary | ICD-10-CM

## 2024-07-16 LAB — BASIC METABOLIC PANEL WITH GFR
Anion gap: 17 — ABNORMAL HIGH (ref 5–15)
BUN: 13 mg/dL (ref 6–20)
CO2: 16 mmol/L — ABNORMAL LOW (ref 22–32)
Calcium: 7.9 mg/dL — ABNORMAL LOW (ref 8.9–10.3)
Chloride: 102 mmol/L (ref 98–111)
Creatinine, Ser: 1.02 mg/dL (ref 0.61–1.24)
GFR, Estimated: >60 mL/min
Glucose, Bld: 139 mg/dL — ABNORMAL HIGH (ref 70–99)
Potassium: 3.9 mmol/L (ref 3.5–5.1)
Sodium: 135 mmol/L (ref 135–145)

## 2024-07-16 LAB — CBC
HCT: 47.2 % (ref 39.0–52.0)
Hemoglobin: 16.8 g/dL (ref 13.0–17.0)
MCH: 31.5 pg (ref 26.0–34.0)
MCHC: 35.6 g/dL (ref 30.0–36.0)
MCV: 88.6 fL (ref 80.0–100.0)
Platelets: 199 10*3/uL (ref 150–400)
RBC: 5.33 MIL/uL (ref 4.22–5.81)
RDW: 11.6 % (ref 11.5–15.5)
WBC: 18.8 10*3/uL — ABNORMAL HIGH (ref 4.0–10.5)
nRBC: 0 % (ref 0.0–0.2)

## 2024-07-16 LAB — MISC LABCORP TEST (SEND OUT): Labcorp test code: 83935

## 2024-07-16 LAB — GLUCOSE, CAPILLARY
Glucose-Capillary: 193 mg/dL — ABNORMAL HIGH (ref 70–99)
Glucose-Capillary: 209 mg/dL — ABNORMAL HIGH (ref 70–99)
Glucose-Capillary: 135 mg/dL — ABNORMAL HIGH (ref 70–99)

## 2024-07-16 LAB — CBG MONITORING, ED: Glucose-Capillary: 159 mg/dL — ABNORMAL HIGH (ref 70–99)

## 2024-07-16 MED ORDER — SODIUM CHLORIDE 0.9 % IV SOLN: INTRAVENOUS | Status: DC

## 2024-07-16 MED ORDER — ALUM & MAG HYDROXIDE-SIMETH 200-200-20 MG/5ML PO SUSP
30.0000 mL | ORAL | Status: DC | PRN
  Administered 2024-07-16 – 2024-07-17 (×2): 30 mL via ORAL
  Filled 2024-07-16 (×2): qty 30

## 2024-07-16 MED ORDER — SODIUM CHLORIDE 0.9 % IV SOLN: INTRAVENOUS | Status: AC

## 2024-07-16 NOTE — TOC CM/SW Note (Signed)
 RNCM met with patient in his room.  He is alert and does have PCP set up with [facility].  He lives with his mother and denies any issues with that and states he plans to return there once stable.  He is independent and drives, no concerns with obtaining and paying for medications.  No DME in the home, no homecare services presently.  Family member able to transport him home once discharged from Hospital.  Transition of Care [facility]) has reviewed patient and no TOC needs have been identified at this time.  If new patient transition needs arise, please place a TOC consult.

## 2024-07-16 NOTE — Inpatient Diabetes Management (Addendum)
 Inpatient Diabetes Program Recommendations  AACE/ADA: New Consensus Statement on Inpatient Glycemic Control (2015)  Target Ranges:  Prepandial:   less than 140 mg/dL      Peak postprandial:   less than 180 mg/dL (1-2 hours)      Critically ill patients:  140 - 180 mg/dL    Latest Reference Range & Units 07/15/24 00:38  Hemoglobin A1C 4.8 - 5.6 % 12.2 (H)  303 mg/dl  (H): Data is abnormally high  Latest Reference Range & Units 07/15/24 15:30 07/15/24 16:34 07/15/24 17:58 07/15/24 18:43 07/15/24 20:26 07/15/24 22:50  Glucose-Capillary 70 - 99 mg/dL 847 (H)  IV Insulin  Drip 169 (H) 170 (H) 145 (H)  14 units Semglee  @1828  239 (H)  IV Insulin  Drip Stopped 185 (H)  (H): Data is abnormally high  Latest Reference Range & Units 07/16/24 07:57  Glucose-Capillary 70 - 99 mg/dL 840 (H)  2 units Novolog    (H): Data is abnormally high  Admit with: Euglycemic DKA  History: DM2  Home DM Meds:  Jardiance  25 mg daily Metformin  1000 mg BID Ozempic weekly (recently started) Was on Insulin  until 6mos ago and was d/c'd due to Hypoglycemia  Current Orders: Semglee  14 units daily      Novolog  Sensitive Correction Scale/ SSI (0-9 units) TID AC     MD- Note 4am BMET concerning for elevated Anion Gap (17) and low CO2 level (16)  Should we redraw BMET this AM along with new BHB level?  On discharge, recommend D/C Jardiance  and Ozempic and followup with PCP   Have asked OP pharmacy to check and see which basal and quick acting insulins are best covered   Addendum 11:45am--Met w/ pt at bedside.  Pt told me he used to take Tresiba  and Novolog  insulins--Stopped the insulin  per PCP orders b/c of HYPO issues.  Still has some Novolog  pens at home in the fridge.  Discussed with pt that if he is sent home on Novolog , I would like for him to check the expiration date on the Novolog  prior to using.  Pt told me he has CBG meter at home but only checks 2-3 times per week and sees CBGs 150-200 at the  highest.  Discussed admission diagnosis of Euglycemic DKA with pt--What it is, treatment with IVF and IV Insulin , labs, transition to SQ Insulin .  Explained to pt that he has been transitioned to Semglee  and Novolog  SSI--explained what each insulin  is and how each works.  Pt told me he knows how to use insulin  pens and does not need to be re-trained on the insulin  pen and is OK going home on insulin  if needed.  Explained to pt that given he was admitted with DKA, the MD will likely stop his home Jardiance  until he can follow up with PCP in Feb.  Pt told me he has PCP appt sometime early Feb.  Spoke with patient about his current A1c of 12.2%.  Explained what an A1c is and what it measures.  Reminded patient that his goal A1c is 7% or less per ADA standards to prevent both acute and long-term complications.  Explained to patient the extreme importance of good glucose control at home.  Encouraged patient to check his CBGs at least TID AC at home and to record all CBGs in a logbook for his PCP to review.      --Will follow patient during hospitalization--  Adina Rudolpho Arrow RN, MSN, CDCES Diabetes Coordinator Inpatient Glycemic Control Team Team Pager: [phone] (8a-5p)

## 2024-07-16 NOTE — Consult Note (Signed)
 "      Dustin Copping, MD [facility]  [address] [place], [place] [address] Phone: [phone] Fax : [phone]  Consultation  Referring Provider:     Dr. Lanetta Primary Care Physician:  Dustin Williamson LABOR, MD Primary Gastroenterologist: Dustin Williamson         Reason for Consultation:     Dysphagia  Date of Admission:  07/14/2024 Date of Consultation:  07/16/2024         HPI:   Dustin Williamson is a 54 y.o. male who has a history of diabetes, hypertension and heart failure with preserved ejection fraction who came in with nausea vomiting and was found to have DKA.  The patient was recently started on Ozempic and had reported that he is not felt right when taking it and does not want to continue it.  The patient has reported dysphagia mostly to solids and had a CT scan that showed:  IMPRESSION: 1. No CT evidence for acute intra-abdominal or pelvic abnormality. 2. Mild circumferential distal esophageal thickening, question esophagitis or reflux. 3. Small nonobstructing bilateral kidney stones.  The patient reports that he has had heartburn for many years and was given some medication in the ED in the past.  The patient also reports that he has had dysphagia to solids for many months.  There is no report of any unexplained weight loss fevers chills or vomiting.  The patient does state that when he drinks soda he feels like it is bubbling up in his esophagus because it does not go down.  Now being asked to see the patient for dysphagia.  Past Medical History:  Diagnosis Date   Anxiety    Asthma    CHF (congestive heart failure) (HCC)    Depressed    Diabetes mellitus without complication (HCC)    Diastolic heart failure (HCC)    GERD (gastroesophageal reflux disease)    Heartburn    HLD (hyperlipidemia)    Hypertension    Kidney stone    Migraines    Neuropathy    Sleep apnea     Past Surgical History:  Procedure Laterality Date   CYSTOSCOPY W/ URETERAL STENT REMOVAL N/A 05/27/2016    Procedure: CYSTOSCOPY WITH STENT REMOVAL;  Surgeon: Dustin VEAR Cramp, MD;  Location: [facility] ORS;  Service: Urology;  Laterality: N/A;   CYSTOSCOPY WITH STENT PLACEMENT Left 05/05/2016   Procedure: CYSTOSCOPY WITH STENT PLACEMENT;  Surgeon: Dustin Riis, MD;  Location: [facility] ORS;  Service: Urology;  Laterality: Left;   HERNIA REPAIR     KNEE SURGERY Left 2009    Prior to Admission medications  Medication Sig Start Date End Date Taking? Authorizing Provider  aspirin  81 MG EC tablet TAKE 1 TABLET BY MOUTH ONCE DAILY FOR HEART PROTECTION. Patient taking differently: Take 81 mg by mouth daily. 07/19/21  Yes   aspirin -acetaminophen -caffeine (EXCEDRIN MIGRAINE) 250-250-65 MG tablet Take 2 tablets by mouth every 6 (six) hours as needed for headache.   Yes [provider]  atorvastatin  (LIPITOR) 80 MG tablet TAKE ONE TABLET BY MOUTH AT BEDTIME FOR HIGH CHOLESTEROL Patient taking differently: Take 80 mg by mouth daily. 03/16/21 07/14/24 Yes   busPIRone  (BUSPAR ) 15 MG tablet TAKE 1 TABLET BY MOUTH TWICE A DAY FOR MOOD. Patient taking differently: Take 15 mg by mouth 2 (two) times daily. 07/20/21  Yes   chlorhexidine  (PERIDEX ) 0.12 % solution Use as directed 15 mLs in the mouth or throat 2 (two) times daily. 04/15/24  Yes Poggi, Dustin  Williamson, Williamson  DULoxetine  (CYMBALTA ) 30 MG capsule TAKE 3 CAPSULES BY MOUTH ONCE DAILY FOR MOOD AND PAIN. Patient taking differently: Take 90 mg by mouth daily. 07/19/21  Yes   empagliflozin  (JARDIANCE ) 25 MG TABS tablet TAKE 1 TABLET BY MOUTH ONCE DAILY FOR DIABETES. Patient taking differently: Take 25 mg by mouth daily. 07/19/21  Yes   gabapentin  (NEURONTIN ) 400 MG capsule TAKE 3 CAPSULES BY MOUTH EVERY MORNING AND AFTERNOON AND TAKE 4 CAPSULES BY MOUTH AT BEDTIME. Patient taking differently: Take 1,200-1,600 mg by mouth 3 (three) times daily. 07/06/21  Yes   lisinopril  (ZESTRIL ) 2.5 MG tablet Take 2.5 mg by mouth daily. 11/20/22  Yes [provider]  loratadine  (CLARITIN ) 10  MG tablet Take 10 mg by mouth daily. 04/21/24  Yes [provider]  metFORMIN  (GLUCOPHAGE ) 1000 MG tablet TAKE ONE TABLET BY MOUTH 2 TIMES A DAY FOR DIABETES Patient taking differently: Take 1,000 mg by mouth 2 (two) times daily with a meal. 03/16/21 07/14/24 Yes   omeprazole  (PRILOSEC) 20 MG capsule TAKE 1 CAPSULE BY MOUTH ONCE DAILY FOR ACID REFLUX. Patient taking differently: Take 20 mg by mouth daily. 07/19/21  Yes   propranolol  (INDERAL ) 80 MG tablet Take 80 mg by mouth 2 (two) times daily. 11/20/22  Yes [provider]  QUEtiapine  (SEROQUEL ) 25 MG tablet TAKE ONE TABLET BY MOUTH ONCE NIGHTLY. Patient taking differently: Take 50 mg by mouth at bedtime. 05/05/21  Yes   tamsulosin  (FLOMAX ) 0.4 MG CAPS capsule Take 1 capsule (0.4 mg total) by mouth daily. 05/16/23  Yes Fisher, Dustin Williamson  tiZANidine  (ZANAFLEX ) 4 MG tablet TAKE 1 TABLET BY MOUTH 4 TIMES DAILY AS NEEDED FOR MUSCLE SPASTICITY. 07/19/21  Yes   glucose blood (RIGHTEST GS550 BLOOD GLUCOSE) test strip Use as directed 02/07/21   Dustin Williamson, [facility]  Insulin  Pen Needle (NOVOFINE PEN NEEDLE) 32G X 6 MM MISC USE AS DIRECTED 03/15/21   Dustin Williamson, [facility]  Rightest GL300 Lancets MISC Use as directed 02/07/21   Dustin Williamson, [facility]    Family History  Problem Relation Age of Onset   Hypertension Mother    High blood pressure Mother    Diabetes Mellitus II Father    Cancer Father    Multiple myeloma Father    Hypertension Brother    Dementia Paternal Aunt    Prostate cancer Neg Hx    Kidney disease Neg Hx    Bladder Cancer Neg Hx      Social History[1]  Allergies as of 07/14/2024   (No Known Allergies)    Review of Systems:    All systems reviewed and negative except where noted in HPI.   Physical Exam:  Vital signs in last 24 hours: Temp:  [97.6 F (36.4 C)-98.7 F (37.1 C)] 97.8 F (36.6 C) (01/28 0839) Pulse Rate:  [68-82] 73 (01/28 0839) Resp:  [12-25] 15 (01/28 0839) BP: (117-146)/(90-107) 128/92  (01/28 0902) SpO2:  [92 %-99 %] 99 % (01/28 0839) Last BM Date : 07/14/24 General:   Pleasant, cooperative in NAD Head:  Normocephalic and atraumatic. Eyes:   No icterus.   Conjunctiva pink. PERRLA. Ears:  Normal auditory acuity. Neck:  Supple; no masses or thyroidomegaly Lungs: Respirations even and unlabored. Lungs clear to auscultation bilaterally.   No wheezes, crackles, or rhonchi.  Heart:  Regular rate and rhythm;  Without murmur, clicks, rubs or gallops Abdomen:  Soft, nondistended, nontender. Normal bowel sounds. No appreciable masses or hepatomegaly.  No rebound  or guarding.  Rectal:  Not performed. Msk:  Symmetrical without gross deformities.  Extremities:  Without edema, cyanosis or clubbing. Neurologic:  Alert and oriented x3;  grossly normal neurologically. Skin:  Intact without significant lesions or rashes. Cervical Nodes:  No significant cervical adenopathy. Psych:  Alert and cooperative. Normal affect.  LAB RESULTS: Recent Labs    07/14/24 2008 07/15/24 0320 07/16/24 0417  WBC 21.3* 19.5* 18.8*  HGB 18.8* 17.3* 16.8  HCT 53.8* 48.9 47.2  PLT 255 275 199   BMET Recent Labs    07/15/24 1139 07/15/24 1656 07/16/24 0417  NA 134* 134* 135  K 3.9 3.7 3.9  CL 101 102 102  CO2 18* 19* 16*  GLUCOSE 136* 159* 139*  BUN 20 19 13   CREATININE 1.12 1.13 1.02  CALCIUM  9.4 8.5* 7.9*   LFT Recent Labs    07/14/24 1422  PROT 8.8*  ALBUMIN 5.0  AST 34  ALT 32  ALKPHOS 133*  BILITOT 0.6   PT/INR No results for input(s): LABPROT, INR in the last 72 hours.  STUDIES: CT ABDOMEN PELVIS W CONTRAST Result Date: 07/14/2024 CLINICAL DATA:  Abdominal pain nausea vomiting EXAM: CT ABDOMEN AND PELVIS WITH CONTRAST TECHNIQUE: Multidetector CT imaging of the abdomen and pelvis was performed using the standard protocol following bolus administration of intravenous contrast. RADIATION DOSE REDUCTION: This exam was performed according to the departmental  dose-optimization program which includes automated exposure control, adjustment of the mA and/or kV according to patient size and/or use of iterative reconstruction technique. CONTRAST:  OMNIPAQUE  IOHEXOL  300 MG/ML  SOLN COMPARISON:  CT 05/16/2023, 11/30/2022 FINDINGS: Lower chest: Lung bases are clear. Mild circumferential distal esophageal thickening. Hepatobiliary: No focal liver abnormality is seen. No gallstones, gallbladder wall thickening, or biliary dilatation. Pancreas: Unremarkable. No pancreatic ductal dilatation or surrounding inflammatory changes. Spleen: Normal in size without focal abnormality. Adrenals/Urinary Tract: Adrenal glands are normal. No hydronephrosis. Small nonobstructing bilateral kidney stones. The bladder is unremarkable Stomach/Bowel: Stomach nonenlarged. No dilated small bowel. Decompressed appearance of colon. Negative appendix. Vascular/Lymphatic: No significant vascular findings are present. No enlarged abdominal or pelvic lymph nodes. Reproductive: Prostate is unremarkable. Other: No ascites or free air Musculoskeletal: No acute or suspicious osseous abnormality IMPRESSION: 1. No CT evidence for acute intra-abdominal or pelvic abnormality. 2. Mild circumferential distal esophageal thickening, question esophagitis or reflux. 3. Small nonobstructing bilateral kidney stones. Electronically Signed   By: Luke Bun M.D.   On: 07/14/2024 17:38      Impression / Plan:   Assessment: Principal Problem:   DKA (diabetic ketoacidosis) (HCC)   RAAHIL ONG is a 54 y.o. y/o male with dysphagia to solids.  The patient states that has been going on for many months.  The patient is in for DKA and had a CT scan showing mild circumferential distal esophageal thickening.  Plan:  The patient will be set up for an EGD for tomorrow.  The patient will be kept n.p.o. after 5 AM.  The patient has been explained the procedure including risk benefits and alternatives and agrees to  proceeding with the upper endoscopy.  Thank you for involving me in the care of this patient.      LOS: 2 days   Dustin Copping, MD, MD. NOLIA 07/16/2024, 12:12 PM,  Pager [phone] 7am-5pm  Check AMION for 5pm -7am coverage and on weekends   Note: This dictation was prepared with Dragon dictation along with smaller phrase technology. Any transcriptional errors that result from this process  are unintentional.       [1]  Social History Tobacco Use   Smoking status: Former    Current packs/day: 0.00    Average packs/day: 0.5 packs/day for 10.0 years (5.0 ttl pk-yrs)    Types: Cigarettes    Start date: 06/19/1990    Quit date: 06/19/2000    Years since quitting: 24.0   Smokeless tobacco: Never   Tobacco comments:    Hasn't smoked in 15 yrs or so   Vaping Use   Vaping status: Former  Substance Use Topics   Alcohol use: No    Comment: been 3-4 yrs since last drank    Drug use: Yes    Types: Marijuana    Comment: Smokes once daily.   "

## 2024-07-16 NOTE — Telephone Encounter (Signed)
"   Patient Product/process Development Scientist completed.    The patient is insured through [employer]. Patient has Medicare and is not eligible for a copay card, but may be able to apply for patient assistance or Medicare RX Payment Plan (Patient Must reach out to their plan, if eligible for payment plan), if available.    Ran test claim for Lantus  100unit pen and the current 30 day co-pay is $12.65.  Ran test claim for Novolog  100unit pen and the current 30 day co-pay is $12.65.  This test claim was processed through [facility]- copay amounts may vary at other pharmacies due to pharmacy/plan contracts, or as the patient moves through the different stages of their insurance plan.  "

## 2024-07-16 NOTE — ED Notes (Signed)
 Pt is concerned about one of their teeth on the top left side of their mouth, stating that the root is coming through the enamel. Pt states that since they got clean in 2017 their health has gone downhill and their teeth are rotting. Pt states that they have already lost several teeth. When asked if they have been seeing a dentist for this, pt states that they were but they've been sick recently. Pt is hoping that they can go ahead and get all their teeth pulled so that they can get their dentures. Pt denies any needs at this time.

## 2024-07-16 NOTE — Progress Notes (Signed)
 Per Dr Lanetta, dc tele monitoring order

## 2024-07-16 NOTE — Plan of Care (Signed)
" °  Problem: Education: Goal: Ability to describe self-care measures that may prevent or decrease complications (Diabetes Survival Skills Education) will improve Outcome: Progressing   Problem: Nutritional: Goal: Maintenance of adequate nutrition will improve Outcome: Progressing   Problem: Skin Integrity: Goal: Risk for impaired skin integrity will decrease Outcome: Progressing   Problem: Tissue Perfusion: Goal: Adequacy of tissue perfusion will improve Outcome: Progressing   Problem: Cardiac: Goal: Ability to maintain an adequate cardiac output will improve Outcome: Progressing   Problem: Nutritional: Goal: Maintenance of adequate nutrition will improve Outcome: Progressing   Problem: Pain Managment: Goal: General experience of comfort will improve and/or be controlled Outcome: Progressing   Problem: Safety: Goal: Ability to remain free from injury will improve Outcome: Progressing   "

## 2024-07-16 NOTE — Progress Notes (Signed)
 " Progress Note   Patient: Dustin Williamson FMW:[id number] DOB: July 22, 1970 DOA: 07/14/2024     2 DOS: the patient was seen and examined on 07/16/2024   Brief hospital course: PARAS KREIDER is a 54 y.o. male with medical history significant of T2DM, HTN, HFpEF admitted due to intractable nausea, vomiting since Saturday.  Was recently started on Ozempic, states he has not felt right and does not want to take it. Workup revealed DKA, admitted for further management.  Hospital course as below    Assessment and Plan:  Euglycemic DKA  Uncontrolled T2DM HbA1c 12.2 Has been noncompliant with Ozempic due to side effects. Continue Lantus  14 units daily. Continue sliding scale insulin  Continue consistent diet  Hold metformin  and Jardiance      Hypokalemia Hypophosphatemia Supplemented     HTN Elevated troponin Blood pressure stable on propranolol  Elevated troponin appears to be secondary to demand ischemia and is flat.    Leukocytosis Leukemoid reaction Suspect reactive, no symptoms/signs of infection UA neg for UTI, CT abd/pelvis negative for acute abnormality Shows a downward trend    AKI  - Resolved Baseline serum creatinine of 1.15 and on admission it was 1.64 Likely pre-renal, S/p IV fluids Back to baseline    HFpEF Last known LVEF of 60 to 65% from a 2D echocardiogram which was done 06/24 Stable and not acutely exacerbated   Abnormal TSH Hyperthyrodism ruled out TSH 0.212,  FT4 is within normal limits   GERD PPI   Obesity class I Body mass index is 32.28 kg/m. Outpatient follow up for lifestyle modification and risk factor management   Incidental findings Small nonobstructing bilateral kidney stones    Dysphagia Mostly for solids Will consult GI May benefit from upper endoscopy as an outpatient for further evaluation     Subjective: Complains of dysphagia for solids  Physical Exam: Vitals:   07/16/24 0600 07/16/24 0700 07/16/24 0839 07/16/24 0902   BP: (!) 117/90 (!) 143/93 (!) 128/92 (!) 128/92  Pulse: 71 68 73   Resp: 17 14 15    Temp: 98.1 F (36.7 C)  97.8 F (36.6 C)   TempSrc: Oral     SpO2: 96% 97% 99%   Weight:      Height:       Constitutional:      General: He is not in acute distress, obese Cardiovascular:     Rate and Rhythm: RRR Pulmonary:     Effort: Pulmonary effort is normal. No respiratory distress.     Breath sounds: Normal breath sounds.  Abdominal:     General: There is no distension.     Palpations: Abdomen is soft.     Tenderness: There is no abdominal tenderness.  Musculoskeletal:     Cervical back: Normal range of motion and neck supple.  Neurological:     General: No focal deficit present.     Mental Status: He is alert.  Psychiatric:        Mood and Affect: Mood normal.        Behavior: Behavior normal   Data Reviewed: Labs reviewed.  Bicarb 16, glucose 139, anion gap 17, white count 18.8 Labs reviewed  Family Communication: Plan of care discussed with patient at the bedside.  For possible discharge in a.m.  Disposition: Status is: Inpatient Remains inpatient appropriate because: Optimize glycemic control, evaluation for dysphagia  Planned Discharge Destination: Home    Time spent:  40 minutes  Author: Aimee Somerset, MD 07/16/2024 11:52 AM  For on call  review [email or web address].  "

## 2024-07-17 ENCOUNTER — Encounter: Payer: Self-pay | Admitting: Emergency Medicine

## 2024-07-17 ENCOUNTER — Encounter: Admission: EM | Disposition: A | Payer: Self-pay | Source: Home / Self Care | Attending: Internal Medicine

## 2024-07-17 ENCOUNTER — Inpatient Hospital Stay: Admitting: Anesthesiology

## 2024-07-17 ENCOUNTER — Other Ambulatory Visit: Payer: Self-pay

## 2024-07-17 DIAGNOSIS — K2100 Gastro-esophageal reflux disease with esophagitis, without bleeding: Secondary | ICD-10-CM

## 2024-07-17 DIAGNOSIS — E669 Obesity, unspecified: Secondary | ICD-10-CM | POA: Diagnosis present

## 2024-07-17 DIAGNOSIS — E1110 Type 2 diabetes mellitus with ketoacidosis without coma: Secondary | ICD-10-CM | POA: Diagnosis not present

## 2024-07-17 DIAGNOSIS — K2981 Duodenitis with bleeding: Principal | ICD-10-CM

## 2024-07-17 DIAGNOSIS — K2980 Duodenitis without bleeding: Secondary | ICD-10-CM

## 2024-07-17 LAB — BASIC METABOLIC PANEL WITH GFR
Anion gap: 18 — ABNORMAL HIGH (ref 5–15)
BUN: 13 mg/dL (ref 6–20)
CO2: 17 mmol/L — ABNORMAL LOW (ref 22–32)
Calcium: 8.6 mg/dL — ABNORMAL LOW (ref 8.9–10.3)
Chloride: 101 mmol/L (ref 98–111)
Creatinine, Ser: 0.96 mg/dL (ref 0.61–1.24)
GFR, Estimated: >60 mL/min
Glucose, Bld: 129 mg/dL — ABNORMAL HIGH (ref 70–99)
Potassium: 3.8 mmol/L (ref 3.5–5.1)
Sodium: 136 mmol/L (ref 135–145)

## 2024-07-17 LAB — GLUCOSE, CAPILLARY
Glucose-Capillary: 125 mg/dL — ABNORMAL HIGH (ref 70–99)
Glucose-Capillary: 133 mg/dL — ABNORMAL HIGH (ref 70–99)
Glucose-Capillary: 174 mg/dL — ABNORMAL HIGH (ref 70–99)

## 2024-07-17 MED ORDER — PEN NEEDLES 32G X 4 MM MISC
1.0000 | Freq: Three times a day (TID) | 1 refills | Status: AC
  Filled 2024-07-17: qty 100, 33d supply, fill #0

## 2024-07-17 MED ORDER — POLYETHYLENE GLYCOL 3350 17 G PO PACK
17.0000 g | PACK | Freq: Every day | ORAL | Status: DC
  Administered 2024-07-17: 17 g via ORAL
  Filled 2024-07-17: qty 1

## 2024-07-17 MED ORDER — ENOXAPARIN SODIUM 60 MG/0.6ML IJ SOSY
0.5000 mg/kg | PREFILLED_SYRINGE | Freq: Every day | INTRAMUSCULAR | Status: DC
  Filled 2024-07-17: qty 0.6

## 2024-07-17 MED ORDER — INSULIN GLARGINE 100 UNIT/ML SOLOSTAR PEN
14.0000 [IU] | PEN_INJECTOR | Freq: Every day | SUBCUTANEOUS | 0 refills | Status: AC
  Filled 2024-07-17: qty 6, 42d supply, fill #0

## 2024-07-17 MED ORDER — LIDOCAINE HCL (CARDIAC) PF 100 MG/5ML IV SOSY
PREFILLED_SYRINGE | INTRAVENOUS | Status: DC | PRN
  Administered 2024-07-17: 110 mg via INTRAVENOUS

## 2024-07-17 MED ORDER — PROPOFOL 10 MG/ML IV BOLUS
INTRAVENOUS | Status: DC | PRN
  Administered 2024-07-17: 100 mg via INTRAVENOUS

## 2024-07-17 MED ORDER — NOVOLOG FLEXPEN 100 UNIT/ML ~~LOC~~ SOPN
0.0000 [IU] | PEN_INJECTOR | Freq: Three times a day (TID) | SUBCUTANEOUS | 11 refills | Status: AC
  Filled 2024-07-17: qty 15, 30d supply, fill #0

## 2024-07-17 MED ORDER — PROPOFOL 500 MG/50ML IV EMUL
INTRAVENOUS | Status: DC | PRN
  Administered 2024-07-17: 140 ug/kg/min via INTRAVENOUS

## 2024-07-17 MED ORDER — PANTOPRAZOLE SODIUM 40 MG PO TBEC
40.0000 mg | DELAYED_RELEASE_TABLET | Freq: Every day | ORAL | 11 refills | Status: AC
  Filled 2024-07-17: qty 30, 30d supply, fill #0

## 2024-07-17 NOTE — Progress Notes (Signed)
 The patient had an EGD today with severe esophagitis which could be a cause of his dysphagia.  The patient should be treated with a PPI and follow-up as an outpatient when the esophagus had time to heal and will need a repeat EGD set up in the future.  We usually like to wait 6 weeks for this to heal and then repeat the EGD.  Nothing further to do from GI point of view.  I will sign off.  Please call if any further GI concerns or questions.  We would like to thank you for the opportunity to participate in the care of Dustin Williamson.

## 2024-07-17 NOTE — Op Note (Signed)
 [facility] Gastroenterology Patient Name: Dustin Williamson Procedure Date: 07/17/2024 2:42 PM MRN: [id number] Account #: [id number] Date of Birth: [birth year] Admit Type: Inpatient Age: 54 Room: [facility] ENDO ROOM 3 Gender: Male Note Status: Finalized Instrument Name: Endoscope [id number] Procedure:             Upper GI endoscopy Indications:           Dysphagia Providers:             [name] MD, MD Referring MD:          Maxie LABOR. Aycock MD (Referring MD) Medicines:             Propofol  per Anesthesia Complications:         No immediate complications. Procedure:             Pre-Anesthesia Assessment:                        - Prior to the procedure, a History and Physical was                         performed, and patient medications and allergies were                         reviewed. The patient's tolerance of previous                         anesthesia was also reviewed. The risks and benefits                         of the procedure and the sedation options and risks                         were discussed with the patient. All questions were                         answered, and informed consent was obtained. Prior                         Anticoagulants: The patient has taken no anticoagulant                         or antiplatelet agents. ASA Grade Assessment: II - A                         patient with mild systemic disease. After reviewing                         the risks and benefits, the patient was deemed in                         satisfactory condition to undergo the procedure.                        After obtaining informed consent, the endoscope was                         passed under direct vision. Throughout the procedure,  the patient's blood pressure, pulse, and oxygen                         saturations were monitored continuously. The Endoscope                         was introduced through the mouth, and advanced to the                          second part of duodenum. The upper GI endoscopy was                         accomplished without difficulty. The patient tolerated                         the procedure well. Findings:      LA Grade D (one or more mucosal breaks involving at least 75% of       esophageal circumference) esophagitis with no bleeding was found in the       lower third of the esophagus.      The stomach was normal.      Diffuse moderate inflammation with hemorrhage characterized by erythema,       friability and granularity was found in the duodenal bulb and in the       first portion of the duodenum. Impression:            - LA Grade D reflux esophagitis with no bleeding.                        - Normal stomach.                        - Duodenitis with hemorrhage.                        - No specimens collected. Recommendation:        - Return patient to hospital ward for ongoing care.                        - Resume regular diet.                        - Continue present medications.                        - Use a proton pump inhibitor PO daily. Procedure Code(s):     --- Professional ---                        [id number], Esophagogastroduodenoscopy, flexible,                         transoral; diagnostic, including collection of                         specimen(s) by brushing or washing, when performed                         (separate procedure) Diagnosis Code(s):     --- Professional ---  R13.10, Dysphagia, unspecified                        K29.81, Duodenitis with bleeding                        K21.00, Gastro-esophageal reflux disease with                         esophagitis, without bleeding CPT copyright 2022 American Medical Association. All rights reserved. The codes documented in this report are preliminary and upon coder review may  be revised to meet current compliance requirements. [name] MD, MD 07/17/2024 3:03:26 PM This report has been signed  electronically. Number of Addenda: 0 Note Initiated On: 07/17/2024 2:42 PM Estimated Blood Loss:  Estimated blood loss: none. Estimated blood loss: none.      [facility]

## 2024-07-17 NOTE — Discharge Summary (Signed)
 " Physician Discharge Summary   Patient: Dustin Williamson MRN: [id number] DOB: [birth year]  Admit date:     07/14/2024  Discharge date: 07/17/24  Discharge Physician: Tashonda Pinkus   PCP: Lorel Maxie LABOR, MD   Recommendations at discharge:   Check blood sugars daily Take PPI as recommended Follow-up with GI in 6 weeks Take insulin  as recommended   Discharge Diagnoses: Principal Problem:   DKA (diabetic ketoacidosis) (HCC) Active Problems:   Gastroesophageal reflux disease with esophagitis without hemorrhage   AKI (acute kidney injury)   Depression   Chronic diastolic CHF (congestive heart failure) (HCC)   Esophageal dysphagia   Duodenitis   Obesity (BMI 30-39.9)  Resolved Problems:   * No resolved hospital problems. *  Hospital Course:  Dustin Williamson is a 54 y.o. male with medical history significant of T2DM, HTN, HFpEF    Presents emergency department for evaluation of intractable nausea, vomiting since Saturday.  He denies hematemesis, fevers, abdominal pain, diarrhea, sick contacts. He was recently started on Ozempic and states he has not felt right since he started this and no longer wants to take it. Takes Jardiance .  Review of systems positive for intermittent chest pain, constipation, palpitations, shortness of breath, dizziness with standing, cough.    In the ED he was afebrile, and persistent sinus tachycardia up to 140, blood pressure slightly elevated, saturating appropriately on room air. On Chemistry, CO2 undetectably low,anion gap incalculable. Lactic acid reassuring. Glucose 237.  Cr 147,  alk phos 133. WBC 18.7. ABG with metabolic acidosis with pH 7.08. CT abdomen/pelvis reassuring.    Started on insulin  drip and admission requested for further management euglycemic DKA.      Assessment and Plan:  Euglycemic DKA  Uncontrolled T2DM HbA1c 12.2 Has been noncompliant with Ozempic due to side effects. Improve glycemic control Continue Lantus  14 units  daily. Continue sliding scale insulin  Continue consistent diet  Jardiance  has been discontinued and patient may continue metformin      Dysphagia Esophagitis Mostly for solids Appreciate GI input Patient is status post upper endoscopy which showed evidence of esophagitis with no bleeding. Noted to have duodenitis. Will discharge patient on PPI  Follow-up with GI as an outpatient      Hypokalemia Hypophosphatemia Supplemented       HTN Elevated troponin Blood pressure is stable on propranolol  and lisinopril  Elevated troponin appears to be secondary to demand ischemia and is flat.  Not concerning for an acute coronary syndrome     Leukocytosis Leukemoid reaction Suspect reactive, no symptoms/signs of infection UA neg for UTI, CT abd/pelvis negative for acute abnormality Shows a downward trend     AKI  - Resolved Baseline serum creatinine of 1.15 and on admission it was 1.64 Likely pre-renal, S/p IV fluids Back to baseline     HFpEF Last known LVEF of 60 to 65% from a 2D echocardiogram which was done 06/24 Stable and not acutely exacerbated Continue lisinopril    Abnormal TSH Hyperthyrodism ruled out TSH 0.212,  FT4 is within normal limits   GERD PPI   Obesity class I Body mass index is 32.28 kg/m. Outpatient follow up for lifestyle modification and risk factor management   Incidental findings Small nonobstructing bilateral kidney stones                 Consultants: Gastroenterology  Procedures performed: Upper endoscopy  Disposition: Home Diet recommendation:  Discharge Diet Orders (From admission, onward)     Start     Ordered  07/17/24 0000  Diet Carb Modified        07/17/24 1557   07/17/24 0000  Diet - low sodium heart healthy        07/17/24 1557           Cardiac and Carb modified diet DISCHARGE MEDICATION: Allergies as of 07/17/2024   No Known Allergies      Medication List     STOP taking these medications     Jardiance  25 MG Tabs tablet Generic drug: empagliflozin    omeprazole  20 MG capsule Commonly known as: PRILOSEC       TAKE these medications    aspirin  EC 81 MG tablet TAKE 1 TABLET BY MOUTH ONCE DAILY FOR HEART PROTECTION. What changed:  how much to take when to take this   aspirin -acetaminophen -caffeine 250-250-65 MG tablet Commonly known as: EXCEDRIN MIGRAINE Take 2 tablets by mouth every 6 (six) hours as needed for headache.   atorvastatin  80 MG tablet Commonly known as: LIPITOR TAKE ONE TABLET BY MOUTH AT BEDTIME FOR HIGH CHOLESTEROL What changed:  how much to take when to take this   busPIRone  15 MG tablet Commonly known as: BUSPAR  TAKE 1 TABLET BY MOUTH TWICE A DAY FOR MOOD (TAKE 1 TABLET BY MOUTH TWICE A DAY FOR MOOD.) What changed:  how much to take how to take this when to take this   chlorhexidine  0.12 % solution Commonly known as: PERIDEX  Use as directed 15 mLs in the mouth or throat 2 (two) times daily.   Cymbalta  30 MG capsule Generic drug: DULoxetine  TAKE 3 CAPSULES BY MOUTH ONCE DAILY FOR MOOD AND PAIN. What changed:  how much to take when to take this   gabapentin  400 MG capsule Commonly known as: NEURONTIN  TAKE 3 CAPSULES BY MOUTH EVERY MORNING AND AFTERNOON AND TAKE 4 CAPSULES BY MOUTH AT BEDTIME. What changed:  how much to take when to take this   insulin  aspart 100 UNIT/ML injection Commonly known as: novoLOG  Inject 0-9 Units into the skin 3 (three) times daily with meals.   insulin  glargine 100 UNIT/ML Solostar Pen Commonly known as: LANTUS  Inject 14 Units into the skin daily.   lisinopril  2.5 MG tablet Commonly known as: ZESTRIL  Take 2.5 mg by mouth daily.   loratadine  10 MG tablet Commonly known as: CLARITIN  Take 10 mg by mouth daily.   metFORMIN  1000 MG tablet Commonly known as: GLUCOPHAGE  TAKE ONE TABLET BY MOUTH 2 TIMES A DAY FOR DIABETES What changed:  how much to take when to take this   Novofine Pen Needle  32G X 6 MM Misc Generic drug: Insulin  Pen Needle USE AS DIRECTED What changed: Another medication with the same name was added. Make sure you understand how and when to take each.   Pen Needles 32G X 4 MM Misc Use 3 (three) times daily with insulin  as directed. What changed: You were already taking a medication with the same name, and this prescription was added. Make sure you understand how and when to take each.   pantoprazole  40 MG tablet Commonly known as: Protonix  Take 1 tablet (40 mg total) by mouth daily.   propranolol  80 MG tablet Commonly known as: INDERAL  Take 80 mg by mouth 2 (two) times daily.   QUEtiapine  25 MG tablet Commonly known as: SEROQUEL  TAKE ONE TABLET BY MOUTH ONCE NIGHTLY. What changed: how much to take   Rightest GL300 Lancets Misc Use as directed   Rightest GS550 Blood Glucose test strip Generic drug: glucose  blood Use as directed   tamsulosin  0.4 MG Caps capsule Commonly known as: FLOMAX  Take 1 capsule (0.4 mg total) by mouth daily.   tiZANidine  4 MG tablet Commonly known as: ZANAFLEX  TAKE 1 TABLET BY MOUTH 4 TIMES DAILY AS NEEDED FOR MUSCLE SPASTICITY.        Follow-up Information     Aycock, Ngwe A, MD Follow up.   Specialty: Family Medicine Why: hospital follow up Contact information: [address] [place] [address] [phone]                Discharge Exam: Filed Weights   07/14/24 1403  Weight: 90.7 kg   Constitutional:      General: He is not in acute distress, obese Cardiovascular:     Rate and Rhythm: RRR Pulmonary:     Effort: Pulmonary effort is normal. No respiratory distress.     Breath sounds: Normal breath sounds.  Abdominal:     General: There is no distension.     Palpations: Abdomen is soft.     Tenderness: There is no abdominal tenderness.  Musculoskeletal:     Cervical back: Normal range of motion and neck supple.  Neurological:     General: No focal deficit present.     Mental  Status: He is alert.  Psychiatric:        Mood and Affect: Mood normal.        Behavior: Behavior normal    Condition at discharge: stable  The results of significant diagnostics from this hospitalization (including imaging, microbiology, ancillary and laboratory) are listed below for reference.   Imaging Studies: CT ABDOMEN PELVIS W CONTRAST Result Date: 07/14/2024 CLINICAL DATA:  Abdominal pain nausea vomiting EXAM: CT ABDOMEN AND PELVIS WITH CONTRAST TECHNIQUE: Multidetector CT imaging of the abdomen and pelvis was performed using the standard protocol following bolus administration of intravenous contrast. RADIATION DOSE REDUCTION: This exam was performed according to the departmental dose-optimization program which includes automated exposure control, adjustment of the mA and/or kV according to patient size and/or use of iterative reconstruction technique. CONTRAST:  OMNIPAQUE  IOHEXOL  300 MG/ML  SOLN COMPARISON:  CT 05/16/2023, 11/30/2022 FINDINGS: Lower chest: Lung bases are clear. Mild circumferential distal esophageal thickening. Hepatobiliary: No focal liver abnormality is seen. No gallstones, gallbladder wall thickening, or biliary dilatation. Pancreas: Unremarkable. No pancreatic ductal dilatation or surrounding inflammatory changes. Spleen: Normal in size without focal abnormality. Adrenals/Urinary Tract: Adrenal glands are normal. No hydronephrosis. Small nonobstructing bilateral kidney stones. The bladder is unremarkable Stomach/Bowel: Stomach nonenlarged. No dilated small bowel. Decompressed appearance of colon. Negative appendix. Vascular/Lymphatic: No significant vascular findings are present. No enlarged abdominal or pelvic lymph nodes. Reproductive: Prostate is unremarkable. Other: No ascites or free air Musculoskeletal: No acute or suspicious osseous abnormality IMPRESSION: 1. No CT evidence for acute intra-abdominal or pelvic abnormality. 2. Mild circumferential distal  esophageal thickening, question esophagitis or reflux. 3. Small nonobstructing bilateral kidney stones. Electronically Signed   By: Luke Bun M.D.   On: 07/14/2024 17:38    Microbiology: Results for orders placed or performed during the hospital encounter of 12/01/22  Resp panel by RT-PCR (RSV, Flu A&B, Covid) Anterior Nasal Swab     Status: None   Collection Time: 12/01/22  9:56 AM   Specimen: Anterior Nasal Swab  Result Value Ref Range Status   SARS Coronavirus 2 by RT PCR NEGATIVE NEGATIVE Final    Comment: (NOTE) SARS-CoV-2 target nucleic acids are NOT DETECTED.  The SARS-CoV-2 RNA is  generally detectable in upper respiratory specimens during the acute phase of infection. The lowest concentration of SARS-CoV-2 viral copies this assay can detect is 138 copies/mL. A negative result does not preclude SARS-Cov-2 infection and should not be used as the sole basis for treatment or other patient management decisions. A negative result may occur with  improper specimen collection/handling, submission of specimen other than nasopharyngeal swab, presence of viral mutation(s) within the areas targeted by this assay, and inadequate number of viral copies(<138 copies/mL). A negative result must be combined with clinical observations, patient history, and epidemiological information. The expected result is Negative.  Fact Sheet for Patients:  bloggercourse.com  Fact Sheet for Healthcare Providers:  seriousbroker.it  This test is no t yet approved or cleared by the [place]  FDA and  has been authorized for detection and/or diagnosis of SARS-CoV-2 by FDA under an Emergency Use Authorization (EUA). This EUA will remain  in effect (meaning this test can be used) for the duration of the COVID-19 declaration under Section 564(b)(1) of the Act, 21 U.S.C.section 360bbb-3(b)(1), unless the authorization is terminated  or revoked sooner.        Influenza A by PCR NEGATIVE NEGATIVE Final   Influenza B by PCR NEGATIVE NEGATIVE Final    Comment: (NOTE) The Xpert Xpress SARS-CoV-2/FLU/RSV plus assay is intended as an aid in the diagnosis of influenza from Nasopharyngeal swab specimens and should not be used as a sole basis for treatment. Nasal washings and aspirates are unacceptable for Xpert Xpress SARS-CoV-2/FLU/RSV testing.  Fact Sheet for Patients: bloggercourse.com  Fact Sheet for Healthcare Providers: seriousbroker.it  This test is not yet approved or cleared by the [place]  FDA and has been authorized for detection and/or diagnosis of SARS-CoV-2 by FDA under an Emergency Use Authorization (EUA). This EUA will remain in effect (meaning this test can be used) for the duration of the COVID-19 declaration under Section 564(b)(1) of the Act, 21 U.S.C. section 360bbb-3(b)(1), unless the authorization is terminated or revoked.     Resp Syncytial Virus by PCR NEGATIVE NEGATIVE Final    Comment: (NOTE) Fact Sheet for Patients: bloggercourse.com  Fact Sheet for Healthcare Providers: seriousbroker.it  This test is not yet approved or cleared by the [place]  FDA and has been authorized for detection and/or diagnosis of SARS-CoV-2 by FDA under an Emergency Use Authorization (EUA). This EUA will remain in effect (meaning this test can be used) for the duration of the COVID-19 declaration under Section 564(b)(1) of the Act, 21 U.S.C. section 360bbb-3(b)(1), unless the authorization is terminated or revoked.  Performed at [facility], [address], [place], [place] [address]   Blood Culture (routine x 2)     Status: None   Collection Time: 12/01/22  9:57 AM   Specimen: BLOOD  Result Value Ref Range Status   Specimen Description   Final    BLOOD BLOOD LEFT HAND Performed at [facility], [address], [place], [place] [address]    Special Requests   Final    BOTTLES DRAWN AEROBIC AND ANAEROBIC Blood Culture results may not be optimal due to an inadequate volume of blood received in culture bottles Performed at [facility], [address], [place], [place] [address]    Culture   Final    NO GROWTH 5 DAYS Performed at [facility], [address], [place], [place] [address]    Report Status 12/06/2022 FINAL  Final  Blood Culture (routine x 2)  Status: None   Collection Time: 12/01/22  9:58 AM   Specimen: BLOOD  Result Value Ref Range Status   Specimen Description   Final    BLOOD RIGHT ANTECUBITAL Performed at [facility], [address] [place], [place] [address]    Special Requests   Final    BOTTLES DRAWN AEROBIC AND ANAEROBIC Blood Culture adequate volume Performed at [facility], [address] [place], [place] [address]    Culture   Final    NO GROWTH 5 DAYS Performed at [facility], [address], [place], [place] [address]    Report Status 12/06/2022 FINAL  Final  Aerobic Culture w Gram Stain (superficial specimen)     Status: None   Collection Time: 12/01/22  1:46 PM   Specimen: Wound  Result Value Ref Range Status   Specimen Description   Final    WOUND Performed at [facility], [address], [place], [place] [address]    Special Requests   Final    TOE Performed at [facility], [address] [place], [place] [address]    Gram Stain ABUNDANT GRAM POSITIVE COCCI NO WBC SEEN   Final   Culture   Final    ABUNDANT STAPHYLOCOCCUS AUREUS MODERATE STREPTOCOCCUS AGALACTIAE TESTING AGAINST S. AGALACTIAE NOT ROUTINELY PERFORMED DUE TO PREDICTABILITY OF AMP/PEN/VAN SUSCEPTIBILITY. Performed at [facility], [address], [place], [place] [address]    Report Status 12/04/2022 FINAL  Final   Organism ID, Bacteria STAPHYLOCOCCUS AUREUS  Final      Susceptibility    Staphylococcus aureus - MIC*    CIPROFLOXACIN <=0.5 SENSITIVE Sensitive     ERYTHROMYCIN <=0.25 SENSITIVE Sensitive     GENTAMICIN <=0.5 SENSITIVE Sensitive     OXACILLIN 0.5 SENSITIVE Sensitive     TETRACYCLINE <=1 SENSITIVE Sensitive     VANCOMYCIN  1 SENSITIVE Sensitive     TRIMETH/SULFA <=10 SENSITIVE Sensitive     CLINDAMYCIN <=0.25 SENSITIVE Sensitive     RIFAMPIN <=0.5 SENSITIVE Sensitive     Inducible Clindamycin NEGATIVE Sensitive     LINEZOLID 2 SENSITIVE Sensitive     * ABUNDANT STAPHYLOCOCCUS AUREUS    Labs: CBC: Recent Labs  Lab 07/14/24 1422 07/14/24 2008 07/15/24 0320 07/16/24 0417  WBC 18.7* 21.3* 19.5* 18.8*  HGB 19.5* 18.8* 17.3* 16.8  HCT 55.5* 53.8* 48.9 47.2  MCV 89.2 90.1 88.4 88.6  PLT 302 255 275 199   Basic Metabolic Panel: Recent Labs  Lab 07/14/24 2008 07/15/24 0038 07/15/24 0722 07/15/24 1139 07/15/24 1656 07/16/24 0417 07/17/24 0648  NA 135   < > 135 134* 134* 135 136  K 4.8   < > 3.9 3.9 3.7 3.9 3.8  CL 95*   < > 102 101 102 102 101  CO2 8*   < > 16* 18* 19* 16* 17*  GLUCOSE 190*   < > 150* 136* 159* 139* 129*  BUN 29*   < > 23* 20 19 13 13   CREATININE 1.55*   < > 1.21 1.12 1.13 1.02 0.96  CALCIUM  8.6*   < > 9.1 9.4 8.5* 7.9* 8.6*  MG 2.3  --   --  2.3  --   --   --   PHOS  --   --   --  2.4*  --   --   --    < > = values in this interval not displayed.   Liver Function Tests: Recent Labs  Lab  07/14/24 1422  AST 34  ALT 32  ALKPHOS 133*  BILITOT 0.6  PROT 8.8*  ALBUMIN 5.0   CBG: Recent Labs  Lab 07/16/24 1206 07/16/24 1656 07/16/24 2007 07/17/24 0818 07/17/24 1231  GLUCAP 193* 209* 135* 125* 133*    Discharge time spent: greater than 30 minutes.  Signed: Aimee Somerset, MD [facility] 07/17/2024 "

## 2024-07-17 NOTE — Plan of Care (Signed)
   Problem: Nutritional: Goal: Maintenance of adequate nutrition will improve Outcome: Progressing

## 2024-07-17 NOTE — Care Management Important Message (Signed)
 Important Message  Patient Details  Name: Dustin Williamson MRN: [id number] Date of Birth: 20-Dec-1970   Important Message Given:  Yes - Medicare IM     Shlomie Romig 07/17/2024, 12:14 PM

## 2024-07-17 NOTE — Anesthesia Preprocedure Evaluation (Addendum)
"                                    Anesthesia Evaluation  Patient identified by MRN, date of birth, ID band Patient awake    Reviewed: Allergy & Precautions, NPO status , Patient's Chart, lab work & pertinent test results  Airway Mallampati: II  TM Distance: >3 FB Neck ROM: full    Dental  (+) Missing, Poor Dentition, Loose   Pulmonary shortness of breath, asthma , sleep apnea , former smoker   Pulmonary exam normal  + decreased breath sounds      Cardiovascular Exercise Tolerance: Good hypertension, Pt. on medications Normal cardiovascular exam Rhythm:Regular     Neuro/Psych  Headaches  Anxiety     negative neurological ROS  negative psych ROS   GI/Hepatic negative GI ROS, Neg liver ROS,GERD  Medicated,,(+)     substance abuse  cocaine use  Endo/Other  diabetes, Oral Hypoglycemic Agents    Renal/GU negative Renal ROS  negative genitourinary   Musculoskeletal negative musculoskeletal ROS (+)    Abdominal   Peds negative pediatric ROS (+)  Hematology negative hematology ROS (+)   Anesthesia Other Findings Past Medical History: No date: Anxiety No date: Asthma No date: CHF (congestive heart failure) (HCC) No date: Depressed No date: Diabetes mellitus without complication (HCC) No date: Diastolic heart failure (HCC) No date: GERD (gastroesophageal reflux disease) No date: Heartburn No date: HLD (hyperlipidemia) No date: Hypertension No date: Kidney stone No date: Migraines No date: Neuropathy No date: Sleep apnea  Past Surgical History: 05/27/2016: CYSTOSCOPY W/ URETERAL STENT REMOVAL; N/A     Comment:  Procedure: CYSTOSCOPY WITH STENT REMOVAL;  Surgeon: Gordy VEAR Cramp, MD;  Location: [facility] ORS;  Service: Urology;                Laterality: N/A; 05/05/2016: CYSTOSCOPY WITH STENT PLACEMENT; Left     Comment:  Procedure: CYSTOSCOPY WITH STENT PLACEMENT;  Surgeon:               Rosina Riis, MD;  Location: [facility] ORS;  Service:                Urology;  Laterality: Left; No date: HERNIA REPAIR 2009: KNEE SURGERY; Left  BMI    Body Mass Index: 32.28 kg/m      Reproductive/Obstetrics negative OB ROS                              Anesthesia Physical Anesthesia Plan  ASA: 3  Anesthesia Plan: General   Post-op Pain Management:    Induction: Intravenous  PONV Risk Score and Plan: Propofol  infusion and TIVA  Airway Management Planned: Natural Airway and Nasal Cannula  Additional Equipment:   Intra-op Plan:   Post-operative Plan:   Informed Consent: I have reviewed the patients History and Physical, chart, labs and discussed the procedure including the risks, benefits and alternatives for the proposed anesthesia with the patient or authorized representative who has indicated his/her understanding and acceptance.     Dental Advisory Given  Plan Discussed with: CRNA  Anesthesia Plan Comments:          Anesthesia Quick Evaluation  "

## 2024-07-17 NOTE — Transfer of Care (Signed)
 Immediate Anesthesia Transfer of Care Note  Patient: Dustin Williamson  Procedure(s) Performed: EGD (ESOPHAGOGASTRODUODENOSCOPY)  Patient Location: Endoscopy Unit  Anesthesia Type:General  Level of Consciousness: awake, alert , and oriented  Airway & Oxygen Therapy: Patient Spontanous Breathing  Post-op Assessment: Report given to RN and Post -op Vital signs reviewed and stable  Post vital signs: Reviewed and stable  Last Vitals:  Vitals Value Taken Time  BP 115/91 07/17/24 15:05  Temp    Pulse 79 07/17/24 15:05  Resp 11 07/17/24 15:05  SpO2 95 % 07/17/24 15:05  Vitals shown include unfiled device data.  Last Pain:  Vitals:   07/17/24 1359  TempSrc: Temporal  PainSc: 0-No pain         Complications: No notable events documented.

## 2024-07-21 NOTE — Anesthesia Postprocedure Evaluation (Signed)
"   Anesthesia Post Note  Patient: Dustin Williamson  Procedure(s) Performed: EGD (ESOPHAGOGASTRODUODENOSCOPY)  Patient location during evaluation: PACU Anesthesia Type: General Level of consciousness: awake Pain management: pain level controlled Respiratory status: spontaneous breathing Cardiovascular status: stable Anesthetic complications: no   No notable events documented.   Last Vitals:  Vitals:   07/17/24 1525 07/17/24 1740  BP: 124/89 (!) 129/96  Pulse: 69 72  Resp: 11 16  Temp:  36.4 C  SpO2: 96% 97%    Last Pain:  Vitals:   07/17/24 1740  TempSrc: Oral  PainSc:                  VAN STAVEREN,Jennie Bolar      "
